# Patient Record
Sex: Male | Born: 1945 | Race: White | Hispanic: No | Marital: Married | Smoking: Former smoker
Health system: Southern US, Community
[De-identification: ages and names within clinical notes are randomized; demographics above are authoritative.]

## PROBLEM LIST (undated history)

## (undated) DIAGNOSIS — I10 Essential (primary) hypertension: Secondary | ICD-10-CM

## (undated) DIAGNOSIS — Z9289 Personal history of other medical treatment: Secondary | ICD-10-CM

## (undated) DIAGNOSIS — M109 Gout, unspecified: Secondary | ICD-10-CM

## (undated) DIAGNOSIS — I480 Paroxysmal atrial fibrillation: Secondary | ICD-10-CM

## (undated) DIAGNOSIS — E785 Hyperlipidemia, unspecified: Secondary | ICD-10-CM

## (undated) DIAGNOSIS — Z950 Presence of cardiac pacemaker: Secondary | ICD-10-CM

## (undated) DIAGNOSIS — I499 Cardiac arrhythmia, unspecified: Secondary | ICD-10-CM

## (undated) DIAGNOSIS — I509 Heart failure, unspecified: Secondary | ICD-10-CM

## (undated) HISTORY — PX: CARDIAC ELECTROPHYSIOLOGY STUDY AND ABLATION: SHX1294

## (undated) HISTORY — PX: TONSILLECTOMY: SUR1361

## (undated) HISTORY — PX: CARDIAC CATHETERIZATION: SHX172

## (undated) HISTORY — PX: LAPAROSCOPIC GASTRIC BYPASS: SUR771

## (undated) HISTORY — PX: LUMBAR LAMINECTOMY: SHX95

---

## 2014-07-03 DIAGNOSIS — I480 Paroxysmal atrial fibrillation: Secondary | ICD-10-CM | POA: Diagnosis present

## 2014-07-03 DIAGNOSIS — I10 Essential (primary) hypertension: Secondary | ICD-10-CM | POA: Diagnosis present

## 2016-03-14 ENCOUNTER — Encounter
Admission: RE | Admit: 2016-03-14 | Discharge: 2016-03-14 | Disposition: A | Discharge status: Home / Self Care | Payer: Medicare Other | Source: Ambulatory Visit | Attending: Cardiology | Admitting: Cardiology

## 2016-03-14 ENCOUNTER — Ambulatory Visit
Admission: RE | Admit: 2016-03-14 | Discharge: 2016-03-14 | Disposition: A | Discharge status: Home / Self Care | Payer: Medicare Other | Source: Ambulatory Visit | Attending: Cardiology | Admitting: Cardiology

## 2016-03-14 DIAGNOSIS — Z01818 Encounter for other preprocedural examination: Secondary | ICD-10-CM

## 2016-03-14 DIAGNOSIS — Z01812 Encounter for preprocedural laboratory examination: Secondary | ICD-10-CM | POA: Diagnosis present

## 2016-03-14 DIAGNOSIS — Z01810 Encounter for preprocedural cardiovascular examination: Secondary | ICD-10-CM | POA: Insufficient documentation

## 2016-03-14 DIAGNOSIS — R9431 Abnormal electrocardiogram [ECG] [EKG]: Secondary | ICD-10-CM | POA: Insufficient documentation

## 2016-03-14 HISTORY — DX: Essential (primary) hypertension: I10

## 2016-03-14 HISTORY — DX: Paroxysmal atrial fibrillation: I48.0

## 2016-03-14 HISTORY — DX: Hyperlipidemia, unspecified: E78.5

## 2016-03-14 HISTORY — DX: Gout, unspecified: M10.9

## 2016-03-14 LAB — DIFFERENTIAL
BASOS ABS: 0.1 10*3/uL (ref 0–0.1)
BASOS PCT: 1 %
Eosinophils Absolute: 0.2 10*3/uL (ref 0–0.7)
Eosinophils Relative: 3 %
Lymphocytes Relative: 25 %
Lymphs Abs: 1.4 10*3/uL (ref 1.0–3.6)
Monocytes Absolute: 0.5 10*3/uL (ref 0.2–1.0)
Monocytes Relative: 9 %
NEUTROS ABS: 3.6 10*3/uL (ref 1.4–6.5)
Neutrophils Relative %: 62 %

## 2016-03-14 LAB — SURGICAL PCR SCREEN
MRSA, PCR: NEGATIVE
Staphylococcus aureus: NEGATIVE

## 2016-03-14 LAB — CBC
HEMATOCRIT: 42.1 % (ref 40.0–52.0)
Hemoglobin: 14.1 g/dL (ref 13.0–18.0)
MCH: 37.5 pg — ABNORMAL HIGH (ref 26.0–34.0)
MCHC: 33.5 g/dL (ref 32.0–36.0)
MCV: 111.9 fL — ABNORMAL HIGH (ref 80.0–100.0)
PLATELETS: 158 10*3/uL (ref 150–440)
RBC: 3.76 MIL/uL — AB (ref 4.40–5.90)
RDW: 14.4 % (ref 11.5–14.5)
WBC: 5.7 10*3/uL (ref 3.8–10.6)

## 2016-03-14 LAB — BASIC METABOLIC PANEL
Anion gap: 5 (ref 5–15)
BUN: 27 mg/dL — AB (ref 6–20)
CALCIUM: 9.1 mg/dL (ref 8.9–10.3)
CO2: 27 mmol/L (ref 22–32)
CREATININE: 1.29 mg/dL — AB (ref 0.61–1.24)
Chloride: 110 mmol/L (ref 101–111)
GFR calc Af Amer: >60 mL/min (ref >60)
GFR, EST NON AFRICAN AMERICAN: 55 mL/min — AB (ref >60)
Glucose, Bld: 52 mg/dL — ABNORMAL LOW (ref 65–99)
Potassium: 4.5 mmol/L (ref 3.5–5.1)
SODIUM: 142 mmol/L (ref 135–145)

## 2016-03-14 LAB — PROTIME-INR
INR: 0.96
PROTHROMBIN TIME: 12.8 s (ref 11.4–15.2)

## 2016-03-14 LAB — APTT: APTT: 26 s (ref 24–36)

## 2016-03-14 NOTE — Patient Instructions (Signed)
Your procedure is scheduled on: Tuesday 03/22/16 Report to Day Surgery. 2ND FLOOR MEDICAL MALL ENTRANCE To find out your arrival time please call [phone] between 1PM - 3PM on Monday 03/21/16.  Remember: Instructions that are not followed completely may result in serious medical risk, up to and including death, or upon the discretion of your surgeon and anesthesiologist your surgery may need to be rescheduled.    __X__ 1. Do not eat food or drink liquids after midnight. No gum chewing or hard candies.     __X__ 2. No Alcohol for 24 hours before or after surgery.   ____ 3. Bring all medications with you on the day of surgery if instructed.    __X__ 4. Notify your doctor if there is any change in your medical condition     (cold, fever, infections).     Do not wear jewelry, make-up, hairpins, clips or nail polish.  Do not wear lotions, powders, or perfumes.   Do not shave 48 hours prior to surgery. Men may shave face and neck.  Do not bring valuables to the hospital.    [facility] is not responsible for any belongings or valuables.               Contacts, dentures or bridgework may not be worn into surgery.  Leave your suitcase in the car. After surgery it may be brought to your room.  For patients admitted to the hospital, discharge time is determined by your                treatment team.   Patients discharged the day of surgery will not be allowed to drive home.   Please read over the following fact sheets that you were given:   MRSA Information   __X__ Take these medicines the morning of surgery with A SIP OF WATER:    1. AMIODARONE  2. AMLODIPINE  3.   4.  5.  6.  ____ Fleet Enema (as directed)   __X__ Use CHG Soap as directed  ____ Use inhalers on the day of surgery  ____ Stop metformin 2 days prior to surgery    ____ Take 1/2 of usual insulin dose the night before surgery and none on the morning of surgery.   ____ Stop Coumadin/Plavix/aspirin on AS  INSTRUCTED BY DR PARASCHOS  __X__ Stop Anti-inflammatories on NO ADVIL UNTIL AFTER PROCEDURE   ____ Stop supplements until after surgery.  STOP SAW PALMETTO UNTIL AFTER PROCEDURE  ____ Bring C-Pap to the hospital.

## 2016-03-22 ENCOUNTER — Ambulatory Visit: Payer: Medicare Other | Admitting: Anesthesiology

## 2016-03-22 ENCOUNTER — Ambulatory Visit
Admission: RE | Admit: 2016-03-22 | Discharge: 2016-03-23 | Disposition: A | Discharge status: Home / Self Care | Payer: Medicare Other | Source: Ambulatory Visit | Attending: Cardiology | Admitting: Cardiology

## 2016-03-22 ENCOUNTER — Ambulatory Visit: Payer: Medicare Other

## 2016-03-22 ENCOUNTER — Observation Stay: Payer: Medicare Other

## 2016-03-22 ENCOUNTER — Encounter: Payer: Self-pay | Admitting: *Deleted

## 2016-03-22 ENCOUNTER — Encounter
Admission: RE | Disposition: A | Discharge status: Home / Self Care | Payer: Self-pay | Source: Ambulatory Visit | Attending: Cardiology

## 2016-03-22 DIAGNOSIS — Z9884 Bariatric surgery status: Secondary | ICD-10-CM | POA: Diagnosis not present

## 2016-03-22 DIAGNOSIS — Z7982 Long term (current) use of aspirin: Secondary | ICD-10-CM | POA: Insufficient documentation

## 2016-03-22 DIAGNOSIS — Z950 Presence of cardiac pacemaker: Secondary | ICD-10-CM

## 2016-03-22 DIAGNOSIS — I10 Essential (primary) hypertension: Secondary | ICD-10-CM | POA: Diagnosis not present

## 2016-03-22 DIAGNOSIS — E782 Mixed hyperlipidemia: Secondary | ICD-10-CM | POA: Insufficient documentation

## 2016-03-22 DIAGNOSIS — M109 Gout, unspecified: Secondary | ICD-10-CM | POA: Diagnosis not present

## 2016-03-22 DIAGNOSIS — R001 Bradycardia, unspecified: Secondary | ICD-10-CM | POA: Diagnosis present

## 2016-03-22 DIAGNOSIS — I480 Paroxysmal atrial fibrillation: Secondary | ICD-10-CM | POA: Insufficient documentation

## 2016-03-22 DIAGNOSIS — I495 Sick sinus syndrome: Secondary | ICD-10-CM | POA: Diagnosis not present

## 2016-03-22 DIAGNOSIS — F17210 Nicotine dependence, cigarettes, uncomplicated: Secondary | ICD-10-CM | POA: Diagnosis not present

## 2016-03-22 HISTORY — PX: PACEMAKER INSERTION: SHX728

## 2016-03-22 SURGERY — INSERTION, CARDIAC PACEMAKER
Anesthesia: Monitor Anesthesia Care

## 2016-03-22 MED ORDER — SIMVASTATIN 20 MG PO TABS
20.0000 mg | ORAL_TABLET | Freq: Every day | ORAL | Status: DC
  Administered 2016-03-22: 20 mg via ORAL
  Filled 2016-03-22: qty 1

## 2016-03-22 MED ORDER — AMIODARONE HCL 200 MG PO TABS
100.0000 mg | ORAL_TABLET | Freq: Every day | ORAL | Status: DC
  Filled 2016-03-22: qty 1
  Filled 2016-03-22: qty 0.5

## 2016-03-22 MED ORDER — MAGNESIUM OXIDE 400 (241.3 MG) MG PO TABS
400.0000 mg | ORAL_TABLET | Freq: Every day | ORAL | Status: DC
  Administered 2016-03-23: 400 mg via ORAL
  Filled 2016-03-22: qty 1

## 2016-03-22 MED ORDER — AMLODIPINE BESYLATE 5 MG PO TABS
5.0000 mg | ORAL_TABLET | Freq: Every day | ORAL | Status: DC
  Administered 2016-03-23: 5 mg via ORAL
  Filled 2016-03-22: qty 1

## 2016-03-22 MED ORDER — SODIUM CHLORIDE 0.9 % IR SOLN
Freq: Once | Status: DC
  Filled 2016-03-22: qty 2

## 2016-03-22 MED ORDER — SODIUM CHLORIDE 0.9 % IR SOLN
Status: DC | PRN
  Administered 2016-03-22: 2 mL

## 2016-03-22 MED ORDER — ACETAMINOPHEN 325 MG PO TABS
325.0000 mg | ORAL_TABLET | ORAL | Status: DC | PRN
  Administered 2016-03-22 – 2016-03-23 (×3): 650 mg via ORAL
  Filled 2016-03-22 (×3): qty 2

## 2016-03-22 MED ORDER — LIDOCAINE 1 % OPTIME INJ - NO CHARGE
INTRAMUSCULAR | Status: DC | PRN
  Administered 2016-03-22: 30 mL via INTRADERMAL

## 2016-03-22 MED ORDER — LACTATED RINGERS IV SOLN
INTRAVENOUS | Status: DC
  Administered 2016-03-22: 75 mL/h via INTRAVENOUS
  Administered 2016-03-22: 15:00:00 via INTRAVENOUS

## 2016-03-22 MED ORDER — ONDANSETRON HCL 4 MG/2ML IJ SOLN: 4.0000 mg | Freq: Once | INTRAMUSCULAR | Status: DC | PRN

## 2016-03-22 MED ORDER — PROPOFOL 500 MG/50ML IV EMUL
INTRAVENOUS | Status: DC | PRN
  Administered 2016-03-22: 75 ug/kg/min via INTRAVENOUS

## 2016-03-22 MED ORDER — CEFAZOLIN IN D5W 1 GM/50ML IV SOLN
INTRAVENOUS | Status: AC
  Filled 2016-03-22: qty 50

## 2016-03-22 MED ORDER — FENTANYL CITRATE (PF) 100 MCG/2ML IJ SOLN
INTRAMUSCULAR | Status: DC | PRN
  Administered 2016-03-22: 25 ug via INTRAVENOUS
  Administered 2016-03-22: 50 ug via INTRAVENOUS
  Administered 2016-03-22: 25 ug via INTRAVENOUS

## 2016-03-22 MED ORDER — CEFAZOLIN IN D5W 1 GM/50ML IV SOLN: 1.0000 g | Freq: Once | INTRAVENOUS | Status: DC

## 2016-03-22 MED ORDER — FENTANYL CITRATE (PF) 100 MCG/2ML IJ SOLN: 25.0000 ug | INTRAMUSCULAR | Status: DC | PRN

## 2016-03-22 MED ORDER — ONDANSETRON HCL 4 MG/2ML IJ SOLN: 4.0000 mg | Freq: Four times a day (QID) | INTRAMUSCULAR | Status: DC | PRN

## 2016-03-22 MED ORDER — PROPOFOL 10 MG/ML IV BOLUS
INTRAVENOUS | Status: DC | PRN
  Administered 2016-03-22: 20 mg via INTRAVENOUS
  Administered 2016-03-22: 40 mg via INTRAVENOUS

## 2016-03-22 MED ORDER — MIDAZOLAM HCL 2 MG/2ML IJ SOLN
INTRAMUSCULAR | Status: DC | PRN
  Administered 2016-03-22: 2 mg via INTRAVENOUS

## 2016-03-22 MED ORDER — SODIUM CHLORIDE FLUSH 0.9 % IV SOLN
INTRAVENOUS | Status: AC
  Administered 2016-03-22: 3 mL
  Filled 2016-03-22: qty 3

## 2016-03-22 MED ORDER — GENTAMICIN SULFATE 40 MG/ML IJ SOLN
INTRAMUSCULAR | Status: AC
  Filled 2016-03-22: qty 2

## 2016-03-22 MED ORDER — FAMOTIDINE 20 MG PO TABS
ORAL_TABLET | ORAL | Status: AC
  Administered 2016-03-22: 20 mg
  Filled 2016-03-22: qty 1

## 2016-03-22 MED ORDER — LACTATED RINGERS IV SOLN
INTRAVENOUS | Status: DC | PRN
  Administered 2016-03-22: 12:00:00 via INTRAVENOUS

## 2016-03-22 MED ORDER — SODIUM CHLORIDE FLUSH 0.9 % IV SOLN
INTRAVENOUS | Status: AC
  Filled 2016-03-22: qty 10

## 2016-03-22 MED ORDER — FAMOTIDINE 20 MG PO TABS: 20.0000 mg | ORAL_TABLET | Freq: Once | ORAL | Status: DC

## 2016-03-22 MED ORDER — CEFAZOLIN IN D5W 1 GM/50ML IV SOLN
1.0000 g | Freq: Four times a day (QID) | INTRAVENOUS | Status: AC
Start: 2016-03-22 — End: 2016-03-23
  Administered 2016-03-22 – 2016-03-23 (×3): 1 g via INTRAVENOUS
  Filled 2016-03-22 (×3): qty 50

## 2016-03-22 MED ORDER — LISINOPRIL 20 MG PO TABS
40.0000 mg | ORAL_TABLET | Freq: Every day | ORAL | Status: DC
  Administered 2016-03-22 – 2016-03-23 (×2): 40 mg via ORAL
  Filled 2016-03-22 (×2): qty 2

## 2016-03-22 SURGICAL SUPPLY — 36 items
BAG DECANTER FOR FLEXI CONT (MISCELLANEOUS) ×2 IMPLANT
BRUSH SCRUB 4% CHG (MISCELLANEOUS) ×2 IMPLANT
CABLE SURG 12 DISP A/V CHANNEL (MISCELLANEOUS) ×2 IMPLANT
CANISTER SUCT 1200ML W/VALVE (MISCELLANEOUS) ×2 IMPLANT
CHLORAPREP W/TINT 26ML (MISCELLANEOUS) ×2 IMPLANT
COVER LIGHT HANDLE STERIS (MISCELLANEOUS) ×4 IMPLANT
COVER MAYO STAND STRL (DRAPES) ×2 IMPLANT
DEVICE DISSECT PLASMABLAD 3.0S (MISCELLANEOUS) IMPLANT
DRAPE C-ARM XRAY 36X54 (DRAPES) ×2 IMPLANT
DRESSING TELFA 4X3 1S ST N-ADH (GAUZE/BANDAGES/DRESSINGS) ×2 IMPLANT
DRSG TEGADERM 4X4.75 (GAUZE/BANDAGES/DRESSINGS) ×2 IMPLANT
ELECT REM PT RETURN 9FT ADLT (ELECTROSURGICAL) ×2
ELECTRODE REM PT RTRN 9FT ADLT (ELECTROSURGICAL) ×1 IMPLANT
GLIDEWIRE STIFF .35X180X3 HYDR (WIRE) ×2 IMPLANT
GLOVE BIO SURGEON STRL SZ7.5 (GLOVE) ×2 IMPLANT
GLOVE BIO SURGEON STRL SZ8 (GLOVE) ×2 IMPLANT
GOWN STRL REUS W/ TWL LRG LVL3 (GOWN DISPOSABLE) ×1 IMPLANT
GOWN STRL REUS W/ TWL XL LVL3 (GOWN DISPOSABLE) ×1 IMPLANT
GOWN STRL REUS W/TWL LRG LVL3 (GOWN DISPOSABLE) ×1
GOWN STRL REUS W/TWL XL LVL3 (GOWN DISPOSABLE) ×1
IMMOBILIZER SHDR MD LX WHT (SOFTGOODS) ×2 IMPLANT
IMMOBILIZER SHDR XL LX WHT (SOFTGOODS) IMPLANT
INTRO PACEMKR SHEATH II 7FR (MISCELLANEOUS) ×2
INTRODUCER PACEMKR SHTH II 7FR (MISCELLANEOUS) ×1 IMPLANT
IV NS 500ML (IV SOLUTION) ×1
IV NS 500ML BAXH (IV SOLUTION) ×1 IMPLANT
KIT RM TURNOVER STRD PROC AR (KITS) ×2 IMPLANT
LABEL OR SOLS (LABEL) ×2 IMPLANT
LEAD CAPSURE NOVUS 5076-52CM (Lead) ×2 IMPLANT
LEAD CAPSURE NOVUS 5076-58CM (Lead) ×2 IMPLANT
MARKER SKIN DUAL TIP RULER LAB (MISCELLANEOUS) ×2 IMPLANT
PACK PACE INSERTION (MISCELLANEOUS) ×2 IMPLANT
PAD ONESTEP ZOLL R SERIES ADT (MISCELLANEOUS) ×2 IMPLANT
PLASMABLADE 3.0S (MISCELLANEOUS)
PPM ADVISA MRI DR A2DR01 (Pacemaker) ×2 IMPLANT
SUT SILK 0 SH 30 (SUTURE) ×6 IMPLANT

## 2016-03-22 NOTE — H&P (Signed)
<6>29299-5<7>Encounter Details<6>46240-8<7>Social History<6>29762-2<7>Instructions<6>69730-0<7>Progress Notes<6>10164-2<7>Plan of Treatment<6>18776-5<7>Visit Diagnoses<6>51848-0<7>/"> Jump to Section ? Document InformationEncounter DetailsInstructionsPatient DemographicsPlan of TreatmentProgress NotesReason for VisitSocial HistoryVisit Diagnoses Mike Townsend Encounter Summary, generated on Nov. 06, 2017 Printout Information  Document Contents Initial consult Document Received Date Nov. 06, 2017 Document Source Organization [facility]   Patient Demographics - 70 y.o. Male, born 06-17-1945   Patient Address Communication Language Race / Ethnicity  [address] [place], [place] [id number] [facility]) [phone] (Home) [email or web address] English (Preferred) White / Not Hispanic or Latino  Reason for Visit    Reason Comments  Establish Care Per Paul Half ABN Holter  Shortness of Breath with or without excertion sometime  Edema with standing all day his normal  Dizziness pre syncope-when walking  Fatigue heart rate slow    Consultation (Routine) Consultation (Routine)  Status Reason Specialty Diagnoses / Procedures Referred By Contact Referred To Contact  Pending Review  Cardiovascular Disease / Cardiology Diagnoses  Symptomatic bradycardia   Carlynn Purl, MD  [address]  [facility]  [place], [place] [address]  Phone: [phone]  Fax: [phone]  [facility] - Cardiology  [address]  [place], [place] [address]  Phone: [phone]  Fax: [phone]      Encounter Details    Date Type Department Care Team Description  03/03/2016 Initial consult [facility]  [address]  [place], [place] [address]  [phone]  Denton Ar, MD  [address]  [facility] [address] - CARDIOLOGY  [place], [place] [address]  [phone]    [phone] (Fax)    Marcina Millard, MD  [address]  [facility]  [place], [place] [address]  [phone]  [phone] (Fax)  Paroxysmal atrial fibrillation (CMS-HCC) (Primary Dx);  Essential hypertension;  Hyperlipidemia, mixed;  Sinus bradycardia   Social History - as of this encounter   Tobacco Use Types Packs/Day Years Used Date  Current Some Day Smoker Pipe     Smokeless Tobacco: Never Used      Alcohol Use Drinks/Week oz/Week Comments  No 0 Standard drinks or equivalent  0.0     Sex Assigned at [employer] Date Recorded  Not on file    Instructions - in this encounter   Patient Instructions - Marcina Millard, MD - 03/03/2016 10:00 AM EDT Pacemaker Implantation The heart has its own electrical system, or natural pacemaker, to regulate the heartbeat. Sometimes, the natural pacemaker system of the heart fails and causes the heart to beat too slowly. If this happens, a pacemaker can be surgically placed to help the heart beat at a normal or programmed rate. A pacemaker is a small, battery-powered device that is placed under the skin and is programmed to sense your heartbeats. If your heart rate is lower than the programmed rate, the pacemaker will pace your heart. Parts of a pacemaker include:  Wires or leads. The leads are placed in the heart and transmit electricity to the heart. The leads are connected to the pulse generator.  Pulse generator. The pulse generator contains a computer and a memory system. The pulse generator also produces the electrical signal that triggers the heart to beat. A pacemaker may be placed if:  You have a slow heartbeat (bradycardia).  You have fainting (syncope).  Shortness of breath (dyspnea) due to heart problems. LET [facility] CARE PROVIDER KNOW ABOUT:  Any allergies you may have.  All medicines you are taking, including vitamins, herbs, eye drops, creams, and  over-the-counter  medicines.  Previous problems you or members of your family have had with the use of anesthetics.  Any blood disorders you have.  Previous surgeries you have had.  Medical conditions you have.  Possibility of pregnancy, if this applies. RISKS AND COMPLICATIONS Generally, pacemaker implantation is a safe procedure. However, problems can occur and include:  Bleeding.  Unable to place the pacemaker under local sedation.  Infection. BEFORE THE PROCEDURE  You will have blood work drawn before the procedure.  Do not use any tobacco products including cigarettes, chewing tobacco, or electronic cigarettes. If you need help quitting, ask your health care provider.  Do not eat or drink anything after midnight on the night before the procedure or as directed by your health care provider.  Ask your health care provider about:  Changing or stopping your regular medicines. This is especially important if you are taking diabetes medicines or blood thinners.  Taking medicines such as aspirin and ibuprofen. These medicines can thin your blood. Do not take these medicines before your procedure if your health care provider asks you not to.  Ask your health care provider if you can take a sip of water with any approved medicines the morning of the procedure. PROCEDURE  The surgery to place a pacemaker is considered a minimally invasive surgical procedure. It is done under a local anesthetic, which is an injection at the incision site that makes the skin numb. You are also given sedation and pain medicine that makes you drowsy during the procedure.   An intravenous line (IV) will be started in your hand or arm so sedation and pain medicine can be given during the pacemaker procedure.  A numbing medicine will be injected into the skin where the pacemaker is to be placed. A small incision will then be made into the skin. The pacemaker is usually placed under the skin near the collarbone.  After the  incision has been made, the leads will be inserted into a large vein and guided into the heart using X-ray.  Using the same incision that was used to place the leads, a small pocket will be created under the skin to hold the pulse generator. The leads will then be connected to the pulse generator.  The incision site will then be closed. A bandage (dressing) is placed over the pacemaker site. The dressing is removed 24-48 hours afterward. AFTER THE PROCEDURE  You will be taken to a recovery area after the pacemaker implant. Your vital signs such as blood pressure, heart rate, breathing, and oxygen levels will be monitored.  A chest X-ray will be done after the pacemaker has been implanted. This is to make sure the pacemaker and leads are in the correct place.  This information is not intended to replace advice given to you by your health care provider. Make sure you discuss any questions you have with your health care provider.  Document Released: 04/15/2002 Document Revised: 05/16/2014 Document Reviewed: 08/30/2011 Elsevier Interactive Patient Education 2017 [employer]     Progress Notes - in this encounter   Marcina MillardParaschos, Lysa Livengood, MD - 03/03/2016 10:00 AM EDT Formatting of this note may be different from the original. New Patient Visit   Chief Complaint: Chief Complaint  Patient presents with  . Establish Care  Per Paul HalfM Miller ABN Holter  . Shortness of Breath  with or without excertion sometime  . Edema  with standing all day his normal  . Dizziness  pre syncope-when walking  .  Fatigue  heart rate slow  Date of Service: 03/03/2016 Date of Birth: [birth year] PCP: Danella Penton, MD  History of Present Illness: Mr. Rote is a 70 y.o.male patient who referred for symptomatic bradycardia, history of paroxysmal atrial fibrillation, essential hypertension and hyperlipidemia. Patient reports a history of paroxysmal atrial fibrillation diagnosed approximately 25 years ago, on  chronic amiodarone therapy. He reports a 6-12 month history of worsening exertional dyspnea, intermittent dizziness, and generalized fatigue 24-hour Holter monitor 02/03/2016 revealed predominant sinus bradycardia, with mean heart rate of 48 bpm, with occasional premature atrial contractions. Amiodarone was decreased from 200 to 100 mg approximately 2 weeks ago without any clinical improvement.  The patient has essential hypertension, blood pressure well controlled, on lisinopril and amlodipine, which are well tolerated without apparent side effects. The patient follows a low-sodium, no added salt diet  The patient has hyperlipidemia, on simvastatin, which is well tolerated without apparent side effects, followed by his primary care provider. The patient follows a low-cholesterol, low-fat diet.  Past Medical and Surgical History  Past Medical History Past Medical History:  Diagnosis Date  . Chronic gouty arthropathy without tophi 07/03/2014  . Essential hypertension 07/03/2014  . Hyperlipidemia, mixed 07/03/2014  . Paroxysmal atrial fibrillation (CMS-HCC) 07/03/2014   Past Surgical History He has a past surgical history that includes laminectomy lumbar spine and Laparoscopic gastric bypass.   Medications and Allergies  Current Medications Current Outpatient Prescriptions  Medication Sig Dispense Refill  . allopurinol (ZYLOPRIM) 300 MG tablet TAKE 1 TABLET BY MOUTH ONCE DAILY. 30 tablet 11  . AMIOdarone (PACERONE) 200 MG tablet Take 100 mg by mouth once daily.  Marland Kitchen amLODIPine (NORVASC) 5 MG tablet Take 1 tablet (5 mg total) by mouth once daily. 30 tablet 11  . aspirin 81 MG EC tablet Take 81 mg by mouth once daily.  Marland Kitchen lisinopril (PRINIVIL,ZESTRIL) 40 MG tablet Take 20 mg by mouth nightly.  . magnesium oxide (MAG-OX) 400 mg tablet Take 400 mg by mouth once daily.  . simvastatin (ZOCOR) 20 MG tablet TAKE 1 TABLET BY MOUTH AT BEDTIME 30 tablet 11   No current facility-administered medications for  this visit.   Allergies Review of patient's allergies indicates no known allergies.  Social and Family History  Social History reports that he has been smoking Pipe. He has never used smokeless tobacco. He reports that he does not drink alcohol or use drugs.  Family History family history includes Coronary artery disease in his mother; No Known Problems in his father.   Review of Systems   Review of Systems: The patient denies chest pain, reports progressive exertional shortness of breath, without orthopnea, paroxysmal nocturnal dyspnea, pedal edema, palpitations, heart racing, with presyncope, without syncope. Review of 12 Systems is negative except as described above.  Physical Examination   Vitals:BP (P) 138/62  Pulse (!) (P) 48  Ht (P) 188 cm (6\' 2" )  Wt (!) (P) 109.3 kg (241 lb)  BMI (P) 30.94 kg/m  Ht:(P) 188 cm (6\' 2" ) Wt:(!) (P) 109.3 kg (241 lb) GEX:BMWU surface area is 2.39 meters squared (pended). Body mass index is 30.94 kg/m (pended).  HEENT: Pupils equally reactive to light and accomodation  Neck: Supple without thyromegaly, carotid pulses 2+ Lungs: clear to auscultation bilaterally; no wheezes, rales, rhonchi Heart: Regular rate and rhythm. No gallops, murmurs or rub Abdomen: soft nontender, nondistended, with normal bowel sounds Extremities: no cyanosis, clubbing, or edema Peripheral Pulses: 2+ in all extremities, 2+ femoral pulses bilaterally  Assessment  70 y.o. male with  1. Paroxysmal atrial fibrillation (CMS-HCC)  2. Essential hypertension  3. Hyperlipidemia, mixed  4. Sinus bradycardia   70 year old gentleman with symptomatic bradycardia, with progressive exertional dyspnea, postural dizziness, and generalized fatigue, with mean heart rate of 48 bpm on Holter monitor, with history of paroxysmal atrial fibrillation, consistent with sick sinus syndrome, and tachybradycardia syndrome. Patient has essential hypertension, blood pressure well controlled on  current BP medications.  Plan   1. Continue current medications 2. Counseled patient about low-sodium diet 3. DASH diet printed instructions given to the patient 4. Counseled patient about low-cholesterol diet 5. Continue simvastatin for hyperlipidemia management  6. Low-fat and cholesterol diet printed instructions given to the patient 7. Continue amiodarone 100 mg daily 8. Proceed with dual-chamber pacemaker implantation. We discussed the risks, benefits alternatives of permanent pacemaker implantation, and informed consent was obtained.  No orders of the defined types were placed in this encounter.  Return in about 2 weeks (around 03/17/2016), or after pacemaker.  Marcina MillardALEXANDER Audrick Lamoureaux, MD      Plan of Treatment - as of this encounter   Upcoming Encounters Upcoming Encounters  Date Type Specialty Care Team Description  07/07/2016 Ancillary Orders Lab Carlynn PurlMiller, Mark Frederic, MD  [address]  [facility]  [place], [place] [address]  [phone]  [phone] (Fax)    07/14/2016 Office Visit Internal Medicine Carlynn PurlMiller, Mark Frederic, MD  [address]  [facility]  [place], [place] [address]  [phone]  [phone] (Fax)     Visit Diagnoses    Diagnosis  Paroxysmal atrial fibrillation (CMS-HCC) - Primary  Atrial fibrillation   Essential hypertension  Hyperlipidemia, mixed  Mixed hyperlipidemia   Sinus bradycardia  Other specified cardiac dysrhythmias    Images Document Information   Service Providers Document Coverage Dates Oct. 26, 2017  Custodian Organization [facility] [phone] (Work) [place], [place] [id number]   Encounter Providers Marcina MillardAlexander Diamonds Lippard MD (Attending) tel:[phone] (Work) [phone] [address] [facility] [place], [place] [address]   Encounter Date Oct. 26, 2017   Show All Sections

## 2016-03-22 NOTE — Care Management (Signed)
Elective pacemaker insertion.  No complications. outpatient in bed. Do not anticipate any discharge needs.

## 2016-03-22 NOTE — Op Note (Signed)
[facility]   03/22/2016                     1:23 PM  PATIENT:  Mike Townsend    PRE-OPERATIVE DIAGNOSIS:  Paroxysmal atrial fibrillation and sinus bradycardia  POST-OPERATIVE DIAGNOSIS:  Same  PROCEDURE:  INSERTION PACEMAKER  SURGEON:  Marcina MillardAlexander Danaka Llera, MD    ANESTHESIA:     PREOPERATIVE INDICATIONS:  Mike Townsend is a  70 y.o. male with a diagnosis of Paroxysmal atrial fibrillation and sinus bradycardia who failed conservative measures and elected for surgical management.    The risks benefits and alternatives were discussed with the patient preoperatively including but not limited to the risks of infection, bleeding, cardiopulmonary complications, the need for revision surgery, among others, and the patient was willing to proceed.   OPERATIVE PROCEDURE: The patient was brought to the operating room the fasting state. The left pectoral region was prepped and draped in the usual sterile manner. Anesthesia was obtained 1% lidocaine locally. A 6 cm incision was performed over the left pectoral region. The pacemaker pocket was generated by electrocautery and blunt dissection. Access was obtained to left subclavian vein by fine needle aspiration. MRI compatible leads were positioned into the right ventricular apical septum and right atrial appendage and fluoroscopic guidance. After proper thresholds were obtained the leads were sutured in place. The pacemaker pocket was irrigated with gentamicin solution. The leads were connected to a MRI compatible dual-chamber pacemaker generator (Medtronic A2 DRO 1), and positioned into the pocket. The pocket was closed with 2-0 and 4-0 Vicryl, respectively. Steri-Strips and a pressure dressing were applied.

## 2016-03-22 NOTE — Anesthesia Preprocedure Evaluation (Signed)
Anesthesia Evaluation  Patient identified by MRN, date of birth, ID band Patient awake    Reviewed: Allergy & Precautions, NPO status , Patient's Chart, lab work & pertinent test results  Airway Mallampati: II       Dental  (+) Teeth Intact   Pulmonary neg pulmonary ROS, Current Smoker,    breath sounds clear to auscultation       Cardiovascular Exercise Tolerance: Good hypertension, Pt. on medications + dysrhythmias Atrial Fibrillation  Rhythm:Regular Rate:Normal     Neuro/Psych    GI/Hepatic negative GI ROS, Neg liver ROS,   Endo/Other  negative endocrine ROS  Renal/GU negative Renal ROS     Musculoskeletal negative musculoskeletal ROS (+)   Abdominal   Peds  Hematology negative hematology ROS (+)   Anesthesia Other Findings   Reproductive/Obstetrics                             Anesthesia Physical Anesthesia Plan  ASA: II  Anesthesia Plan: MAC   Post-op Pain Management:    Induction: Intravenous  Airway Management Planned: Natural Airway and Nasal Cannula  Additional Equipment:   Intra-op Plan:   Post-operative Plan:   Informed Consent: I have reviewed the patients History and Physical, chart, labs and discussed the procedure including the risks, benefits and alternatives for the proposed anesthesia with the patient or authorized representative who has indicated his/her understanding and acceptance.     Plan Discussed with: CRNA  Anesthesia Plan Comments:         Anesthesia Quick Evaluation

## 2016-03-22 NOTE — Transfer of Care (Signed)
Immediate Anesthesia Transfer of Care Note  Patient: Mike Townsend  Procedure(s) Performed: Procedure(s): INSERTION PACEMAKER (N/A)  Patient Location: PACU  Anesthesia Type:General  Level of Consciousness: sedated  Airway & Oxygen Therapy: Patient Spontanous Breathing and Patient connected to face mask oxygen  Post-op Assessment: Report given to RN and Post -op Vital signs reviewed and stable  Post vital signs: Reviewed and stable  Last Vitals:  Vitals:   03/22/16 1337 03/22/16 1345  BP: 133/81   Pulse: 60 61  Resp: (!) 0 16  Temp:      Complications: No apparent anesthesia complications

## 2016-03-23 DIAGNOSIS — I495 Sick sinus syndrome: Secondary | ICD-10-CM | POA: Diagnosis not present

## 2016-03-23 MED ORDER — CEPHALEXIN 250 MG PO CAPS: 250.0000 mg | ORAL_CAPSULE | Freq: Four times a day (QID) | ORAL | 0 refills | Status: DC

## 2016-03-23 NOTE — Discharge Summary (Signed)
Physician Discharge Summary  Patient ID: Mike Townsend Dick MRN: [id number] DOB/AGE: Oct 28, 1945 70 y.o.  Admit date: 03/22/2016 Discharge date: 03/23/2016  Primary Discharge Diagnosis Sick sinus syndrome Secondary Discharge Diagnosis atrial fibrillation  Significant Diagnostic Studies: yes  Consults: None  Hospital Course: The patient underwent elective dual-chamber pacemaker implantation on 03/22/2016. Patient was returned to telemetry where he had an uncomplicated hospital course. The patient was ambulating without difficulty and was discharged home.   Discharge Exam: Blood pressure (!) 143/78, pulse (!) 58, temperature 97.7 F (36.5 C), temperature source Oral, resp. rate 18, height 6\' 2"  (1.88 m), weight 108.9 kg (240 lb), SpO2 100 %.   General appearance: alert Head: Normocephalic, without obvious abnormality, atraumatic Eyes: conjunctivae/corneas clear. PERRL, EOM's intact. Fundi benign. Ears: normal TM's and external ear canals both ears Nose: Nares normal. Septum midline. Mucosa normal. No drainage or sinus tenderness. Throat: lips, mucosa, and tongue normal; teeth and gums normal Neck: no adenopathy, no carotid bruit, no JVD, supple, symmetrical, trachea midline and thyroid not enlarged, symmetric, no tenderness/mass/nodules Back: symmetric, no curvature. ROM normal. No CVA tenderness. Resp: clear to auscultation bilaterally Chest wall: no tenderness Cardio: regular rate and rhythm, S1, S2 normal, no murmur, click, rub or gallop GI: soft, non-tender; bowel sounds normal; no masses,  no organomegaly Pelvic: cervix normal in appearance, external genitalia normal, no adnexal masses or tenderness, no cervical motion tenderness, rectovaginal septum normal, uterus normal size, shape, and consistency and vagina normal without discharge Extremities: extremities normal, atraumatic, no cyanosis or edema Pulses: 2+ and symmetric Skin: Skin color, texture, turgor normal. No rashes or  lesions Neurologic: Grossly normal Labs:   Lab Results  Component Value Date   WBC 5.7 03/14/2016   HGB 14.1 03/14/2016   HCT 42.1 03/14/2016   MCV 111.9 (H) 03/14/2016   PLT 158 03/14/2016   No results for input(s): NA, K, CL, CO2, BUN, CREATININE, CALCIUM, PROT, BILITOT, ALKPHOS, ALT, AST, GLUCOSE in the last 168 hours.  Invalid input(s): LABALBU    Radiology: No pnemothorax EKG:   FOLLOW UP PLANS AND APPOINTMENTS    Medication List    TAKE these medications   allopurinol 300 MG tablet Commonly known as:  ZYLOPRIM Take 300 mg by mouth daily.   amiodarone 200 MG tablet Commonly known as:  PACERONE Take 100 mg by mouth daily.   amLODipine 5 MG tablet Commonly known as:  NORVASC Take 5 mg by mouth daily.   aspirin EC 81 MG tablet Take 81 mg by mouth daily.   cephALEXin 250 MG capsule Commonly known as:  KEFLEX Take 1 capsule (250 mg total) by mouth 4 (four) times daily.   ibuprofen 200 MG tablet Commonly known as:  ADVIL,MOTRIN Take 200-400 mg by mouth every 8 (eight) hours as needed (for pain.).   lisinopril 40 MG tablet Commonly known as:  PRINIVIL,ZESTRIL Take 40 mg by mouth at bedtime.   magnesium oxide 400 MG tablet Commonly known as:  MAG-OX Take 400 mg by mouth daily.   SAW PALMETTO PO Take 1 capsule by mouth daily.   simvastatin 20 MG tablet Commonly known as:  ZOCOR Take 20 mg by mouth at bedtime.      Follow-up Information    Marcina MillardAlexander Elisabeth Strom, MD Follow up in 10 day(s).   Specialty:  Cardiology Contact information: [address] [facility] [place] [address] [phone]           BRING ALL MEDICATIONS WITH YOU TO FOLLOW UP APPOINTMENTS  Time spent with patient to include physician time: 25 min Signed:  Marcina MillardAlexander Rashon Westrup MD, PhD, [facility] 03/23/2016, 8:00 AM

## 2016-03-23 NOTE — Progress Notes (Signed)
Discharge teaching given to pt. And spouse at bedside. IV removed x2 without diffiulty via student nurse with this nurse at bedside. Prescription given to spouse. Pt. States he will take amiodarone dose when he gets home, nurse waiting for dose to arrive from pharmacy.

## 2016-03-23 NOTE — Discharge Instructions (Signed)
May remove outer dressing 03/24/2016. Leave Steri-Strips. May shower 03/24/2016. Do not lift left arm above head.

## 2016-03-23 NOTE — Anesthesia Postprocedure Evaluation (Signed)
Anesthesia Post Note  Patient: [name]  Procedure(s) Performed: Procedure(s) (LRB): INSERTION PACEMAKER (N/A)  Patient location during evaluation: PACU Anesthesia Type: MAC Level of consciousness: awake Pain management: pain level controlled Vital Signs Assessment: post-procedure vital signs reviewed and stable Respiratory status: spontaneous breathing Cardiovascular status: stable Anesthetic complications: no    Last Vitals:  Vitals:   03/22/16 1955 03/23/16 0559  BP: 125/72 (!) 143/78  Pulse: (!) 59 (!) 58  Resp: 18 18  Temp: 36.7 C 36.5 C    Last Pain:  Vitals:   03/23/16 0559  TempSrc: Oral  PainSc:                  VAN Townsend,Mike Fait

## 2018-10-19 ENCOUNTER — Other Ambulatory Visit
Admission: RE | Admit: 2018-10-19 | Discharge: 2018-10-19 | Disposition: A | Discharge status: Home / Self Care | Payer: Medicare HMO | Source: Ambulatory Visit | Attending: Physician Assistant | Admitting: Physician Assistant

## 2018-10-19 ENCOUNTER — Other Ambulatory Visit: Payer: Self-pay

## 2018-10-19 DIAGNOSIS — Z1159 Encounter for screening for other viral diseases: Secondary | ICD-10-CM | POA: Diagnosis not present

## 2018-10-19 DIAGNOSIS — Z01812 Encounter for preprocedural laboratory examination: Secondary | ICD-10-CM | POA: Diagnosis present

## 2018-10-20 ENCOUNTER — Other Ambulatory Visit: Payer: Self-pay

## 2018-10-20 ENCOUNTER — Encounter: Payer: Self-pay | Admitting: *Deleted

## 2018-10-20 ENCOUNTER — Inpatient Hospital Stay
Admission: EM | Admit: 2018-10-20 | Discharge: 2018-10-25 | DRG: 281 | Disposition: A | Discharge status: Home / Self Care | Payer: Medicare HMO | Attending: Internal Medicine | Admitting: Internal Medicine

## 2018-10-20 ENCOUNTER — Emergency Department: Payer: Medicare HMO

## 2018-10-20 DIAGNOSIS — I25110 Atherosclerotic heart disease of native coronary artery with unstable angina pectoris: Secondary | ICD-10-CM | POA: Diagnosis present

## 2018-10-20 DIAGNOSIS — I495 Sick sinus syndrome: Secondary | ICD-10-CM | POA: Diagnosis present

## 2018-10-20 DIAGNOSIS — F17200 Nicotine dependence, unspecified, uncomplicated: Secondary | ICD-10-CM | POA: Diagnosis present

## 2018-10-20 DIAGNOSIS — Z8249 Family history of ischemic heart disease and other diseases of the circulatory system: Secondary | ICD-10-CM

## 2018-10-20 DIAGNOSIS — Z7901 Long term (current) use of anticoagulants: Secondary | ICD-10-CM

## 2018-10-20 DIAGNOSIS — I482 Chronic atrial fibrillation, unspecified: Secondary | ICD-10-CM | POA: Diagnosis present

## 2018-10-20 DIAGNOSIS — Z9289 Personal history of other medical treatment: Secondary | ICD-10-CM

## 2018-10-20 DIAGNOSIS — N183 Chronic kidney disease, stage 3 (moderate): Secondary | ICD-10-CM | POA: Diagnosis present

## 2018-10-20 DIAGNOSIS — R079 Chest pain, unspecified: Secondary | ICD-10-CM | POA: Diagnosis not present

## 2018-10-20 DIAGNOSIS — I313 Pericardial effusion (noninflammatory): Secondary | ICD-10-CM | POA: Diagnosis present

## 2018-10-20 DIAGNOSIS — D631 Anemia in chronic kidney disease: Secondary | ICD-10-CM | POA: Diagnosis present

## 2018-10-20 DIAGNOSIS — E538 Deficiency of other specified B group vitamins: Secondary | ICD-10-CM | POA: Diagnosis present

## 2018-10-20 DIAGNOSIS — D539 Nutritional anemia, unspecified: Secondary | ICD-10-CM | POA: Diagnosis present

## 2018-10-20 DIAGNOSIS — R9430 Abnormal result of cardiovascular function study, unspecified: Secondary | ICD-10-CM

## 2018-10-20 DIAGNOSIS — Z9884 Bariatric surgery status: Secondary | ICD-10-CM

## 2018-10-20 DIAGNOSIS — I480 Paroxysmal atrial fibrillation: Secondary | ICD-10-CM | POA: Diagnosis present

## 2018-10-20 DIAGNOSIS — D696 Thrombocytopenia, unspecified: Secondary | ICD-10-CM | POA: Diagnosis present

## 2018-10-20 DIAGNOSIS — E785 Hyperlipidemia, unspecified: Secondary | ICD-10-CM | POA: Diagnosis present

## 2018-10-20 DIAGNOSIS — M109 Gout, unspecified: Secondary | ICD-10-CM | POA: Diagnosis present

## 2018-10-20 DIAGNOSIS — K760 Fatty (change of) liver, not elsewhere classified: Secondary | ICD-10-CM | POA: Diagnosis present

## 2018-10-20 DIAGNOSIS — I214 Non-ST elevation (NSTEMI) myocardial infarction: Secondary | ICD-10-CM | POA: Diagnosis not present

## 2018-10-20 DIAGNOSIS — I200 Unstable angina: Secondary | ICD-10-CM

## 2018-10-20 DIAGNOSIS — K219 Gastro-esophageal reflux disease without esophagitis: Secondary | ICD-10-CM | POA: Diagnosis present

## 2018-10-20 DIAGNOSIS — Z950 Presence of cardiac pacemaker: Secondary | ICD-10-CM

## 2018-10-20 DIAGNOSIS — N179 Acute kidney failure, unspecified: Secondary | ICD-10-CM

## 2018-10-20 DIAGNOSIS — I129 Hypertensive chronic kidney disease with stage 1 through stage 4 chronic kidney disease, or unspecified chronic kidney disease: Secondary | ICD-10-CM | POA: Diagnosis present

## 2018-10-20 LAB — NOVEL CORONAVIRUS, NAA (HOSP ORDER, SEND-OUT TO REF LAB; TAT 18-24 HRS): SARS-CoV-2, NAA: NOT DETECTED

## 2018-10-20 NOTE — ED Triage Notes (Signed)
Per EMS report, patient c/o mid-sternal chest pressure since 2200 tonight. Patient is scheduled for a heart catherization next Wednesday. Patient was given 324mg  ASA and one spray of Nitro en route without any relief. Patient has a pacemaker.

## 2018-10-21 ENCOUNTER — Other Ambulatory Visit: Payer: Self-pay

## 2018-10-21 ENCOUNTER — Emergency Department: Payer: Medicare HMO

## 2018-10-21 ENCOUNTER — Inpatient Hospital Stay: Payer: Medicare HMO

## 2018-10-21 ENCOUNTER — Encounter: Payer: Self-pay | Admitting: Internal Medicine

## 2018-10-21 DIAGNOSIS — E538 Deficiency of other specified B group vitamins: Secondary | ICD-10-CM | POA: Diagnosis present

## 2018-10-21 DIAGNOSIS — I480 Paroxysmal atrial fibrillation: Secondary | ICD-10-CM | POA: Diagnosis present

## 2018-10-21 DIAGNOSIS — N179 Acute kidney failure, unspecified: Secondary | ICD-10-CM | POA: Diagnosis present

## 2018-10-21 DIAGNOSIS — R0789 Other chest pain: Secondary | ICD-10-CM | POA: Diagnosis not present

## 2018-10-21 DIAGNOSIS — I482 Chronic atrial fibrillation, unspecified: Secondary | ICD-10-CM | POA: Diagnosis present

## 2018-10-21 DIAGNOSIS — I214 Non-ST elevation (NSTEMI) myocardial infarction: Secondary | ICD-10-CM | POA: Diagnosis present

## 2018-10-21 DIAGNOSIS — I313 Pericardial effusion (noninflammatory): Secondary | ICD-10-CM | POA: Diagnosis present

## 2018-10-21 DIAGNOSIS — Z8249 Family history of ischemic heart disease and other diseases of the circulatory system: Secondary | ICD-10-CM | POA: Diagnosis not present

## 2018-10-21 DIAGNOSIS — I495 Sick sinus syndrome: Secondary | ICD-10-CM | POA: Diagnosis present

## 2018-10-21 DIAGNOSIS — R079 Chest pain, unspecified: Secondary | ICD-10-CM | POA: Diagnosis present

## 2018-10-21 DIAGNOSIS — I25110 Atherosclerotic heart disease of native coronary artery with unstable angina pectoris: Secondary | ICD-10-CM | POA: Diagnosis present

## 2018-10-21 DIAGNOSIS — K760 Fatty (change of) liver, not elsewhere classified: Secondary | ICD-10-CM | POA: Diagnosis present

## 2018-10-21 DIAGNOSIS — M109 Gout, unspecified: Secondary | ICD-10-CM | POA: Diagnosis present

## 2018-10-21 DIAGNOSIS — D519 Vitamin B12 deficiency anemia, unspecified: Secondary | ICD-10-CM | POA: Diagnosis not present

## 2018-10-21 DIAGNOSIS — R0781 Pleurodynia: Secondary | ICD-10-CM | POA: Diagnosis not present

## 2018-10-21 DIAGNOSIS — D631 Anemia in chronic kidney disease: Secondary | ICD-10-CM | POA: Diagnosis present

## 2018-10-21 DIAGNOSIS — E785 Hyperlipidemia, unspecified: Secondary | ICD-10-CM | POA: Diagnosis present

## 2018-10-21 DIAGNOSIS — D696 Thrombocytopenia, unspecified: Secondary | ICD-10-CM | POA: Diagnosis present

## 2018-10-21 DIAGNOSIS — F17200 Nicotine dependence, unspecified, uncomplicated: Secondary | ICD-10-CM | POA: Diagnosis present

## 2018-10-21 DIAGNOSIS — Z7901 Long term (current) use of anticoagulants: Secondary | ICD-10-CM | POA: Diagnosis not present

## 2018-10-21 DIAGNOSIS — D539 Nutritional anemia, unspecified: Secondary | ICD-10-CM | POA: Diagnosis present

## 2018-10-21 DIAGNOSIS — N183 Chronic kidney disease, stage 3 (moderate): Secondary | ICD-10-CM | POA: Diagnosis present

## 2018-10-21 DIAGNOSIS — Z950 Presence of cardiac pacemaker: Secondary | ICD-10-CM | POA: Diagnosis not present

## 2018-10-21 DIAGNOSIS — Z9884 Bariatric surgery status: Secondary | ICD-10-CM | POA: Diagnosis not present

## 2018-10-21 DIAGNOSIS — K219 Gastro-esophageal reflux disease without esophagitis: Secondary | ICD-10-CM | POA: Diagnosis present

## 2018-10-21 DIAGNOSIS — I129 Hypertensive chronic kidney disease with stage 1 through stage 4 chronic kidney disease, or unspecified chronic kidney disease: Secondary | ICD-10-CM | POA: Diagnosis present

## 2018-10-21 DIAGNOSIS — I200 Unstable angina: Secondary | ICD-10-CM | POA: Diagnosis not present

## 2018-10-21 DIAGNOSIS — D510 Vitamin B12 deficiency anemia due to intrinsic factor deficiency: Secondary | ICD-10-CM | POA: Diagnosis not present

## 2018-10-21 LAB — TSH: TSH: 4.06 u[IU]/mL (ref 0.350–4.500)

## 2018-10-21 LAB — TROPONIN I
Troponin I: <0.03 ng/mL (ref <0.03)
Troponin I: <0.03 ng/mL (ref <0.03)
Troponin I: <0.03 ng/mL (ref <0.03)
Troponin I: <0.03 ng/mL (ref <0.03)
Troponin I: <0.03 ng/mL (ref <0.03)

## 2018-10-21 LAB — BASIC METABOLIC PANEL
Anion gap: 10 (ref 5–15)
Anion gap: 11 (ref 5–15)
BUN: 41 mg/dL — ABNORMAL HIGH (ref 8–23)
BUN: 49 mg/dL — ABNORMAL HIGH (ref 8–23)
CO2: 22 mmol/L (ref 22–32)
CO2: 24 mmol/L (ref 22–32)
Calcium: 8.3 mg/dL — ABNORMAL LOW (ref 8.9–10.3)
Calcium: 9 mg/dL (ref 8.9–10.3)
Chloride: 103 mmol/L (ref 98–111)
Chloride: 105 mmol/L (ref 98–111)
Creatinine, Ser: 1.79 mg/dL — ABNORMAL HIGH (ref 0.61–1.24)
Creatinine, Ser: 2.52 mg/dL — ABNORMAL HIGH (ref 0.61–1.24)
GFR calc Af Amer: 29 mL/min — ABNORMAL LOW (ref >60)
GFR calc Af Amer: 43 mL/min — ABNORMAL LOW (ref >60)
GFR calc non Af Amer: 25 mL/min — ABNORMAL LOW (ref >60)
GFR calc non Af Amer: 37 mL/min — ABNORMAL LOW (ref >60)
Glucose, Bld: 108 mg/dL — ABNORMAL HIGH (ref 70–99)
Glucose, Bld: 126 mg/dL — ABNORMAL HIGH (ref 70–99)
Potassium: 4.3 mmol/L (ref 3.5–5.1)
Potassium: 4.3 mmol/L (ref 3.5–5.1)
Sodium: 135 mmol/L (ref 135–145)
Sodium: 140 mmol/L (ref 135–145)

## 2018-10-21 LAB — CBC
HCT: 38.6 % — ABNORMAL LOW (ref 39.0–52.0)
Hemoglobin: 12.9 g/dL — ABNORMAL LOW (ref 13.0–17.0)
MCH: 38.1 pg — ABNORMAL HIGH (ref 26.0–34.0)
MCHC: 33.4 g/dL (ref 30.0–36.0)
MCV: 113.9 fL — ABNORMAL HIGH (ref 80.0–100.0)
Platelets: 144 10*3/uL — ABNORMAL LOW (ref 150–400)
RBC: 3.39 MIL/uL — ABNORMAL LOW (ref 4.22–5.81)
RDW: 13.9 % (ref 11.5–15.5)
WBC: 11.2 10*3/uL — ABNORMAL HIGH (ref 4.0–10.5)
nRBC: 0 % (ref 0.0–0.2)

## 2018-10-21 LAB — APTT
aPTT: 26 seconds (ref 24–36)
aPTT: 75 seconds — ABNORMAL HIGH (ref 24–36)
aPTT: 104 seconds — ABNORMAL HIGH (ref 24–36)

## 2018-10-21 LAB — HEPARIN LEVEL (UNFRACTIONATED)
Heparin Unfractionated: 0.58 IU/mL (ref 0.30–0.70)
Heparin Unfractionated: 0.89 IU/mL — ABNORMAL HIGH (ref 0.30–0.70)

## 2018-10-21 LAB — HEMOGLOBIN A1C
Hgb A1c MFr Bld: 5.3 % (ref 4.8–5.6)
Mean Plasma Glucose: 105.41 mg/dL

## 2018-10-21 LAB — PROTIME-INR
INR: 1 (ref 0.8–1.2)
Prothrombin Time: 13 seconds (ref 11.4–15.2)

## 2018-10-21 MED ORDER — SAW PALMETTO 80 MG PO CAPS: 1.0000 | ORAL_CAPSULE | Freq: Every day | ORAL | Status: DC

## 2018-10-21 MED ORDER — ACETAMINOPHEN 650 MG RE SUPP: 650.0000 mg | Freq: Four times a day (QID) | RECTAL | Status: DC | PRN

## 2018-10-21 MED ORDER — MAGNESIUM OXIDE 400 (241.3 MG) MG PO TABS
400.0000 mg | ORAL_TABLET | Freq: Every day | ORAL | Status: DC
  Administered 2018-10-21 – 2018-10-25 (×4): 400 mg via ORAL
  Filled 2018-10-21 (×4): qty 1

## 2018-10-21 MED ORDER — ONDANSETRON HCL 4 MG PO TABS: 4.0000 mg | ORAL_TABLET | Freq: Four times a day (QID) | ORAL | Status: DC | PRN

## 2018-10-21 MED ORDER — SODIUM CHLORIDE 0.9 % IV SOLN
INTRAVENOUS | Status: DC
  Administered 2018-10-21 – 2018-10-25 (×11): via INTRAVENOUS

## 2018-10-21 MED ORDER — AMLODIPINE BESYLATE 5 MG PO TABS
5.0000 mg | ORAL_TABLET | Freq: Every evening | ORAL | Status: DC
  Administered 2018-10-21 – 2018-10-23 (×3): 5 mg via ORAL
  Filled 2018-10-21 (×3): qty 1

## 2018-10-21 MED ORDER — FENTANYL CITRATE (PF) 100 MCG/2ML IJ SOLN
50.0000 ug | Freq: Once | INTRAMUSCULAR | Status: AC
  Administered 2018-10-21: 50 ug via INTRAVENOUS
  Filled 2018-10-21: qty 2

## 2018-10-21 MED ORDER — ONDANSETRON HCL 4 MG/2ML IJ SOLN: 4.0000 mg | Freq: Four times a day (QID) | INTRAMUSCULAR | Status: DC | PRN

## 2018-10-21 MED ORDER — PANTOPRAZOLE SODIUM 40 MG PO TBEC
40.0000 mg | DELAYED_RELEASE_TABLET | Freq: Every day | ORAL | Status: DC
  Administered 2018-10-21 – 2018-10-22 (×2): 40 mg via ORAL
  Filled 2018-10-21 (×2): qty 1

## 2018-10-21 MED ORDER — DOCUSATE SODIUM 100 MG PO CAPS
100.0000 mg | ORAL_CAPSULE | Freq: Two times a day (BID) | ORAL | Status: DC
  Administered 2018-10-21 – 2018-10-25 (×5): 100 mg via ORAL
  Filled 2018-10-21 (×5): qty 1

## 2018-10-21 MED ORDER — LIDOCAINE 5 % EX PTCH
1.0000 | MEDICATED_PATCH | CUTANEOUS | Status: DC
  Administered 2018-10-21: 1 via TRANSDERMAL
  Filled 2018-10-21 (×5): qty 1

## 2018-10-21 MED ORDER — SODIUM CHLORIDE 0.9% FLUSH: 3.0000 mL | INTRAVENOUS | Status: DC | PRN

## 2018-10-21 MED ORDER — SODIUM CHLORIDE 0.9 % IV SOLN: 250.0000 mL | INTRAVENOUS | Status: DC | PRN

## 2018-10-21 MED ORDER — HEPARIN (PORCINE) 25000 UT/250ML-% IV SOLN
1200.0000 [IU]/h | INTRAVENOUS | Status: DC
  Administered 2018-10-21 (×2): 1300 [IU]/h via INTRAVENOUS
  Administered 2018-10-22: 1200 [IU]/h via INTRAVENOUS
  Filled 2018-10-21 (×3): qty 250

## 2018-10-21 MED ORDER — ALLOPURINOL 300 MG PO TABS
300.0000 mg | ORAL_TABLET | Freq: Every day | ORAL | Status: DC
  Administered 2018-10-21: 300 mg via ORAL
  Filled 2018-10-21: qty 1

## 2018-10-21 MED ORDER — SODIUM CHLORIDE 0.9% FLUSH
3.0000 mL | Freq: Two times a day (BID) | INTRAVENOUS | Status: DC
  Administered 2018-10-22 – 2018-10-23 (×2): 3 mL via INTRAVENOUS

## 2018-10-21 MED ORDER — SODIUM CHLORIDE 0.9% FLUSH: 3.0000 mL | Freq: Two times a day (BID) | INTRAVENOUS | Status: DC

## 2018-10-21 MED ORDER — AMIODARONE HCL 200 MG PO TABS
100.0000 mg | ORAL_TABLET | Freq: Every day | ORAL | Status: DC
  Administered 2018-10-21 – 2018-10-25 (×5): 100 mg via ORAL
  Filled 2018-10-21 (×5): qty 1

## 2018-10-21 MED ORDER — SODIUM CHLORIDE 0.9 % IV BOLUS
500.0000 mL | Freq: Once | INTRAVENOUS | Status: AC
  Administered 2018-10-21: 500 mL via INTRAVENOUS

## 2018-10-21 MED ORDER — CYCLOBENZAPRINE HCL 10 MG PO TABS: 5.0000 mg | ORAL_TABLET | Freq: Three times a day (TID) | ORAL | Status: DC | PRN

## 2018-10-21 MED ORDER — ACETAMINOPHEN 325 MG PO TABS
650.0000 mg | ORAL_TABLET | Freq: Four times a day (QID) | ORAL | Status: DC | PRN
  Administered 2018-10-22: 03:00:00 650 mg via ORAL
  Filled 2018-10-21: qty 2

## 2018-10-21 MED ORDER — ASPIRIN 81 MG PO CHEW: 81.0000 mg | CHEWABLE_TABLET | ORAL | Status: AC

## 2018-10-21 MED ORDER — NITROGLYCERIN 2 % TD OINT
0.5000 [in_us] | TOPICAL_OINTMENT | Freq: Four times a day (QID) | TRANSDERMAL | Status: DC
  Administered 2018-10-21 (×3): 0.5 [in_us] via TOPICAL
  Filled 2018-10-21 (×4): qty 1

## 2018-10-21 MED ORDER — SIMVASTATIN 20 MG PO TABS
20.0000 mg | ORAL_TABLET | Freq: Every day | ORAL | Status: DC
  Administered 2018-10-21 – 2018-10-24 (×4): 20 mg via ORAL
  Filled 2018-10-21 (×4): qty 1

## 2018-10-21 MED ORDER — CYCLOBENZAPRINE HCL 10 MG PO TABS: 5.0000 mg | ORAL_TABLET | Freq: Two times a day (BID) | ORAL | Status: DC | PRN

## 2018-10-21 MED ORDER — HEPARIN BOLUS VIA INFUSION
4000.0000 [IU] | Freq: Once | INTRAVENOUS | Status: AC
  Administered 2018-10-21: 4000 [IU] via INTRAVENOUS
  Filled 2018-10-21: qty 4000

## 2018-10-21 MED ORDER — MORPHINE SULFATE (PF) 2 MG/ML IV SOLN
2.0000 mg | INTRAVENOUS | Status: DC | PRN
  Administered 2018-10-21: 2 mg via INTRAVENOUS
  Filled 2018-10-21: qty 1

## 2018-10-21 MED ORDER — KETOROLAC TROMETHAMINE 30 MG/ML IJ SOLN
30.0000 mg | Freq: Once | INTRAMUSCULAR | Status: AC
  Administered 2018-10-21: 30 mg via INTRAVENOUS
  Filled 2018-10-21: qty 1

## 2018-10-21 MED ORDER — MORPHINE SULFATE (PF) 2 MG/ML IV SOLN
2.0000 mg | INTRAVENOUS | Status: AC
  Administered 2018-10-21: 04:00:00 2 mg via INTRAVENOUS
  Filled 2018-10-21: qty 1

## 2018-10-21 MED ORDER — MORPHINE SULFATE (PF) 4 MG/ML IV SOLN
4.0000 mg | Freq: Once | INTRAVENOUS | Status: AC
  Administered 2018-10-21: 4 mg via INTRAVENOUS
  Filled 2018-10-21: qty 1

## 2018-10-21 MED ORDER — NITROGLYCERIN 0.4 MG SL SUBL: 0.4000 mg | SUBLINGUAL_TABLET | SUBLINGUAL | Status: DC | PRN

## 2018-10-21 NOTE — H&P (Signed)
Mike Townsend is an 73 y.o. male.   Chief Complaint: Chest pain HPI: The patient with past medical history of hypertension, hyperlipidemia and atrial fibrillation presents to the emergency department complaining of chest pain.  The pain awoke the patient from sleep.  That he describes the pain as "somebody stabbing him in the chest".  The pain radiates throughout his chest and is associated with some shortness of breath with exertion.  He denies nausea, vomiting or diaphoresis.  The patient reports that he is usually very active and likes cardiovascular exercise.  However over the last week or more he has felt very fatigued.  The patient was scheduled for a cardiac catheterization later this week but could not bear the pain tonight which prompted him to seek evaluation.  He received nitroglycerin and aspirin in route to the emergency department.  A heparin drip was started and the hospitalist service was called for admission.  Past Medical History:  Diagnosis Date  . Gout   . Hyperlipidemia   . Hypertension   . Paroxysmal atrial fibrillation [facility])     Past Surgical History:  Procedure Laterality Date  . LAPAROSCOPIC GASTRIC BYPASS    . LUMBAR LAMINECTOMY    . PACEMAKER INSERTION N/A 03/22/2016   Procedure: INSERTION PACEMAKER;  Surgeon: Isaias Cowman, MD;  Location: [facility] ORS;  Service: Cardiovascular;  Laterality: N/A;  . TONSILLECTOMY      Family History  Problem Relation Age of Onset  . CAD Father    Social History:  reports that he has been smoking pipe. He has never used smokeless tobacco. He reports current alcohol use. He reports that he does not use drugs.  Allergies: No Known Allergies  Medications Prior to Admission  Medication Sig Dispense Refill  . allopurinol (ZYLOPRIM) 300 MG tablet Take 300 mg by mouth daily.    Marland Kitchen amiodarone (PACERONE) 200 MG tablet Take 100 mg by mouth daily.     Marland Kitchen amLODipine (NORVASC) 5 MG tablet Take 5 mg by mouth every evening.     Marland Kitchen ELIQUIS  5 MG TABS tablet Take 5 mg by mouth 2 (two) times a day.    . magnesium oxide (MAG-OX) 400 MG tablet Take 400 mg by mouth daily.    . Saw Palmetto, Serenoa repens, (SAW PALMETTO PO) Take 1 capsule by mouth daily.    . simvastatin (ZOCOR) 20 MG tablet Take 20 mg by mouth at bedtime.      Results for orders placed or performed during the hospital encounter of 10/20/18 (from the past 48 hour(s))  Basic metabolic panel     Status: Abnormal   Collection Time: 10/21/18 12:08 AM  Result Value Ref Range   Sodium 140 135 - 145 mmol/L   Potassium 4.3 3.5 - 5.1 mmol/L   Chloride 105 98 - 111 mmol/L   CO2 24 22 - 32 mmol/L   Glucose, Bld 108 (H) 70 - 99 mg/dL   BUN 49 (H) 8 - 23 mg/dL   Creatinine, Ser 2.52 (H) 0.61 - 1.24 mg/dL   Calcium 9.0 8.9 - 10.3 mg/dL   GFR calc non Af Amer 25 (L) >60 mL/min   GFR calc Af Amer 29 (L) >60 mL/min   Anion gap 11 5 - 15    Comment: Performed at [facility], [address] [place], [place] [address]  CBC     Status: Abnormal   Collection Time: 10/21/18 12:08 AM  Result Value Ref Range   WBC 11.2 (H) 4.0 -  10.5 K/uL   RBC 3.39 (L) 4.22 - 5.81 MIL/uL   Hemoglobin 12.9 (L) 13.0 - 17.0 g/dL   HCT 40.938.6 (L) 81.139.0 - 91.452.0 %   MCV 113.9 (H) 80.0 - 100.0 fL   MCH 38.1 (H) 26.0 - 34.0 pg   MCHC 33.4 30.0 - 36.0 g/dL   RDW 78.213.9 95.611.5 - 21.315.5 %   Platelets 144 (L) 150 - 400 K/uL   nRBC 0.0 0.0 - 0.2 %    Comment: Performed at [facility], [address] [place], [place] [address]  Troponin I - ONCE - STAT     Status: None   Collection Time: 10/21/18 12:08 AM  Result Value Ref Range   Troponin I <0.03 <0.03 ng/mL    Comment: Performed at [facility], [address] [place], [place] [address]  APTT     Status: None   Collection Time: 10/21/18 12:08 AM  Result Value Ref Range   aPTT 26 24 - 36 seconds    Comment: Performed at [facility], [address] [place], [place] [address]  Protime-INR     Status: None    Collection Time: 10/21/18 12:08 AM  Result Value Ref Range   Prothrombin Time 13.0 11.4 - 15.2 seconds   INR 1.0 0.8 - 1.2    Comment: (NOTE) INR goal varies based on device and disease states. Performed at [facility], [address] [place], [place] [address]   Heparin level (unfractionated)     Status: None   Collection Time: 10/21/18 12:08 AM  Result Value Ref Range   Heparin Unfractionated 0.58 0.30 - 0.70 IU/mL    Comment: (NOTE) If heparin results are below expected values, and patient dosage has  been confirmed, suggest follow up testing of antithrombin III levels. Performed at [facility], [address] [place], [place] [address]    Dg Chest 2 View  Result Date: 10/21/2018 CLINICAL DATA:  Chest pressure EXAM: CHEST - 2 VIEW COMPARISON:  10/15/2018 FINDINGS: A dual chamber left-sided pacemaker is noted. The heart size is enlarged. There is no pneumothorax. No large pleural effusion. The lungs are somewhat hyperexpanded. There is slightly prominent interstitial lung markings bilaterally which is stable from prior study. Aortic calcifications are noted. IMPRESSION: No active cardiopulmonary disease. Electronically Signed   By: Katherine Mantlehristopher  Green M.D.   On: 10/21/2018 00:45    Review of Systems  Constitutional: Positive for malaise/fatigue. Negative for chills and fever.  HENT: Negative for sore throat and tinnitus.   Eyes: Negative for blurred vision and redness.  Respiratory: Negative for cough and shortness of breath.   Cardiovascular: Positive for chest pain. Negative for palpitations, orthopnea and PND.  Gastrointestinal: Negative for abdominal pain, diarrhea, nausea and vomiting.  Genitourinary: Negative for dysuria, frequency and urgency.  Musculoskeletal: Negative for joint pain and myalgias.  Skin: Negative for rash.       No lesions  Neurological: Negative for speech change, focal weakness and weakness.  Endo/Heme/Allergies: Does not  bruise/bleed easily.       No temperature intolerance  Psychiatric/Behavioral: Negative for depression and suicidal ideas.    Blood pressure (!) 116/95, pulse 70, temperature 99.4 F (37.4 C), temperature source Oral, resp. rate 17, height 6\' 2"  (1.88 m), weight 100.6 kg, SpO2 100 %. Physical Exam  Vitals reviewed. Constitutional: He is oriented to person, place, and time. He appears well-developed and well-nourished. No distress.  HENT:  Head: Normocephalic and atraumatic.  Mouth/Throat: Oropharynx is  clear and moist.  Eyes: Pupils are equal, round, and reactive to light. Conjunctivae and EOM are normal. No scleral icterus.  Neck: Normal range of motion. Neck supple. No JVD present. No tracheal deviation present. No thyromegaly present.  Cardiovascular: Normal rate, regular rhythm and normal heart sounds. Exam reveals no gallop and no friction rub.  No murmur heard. Respiratory: Effort normal and breath sounds normal. No respiratory distress. He has no wheezes.  GI: Soft. Bowel sounds are normal. He exhibits no distension. There is no abdominal tenderness.  Genitourinary:    Genitourinary Comments: Deferred   Musculoskeletal: Normal range of motion.        General: No edema.  Lymphadenopathy:    He has no cervical adenopathy.  Neurological: He is alert and oriented to person, place, and time. No cranial nerve deficit.  Skin: Skin is warm and dry. No rash noted. No erythema.  Psychiatric: He has a normal mood and affect. His behavior is normal. Judgment and thought content normal.     Assessment/Plan This is a 73 year old male admitted for NSTEMI. 1.  NSTEMI: Paced rhythm therefore EKG difficult to determine ischemic changes.  Patient has ongoing chest pain.  Troponin is negative so far.  Continue to monitor telemetry.  Morphine as needed for severe pain.  The patient is already received aspirin.  Continue heparin drip.  Consult cardiology. 2.  Hypertension: Controlled; continue  amlodipine 3.  Atrial fibrillation: Rate controlled; continue amiodarone.  Resume Eliquis following PCI 4.  Gout: Controlled; continue allopurinol 5.  DVT prophylaxis: Therapeutic anticoagulation 6.  GI prophylaxis: None The patient is a full code.  Time spent on admission orders and patient care approximately 45 minutes  Arnaldo Nataliamond,  Rossi Burdo S, MD 10/21/2018, 5:02 AM

## 2018-10-21 NOTE — ED Provider Notes (Signed)
[facility] Emergency Department Provider Note ____________________________________________   First MD Initiated Contact with Patient 10/20/18 2349     (approximate)  I have reviewed the triage vital signs and the nursing notes.   HISTORY  Chief Complaint Chest Pain    HPI Mike Townsend is a 73 y.o. male with PMH as noted below who presents with chest pain, acute onset around 10 PM, described as pressure-like, and somewhat radiating to his shoulders and back.  It is worse when he takes a deep breath.  It is not associated with shortness of breath, nausea, or lightheadedness.  He was not exerting himself at the time.  The patient reports that he has had some exertional fatigue over the last few months and is actually scheduled for a cardiac catheterization next week.  He has never had chest pain like this before.  Past Medical History:  Diagnosis Date  . Gout   . Hyperlipidemia   . Hypertension   . Paroxysmal atrial fibrillation [facility](HCC)     Patient Active Problem List   Diagnosis Date Noted  . NSTEMI (non-ST elevated myocardial infarction) (HCC) 10/21/2018  . Sick sinus syndrome (HCC) 03/22/2016  . Bradycardia 03/22/2016    Past Surgical History:  Procedure Laterality Date  . LAPAROSCOPIC GASTRIC BYPASS    . LUMBAR LAMINECTOMY    . PACEMAKER INSERTION N/A 03/22/2016   Procedure: INSERTION PACEMAKER;  Surgeon: Marcina MillardAlexander Paraschos, MD;  Location: [facility] ORS;  Service: Cardiovascular;  Laterality: N/A;  . TONSILLECTOMY      Prior to Admission medications   Medication Sig Start Date End Date Taking? Authorizing Provider  allopurinol (ZYLOPRIM) 300 MG tablet Take 300 mg by mouth daily.   Yes [provider]  amiodarone (PACERONE) 200 MG tablet Take 100 mg by mouth daily.    Yes [provider]  amLODipine (NORVASC) 5 MG tablet Take 5 mg by mouth every evening.    Yes [provider]  ELIQUIS 5 MG TABS tablet Take 5 mg by mouth  2 (two) times a day. 10/15/18  Yes [provider]  magnesium oxide (MAG-OX) 400 MG tablet Take 400 mg by mouth daily.   Yes [provider]  Saw Palmetto, Serenoa repens, (SAW PALMETTO PO) Take 1 capsule by mouth daily.   Yes [provider]  simvastatin (ZOCOR) 20 MG tablet Take 20 mg by mouth at bedtime.   Yes [provider]    Allergies Patient has no known allergies.  No family history on file.  Social History Social History   Tobacco Use  . Smoking status: Light Tobacco Smoker    Types: Pipe  . Smokeless tobacco: Never Used  Substance Use Topics  . Alcohol use: Yes    Comment: wine occ  . Drug use: No    Review of Systems  Constitutional: No fever. Eyes: No redness. ENT: Positive for chest pain radiating to the neck. Cardiovascular: Positive for chest pain. Respiratory: Denies shortness of breath. Gastrointestinal: No nausea or vomiting. Genitourinary: Negative for flank pain.  Musculoskeletal: Negative for back pain. Skin: Negative for rash. Neurological: Negative for headache.   ____________________________________________   PHYSICAL EXAM:  VITAL SIGNS: ED Triage Vitals  Enc Vitals Group     BP 10/20/18 2334 (!) 99/58     Pulse Rate 10/20/18 2334 61     Resp 10/20/18 2334 16     Temp 10/20/18 2334 98.3 F (36.8 C)     Temp Source 10/20/18 2334 Oral  SpO2 10/20/18 2334 98 %     Weight 10/20/18 2341 220 lb (99.8 kg)     Height 10/20/18 2341 6\' 2"  (1.88 m)     Head Circumference --      Peak Flow --      Pain Score 10/20/18 2341 8     Pain Loc --      Pain Edu? --      Excl. in [place]? --     Constitutional: Alert and oriented.  Uncomfortable but relatively well appearing and in no acute distress. Eyes: Conjunctivae are normal.  Head: Atraumatic. Nose: No congestion/rhinnorhea. Mouth/Throat: Mucous membranes are moist.   Neck: Normal range of motion.  Cardiovascular: Normal rate, regular rhythm. Grossly normal  heart sounds.  Good peripheral circulation. Respiratory: Normal respiratory effort.  No retractions. Lungs CTAB. Gastrointestinal: No distention.  Musculoskeletal: No lower extremity edema.  Extremities warm and well perfused.  Neurologic:  Normal speech and language. No gross focal neurologic deficits are appreciated.  Skin:  Skin is warm and dry. No rash noted. Psychiatric: Mood and affect are normal. Speech and behavior are normal.  ____________________________________________   LABS (all labs ordered are listed, but only abnormal results are displayed)  Labs Reviewed  BASIC METABOLIC PANEL - Abnormal; Notable for the following components:      Result Value   Glucose, Bld 108 (*)    BUN 49 (*)    Creatinine, Ser 2.52 (*)    GFR calc non Af Amer 25 (*)    GFR calc Af Amer 29 (*)    All other components within normal limits  CBC - Abnormal; Notable for the following components:   WBC 11.2 (*)    RBC 3.39 (*)    Hemoglobin 12.9 (*)    HCT 38.6 (*)    MCV 113.9 (*)    MCH 38.1 (*)    Platelets 144 (*)    All other components within normal limits  TROPONIN I  APTT  PROTIME-INR  HEPARIN LEVEL (UNFRACTIONATED)  HEPARIN LEVEL (UNFRACTIONATED)  APTT   ____________________________________________  EKG  ED ECG REPORT I, Arta Silence, the attending physician, personally viewed and interpreted this ECG.  Date: 10/21/2018 EKG Time: 2328 Rate: 61 Rhythm: AV dual paced rhythm ST/T Wave abnormalities: normal Narrative Interpretation: no evidence of acute ischemia; no recent prior EKG available for comparison  ED ECG REPORT I, Arta Silence, the attending physician, personally viewed and interpreted this ECG.  Date: 10/21/2018 EKG Time: 0126 Rate: 68 Rhythm: AV dual paced rhythm QRS Axis: normal Intervals: Intermittent wide QRS beats ST/T Wave abnormalities: Deeper T wave inversions in anterior leads when compared to EKG of 2328 yesterday Narrative  Interpretation: Worsening anterior T wave inversions; nonspecific   ____________________________________________  RADIOLOGY  CXR: No acute abnormality  ____________________________________________   PROCEDURES  Procedure(s) performed: No  Procedures  Critical Care performed: Yes  CRITICAL CARE Performed by: Arta Silence   Total critical care time: 45 minutes  Critical care time was exclusive of separately billable procedures and treating other patients.  Critical care was necessary to treat or prevent imminent or life-threatening deterioration.  Critical care was time spent personally by me on the following activities: development of treatment plan with patient and/or surrogate as well as nursing, discussions with consultants, evaluation of patient's response to treatment, examination of patient, obtaining history from patient or surrogate, ordering and performing treatments and interventions, ordering and review of laboratory studies, ordering and review of radiographic studies, pulse oximetry and re-evaluation  of patient's condition. ____________________________________________   INITIAL IMPRESSION / ASSESSMENT AND PLAN / ED COURSE  Pertinent labs & imaging results that were available during my care of the patient were reviewed by me and considered in my medical decision making (see chart for details).  73 year old male with a history of sick sinus syndrome status post pacemaker presents with acute onset of nonexertional chest pain somewhat radiating to his upper back and neck and not associated with shortness of breath or lightheadedness.  He has never had this kind of pain before.  I reviewed the past medical records in Epic.  The patient follows with Dr. Darrold JunkerParaschos and is actually scheduled for a cardiac catheterization this week due to persistent exertional fatigue over the last few months.  On exam the patient is overall relatively well-appearing.  His blood  pressure is borderline low although this may be because of nitroglycerin that he was given by EMS.  He also received aspirin prior to arrival.  The remainder of the exam is unremarkable.  EKG is a paced rhythm and shows no obvious ischemic changes although I do not have a recent prior EKG available for comparison.  Overall presentation is concerning for ACS.  Given the radiation to the back and shoulders as well as the relatively acute onset, there is a small possibility of aortic pathology or less likely acute PE.  The patient's overall well appearance and reassuring vital signs go against this.  He has no signs or symptoms of DVT.  He has no prior history of aortic aneurysm or other significant vascular pathology.  We will obtain lab work-up including cardiac enzymes, obtain chest x-ray, and consider further imaging based on the patient's symptoms and the results of the initial work-up.  ----------------------------------------- 3:45 AM on 10/21/2018 -----------------------------------------  Initial troponin is negative.  The patient continued to report relatively severe chest pain.  I noticed some PVC or ventricular type complexes on the monitor and ordered a repeat EKG.  The patient now has frequent wide-complex beats and possibly deeper T wave inversions anteriorly during the normal beats.  I am concerned for unstable angina.  I consulted Dr. Juliann Paresallwood from cardiology.  He recommended starting the patient on heparin even though he is already on Eliquis.  We will plan to admit the patient.  The patient's creatinine is also significant elevated from his baseline.  I initially considered obtaining a CT to rule out vascular pathology or PE.  Given the patient's renal function I am unable to obtain a CT.  Given his relatively stable vital signs and overall well appearance I have a lower suspicion for vascular pathology than ACS.  The patient is already anticoagulated, and we are now planning on starting  heparin.  Therefore, I do not think that a CT would be appropriate given the high chance of causing worsening renal injury.  I discussed the results of the work-up with the patient.  I signed the patient out for admission to the hospitalist Dr. Sheryle Hailiamond.  The patient already has COVID test pending since he had one done as an outpatient in preparation for the catheterization this week.  ________________________________  Mike Townsend was evaluated in Emergency Department on 10/21/2018 for the symptoms described in the history of present illness. He was evaluated in the context of the global COVID-19 pandemic, which necessitated consideration that the patient might be at risk for infection with the SARS-CoV-2 virus that causes COVID-19. Institutional protocols and algorithms that pertain to the evaluation of patients at  risk for COVID-19 are in a state of rapid change based on information released by regulatory bodies including the CDC and federal and state organizations. These policies and algorithms were followed during the patient's care in the ED.   ____________________________________________   FINAL CLINICAL IMPRESSION(S) / ED DIAGNOSES  Final diagnoses:  Unstable angina (HCC)      NEW MEDICATIONS STARTED DURING THIS VISIT:  New Prescriptions   No medications on file     Note:  This document was prepared using Dragon voice recognition software and may include unintentional dictation errors.    Dionne BucySiadecki, Reymundo Winship, MD 10/21/18 [id number]

## 2018-10-21 NOTE — Progress Notes (Addendum)
[facility] [place] [facility]   PATIENT NAME: Mike MutterKent Lute    MR#:  [id number]  DATE OF BIRTH:  1945-06-12  SUBJECTIVE:  CHIEF COMPLAINT: Patient is reporting anterior chest wall pain radiating to the back.  Reporting hurts when he takes deep breaths   REVIEW OF SYSTEMS:  CONSTITUTIONAL: No fever, fatigue or weakness.  EYES: No blurred or double vision.  EARS, NOSE, AND THROAT: No tinnitus or ear pain.  RESPIRATORY: No cough, shortness of breath, wheezing or hemoptysis.  CARDIOVASCULAR: Anterior chest wall  pain, pain gets worse with deep inspirations, denies orthopnea, edema.  GASTROINTESTINAL: No nausea, vomiting, diarrhea or abdominal pain.  GENITOURINARY: No dysuria, hematuria.  ENDOCRINE: No polyuria, nocturia,  HEMATOLOGY: No anemia, easy bruising or bleeding SKIN: No rash or lesion. MUSCULOSKELETAL: No joint pain or arthritis.   NEUROLOGIC: No tingling, numbness, weakness.  PSYCHIATRY: No anxiety or depression.   DRUG ALLERGIES:  No Known Allergies  VITALS:  Blood pressure (!) 146/81, pulse 73, temperature 98.3 F (36.8 C), temperature source Oral, resp. rate 19, height 6\' 2"  (1.88 m), weight 100.6 kg, SpO2 100 %.  PHYSICAL EXAMINATION:  GENERAL:  73 y.o.-year-old patient lying in the bed with no acute distress.  EYES: Pupils equal, round, reactive to light and accommodation. No scleral icterus. Extraocular muscles intact.  HEENT: Head atraumatic, normocephalic. Oropharynx and nasopharynx clear.  NECK:  Supple, no jugular venous distention. No thyroid enlargement, no tenderness.  LUNGS: Normal breath sounds bilaterally, no wheezing, rales,rhonchi or crepitation. No use of accessory muscles of respiration.  CARDIOVASCULAR: S1, S2 normal. No murmurs, rubs, or gallops.  Anterior chest wall tenderness on palpation is minimal ABDOMEN: Soft, nontender, nondistended. Bowel sounds present.  EXTREMITIES: No pedal edema, cyanosis, or clubbing.   NEUROLOGIC: Cranial nerves II through XII are intact. Muscle strength at his baseline in all extremities. Sensation intact. Gait not checked.  PSYCHIATRIC: The patient is alert and oriented x 3.  SKIN: No obvious rash, lesion, or ulcer.    LABORATORY PANEL:   CBC Recent Labs  Lab 10/21/18 0008  WBC 11.2*  HGB 12.9*  HCT 38.6*  PLT 144*   ------------------------------------------------------------------------------------------------------------------  Chemistries  Recent Labs  Lab 10/21/18 0008  NA 140  K 4.3  CL 105  CO2 24  GLUCOSE 108*  BUN 49*  CREATININE 2.52*  CALCIUM 9.0   ------------------------------------------------------------------------------------------------------------------  Cardiac Enzymes Recent Labs  Lab 10/21/18 1049  TROPONINI <0.03   ------------------------------------------------------------------------------------------------------------------  RADIOLOGY:  Dg Chest 2 View  Result Date: 10/21/2018 CLINICAL DATA:  Chest pressure EXAM: CHEST - 2 VIEW COMPARISON:  10/15/2018 FINDINGS: A dual chamber left-sided pacemaker is noted. The heart size is enlarged. There is no pneumothorax. No large pleural effusion. The lungs are somewhat hyperexpanded. There is slightly prominent interstitial lung markings bilaterally which is stable from prior study. Aortic calcifications are noted. IMPRESSION: No active cardiopulmonary disease. Electronically Signed   By: Katherine Mantlehristopher  Green M.D.   On: 10/21/2018 00:45    EKG:   Orders placed or performed during the hospital encounter of 10/20/18  . ED EKG  . ED EKG    ASSESSMENT AND PLAN:   This is a 73 year old male admitted for chest pain #.  Angina versus pleuritic chest pain versus musculoskeletal pain versus GERD Monitor patient on telemetry and troponins are negative so far-less than 0.033.  Discussed with cardiology Dr. Juliann Parescallwood  not recommending any interventions today.  Patient is actually  scheduled for cardiac cath on Wednesday  Paced rhythm therefore EKG difficult to determine ischemic changes.  Patient has ongoing chest pain.  Troponin is negative so far.  Continue to monitor telemetry.  Morphine as needed for severe pain.  The patient is already received aspirin.  Continue heparin drip.  Consult cardiology. VQ scan to rule out pulmonary embolism cannot consider CT angiogram of the chest as renal function is worse Muscle relaxant as needed  #.  Acute kidney injury on chronic kidney disease- IV fluids, avoid nephrotoxins and discontinued allopurinol Renal ultrasound We will monitor intake and output. Foley catheter for close monitoring of the output Will consider renal consult if no improvement   # Hypertension: Controlled; continue amlodipine  #   Atrial fibrillation: Rate controlled; continue amiodarone.  Resume Eliquis following PCI.  Patient scheduled for cardiac cath on Wednesday even prior to this admission  #.  Gout: Controlled; holding allopurinol  #Tobacco abuse disorder counseled patient to quit using pipes.  He is agreeable.  Will think about nicotine patch  .  DVT prophylaxis: Therapeutic anticoagulation  .  GI prophylaxis: PPI     All the records are reviewed and case discussed with Care Management/Social Workerr. Management plans discussed with the patient, daughter Ria Comment over phone [phone] and they are in agreement.  CODE STATUS: fc   TOTAL TIME TAKING CARE OF THIS PATIENT: 39  minutes.   POSSIBLE D/C IN 1-2 DAYS, DEPENDING ON CLINICAL CONDITION.  Note: This dictation was prepared with Dragon dictation along with smaller phrase technology. Any transcriptional errors that result from this process are unintentional.   Nicholes Mango M.D on 10/21/2018 at 12:13 PM  Between 7am to 6pm - Pager - [phone] After 6pm go to [email or web address] - password [email or web address] [facility]  Office  [phone]  CC: Primary care physician;  Rusty Aus, MD

## 2018-10-21 NOTE — Consult Note (Signed)
Reason for Consult: Chest pain possible unstable angina Referring Physician: Dr. Joycelyn RuaMichael diamond hospitalist Dr. Bethann PunchesMark Miller primary Cardiologist Dr. Claris PongParaschos  Mike Townsend is an 73 y.o. male.  HPI: Patient is a 73 year old white male history of atrial fibrillation hypertension hyperlipidemia gout complains of persistent chest pain symptoms over the last several days patient has sick sinus syndrome and permanent pacemaker in place.  Patient is maintained on anticoagulation with Eliquis and amiodarone for rhythm he is described atypical midsternal chest pain worse with breathing and deep inspiration as well as certain movements with his upper body.  EKGs not helpful because of paced rhythm.  Patient denies any trauma no bleeding no nausea vomiting or diarrhea no previous chest pain episodes  Past Medical History:  Diagnosis Date  . Gout   . Hyperlipidemia   . Hypertension   . Paroxysmal atrial fibrillation [facility](HCC)     Past Surgical History:  Procedure Laterality Date  . LAPAROSCOPIC GASTRIC BYPASS    . LUMBAR LAMINECTOMY    . PACEMAKER INSERTION N/A 03/22/2016   Procedure: INSERTION PACEMAKER;  Surgeon: Marcina MillardAlexander Paraschos, MD;  Location: [facility] ORS;  Service: Cardiovascular;  Laterality: N/A;  . TONSILLECTOMY      Family History  Problem Relation Age of Onset  . CAD Father     Social History:  reports that he has been smoking pipe. He has never used smokeless tobacco. He reports current alcohol use. He reports that he does not use drugs.  Allergies: No Known Allergies  Medications: I have reviewed the patient's current medications.  Results for orders placed or performed during the hospital encounter of 10/20/18 (from the past 48 hour(s))  Basic metabolic panel     Status: Abnormal   Collection Time: 10/21/18 12:08 AM  Result Value Ref Range   Sodium 140 135 - 145 mmol/L   Potassium 4.3 3.5 - 5.1 mmol/L   Chloride 105 98 - 111 mmol/L   CO2 24 22 - 32 mmol/L   Glucose, Bld 108  (H) 70 - 99 mg/dL   BUN 49 (H) 8 - 23 mg/dL   Creatinine, Ser 1.612.52 (H) 0.61 - 1.24 mg/dL   Calcium 9.0 8.9 - 09.610.3 mg/dL   GFR calc non Af Amer 25 (L) >60 mL/min   GFR calc Af Amer 29 (L) >60 mL/min   Anion gap 11 5 - 15    Comment: Performed at [facility], [address] [place], [place] [address]  CBC     Status: Abnormal   Collection Time: 10/21/18 12:08 AM  Result Value Ref Range   WBC 11.2 (H) 4.0 - 10.5 K/uL   RBC 3.39 (L) 4.22 - 5.81 MIL/uL   Hemoglobin 12.9 (L) 13.0 - 17.0 g/dL   HCT 98.138.6 (L) 19.139.0 - 47.852.0 %   MCV 113.9 (H) 80.0 - 100.0 fL   MCH 38.1 (H) 26.0 - 34.0 pg   MCHC 33.4 30.0 - 36.0 g/dL   RDW 29.513.9 62.111.5 - 30.815.5 %   Platelets 144 (L) 150 - 400 K/uL   nRBC 0.0 0.0 - 0.2 %    Comment: Performed at [facility], [address] [place], [place] [address]  Troponin I - ONCE - STAT     Status: None   Collection Time: 10/21/18 12:08 AM  Result Value Ref Range   Troponin I <0.03 <0.03 ng/mL    Comment: Performed at [facility], [address] [place], [place] [address]  APTT     Status: None  Collection Time: 10/21/18 12:08 AM  Result Value Ref Range   aPTT 26 24 - 36 seconds    Comment: Performed at [facility], [address] [place], [place] [address]  Protime-INR     Status: None   Collection Time: 10/21/18 12:08 AM  Result Value Ref Range   Prothrombin Time 13.0 11.4 - 15.2 seconds   INR 1.0 0.8 - 1.2    Comment: (NOTE) INR goal varies based on device and disease states. Performed at [facility], [address] [place], [place] [address]   Heparin level (unfractionated)     Status: None   Collection Time: 10/21/18 12:08 AM  Result Value Ref Range   Heparin Unfractionated 0.58 0.30 - 0.70 IU/mL    Comment: (NOTE) If heparin results are below expected values, and patient dosage has  been confirmed, suggest follow up testing of antithrombin III levels. Performed at [facility], [address] [place], [place] [address]   TSH     Status: None   Collection Time: 10/21/18  5:04 AM  Result Value Ref Range   TSH 4.060 0.350 - 4.500 uIU/mL    Comment: Performed by a 3rd Generation assay with a functional sensitivity of <=0.01 uIU/mL. Performed at [facility], [address] [place], [place] [address]   Hemoglobin A1c     Status: None   Collection Time: 10/21/18  5:04 AM  Result Value Ref Range   Hgb A1c MFr Bld 5.3 4.8 - 5.6 %    Comment: (NOTE) Pre diabetes:          5.7%-6.4% Diabetes:              >6.4% Glycemic control for   <7.0% adults with diabetes    Mean Plasma Glucose 105.41 mg/dL    Comment: Performed at [facility], [address] [place], [place] [address]  Troponin I - Now Then Q6H     Status: None   Collection Time: 10/21/18  5:04 AM  Result Value Ref Range   Troponin I <0.03 <0.03 ng/mL    Comment: Performed at [facility], [address] [place], [place] [address]  Heparin level (unfractionated)     Status: Abnormal   Collection Time: 10/21/18 10:49 AM  Result Value Ref Range   Heparin Unfractionated 0.89 (H) 0.30 - 0.70 IU/mL    Comment: (NOTE) If heparin results are below expected values, and patient dosage has  been confirmed, suggest follow up testing of antithrombin III levels. Performed at [facility], [address] [place], [place] [address]   APTT     Status: Abnormal   Collection Time: 10/21/18 10:49 AM  Result Value Ref Range   aPTT 75 (H) 24 - 36 seconds    Comment:        IF BASELINE aPTT IS ELEVATED, SUGGEST PATIENT RISK ASSESSMENT BE USED TO DETERMINE APPROPRIATE ANTICOAGULANT THERAPY. Performed at [facility], [address] [place], [place] [address]   Troponin I - Now Then Q6H     Status: None   Collection Time: 10/21/18 10:49 AM  Result Value Ref Range   Troponin I <0.03 <0.03 ng/mL    Comment: Performed at [facility], [address] [place],  [place] [address]    Dg Chest 2 View  Result Date: 10/21/2018 CLINICAL DATA:  Chest pressure EXAM: CHEST - 2 VIEW COMPARISON:  10/15/2018 FINDINGS: A dual chamber left-sided pacemaker is noted. The heart  size is enlarged. There is no pneumothorax. No large pleural effusion. The lungs are somewhat hyperexpanded. There is slightly prominent interstitial lung markings bilaterally which is stable from prior study. Aortic calcifications are noted. IMPRESSION: No active cardiopulmonary disease. Electronically Signed   By: Constance Holster M.D.   On: 10/21/2018 00:45    Review of Systems  Constitutional: Positive for malaise/fatigue.  HENT: Negative.   Eyes: Negative.   Cardiovascular: Positive for chest pain.  Gastrointestinal: Negative.   Genitourinary: Negative.   Musculoskeletal: Negative.   Skin: Negative.   Neurological: Positive for weakness.  Endo/Heme/Allergies: Negative.   Psychiatric/Behavioral: Negative.    Blood pressure (!) 146/81, pulse 73, temperature 98.3 F (36.8 C), temperature source Oral, resp. rate 19, height 6\' 2"  (1.88 m), weight 100.6 kg, SpO2 100 %. Physical Exam  Nursing note and vitals reviewed. Constitutional: He is oriented to person, place, and time. He appears well-developed and well-nourished.  HENT:  Head: Normocephalic and atraumatic.  Eyes: Pupils are equal, round, and reactive to light. Conjunctivae and EOM are normal.  Neck: Normal range of motion. Neck supple.  Cardiovascular: Normal rate, regular rhythm and normal heart sounds.  Respiratory: Effort normal and breath sounds normal.  GI: Soft. Bowel sounds are normal.  Musculoskeletal: Normal range of motion.  Neurological: He is alert and oriented to person, place, and time. He has normal reflexes.  Skin: Skin is warm and dry.  Psychiatric: He has a normal mood and affect.    Assessment/Plan: Persistent chest pain Possible unstable angina Hypertension Hyperlipidemia Paroxysmal A. Fib Chronic renal  insufficiency . Plan We will admit to telemetry ROMI Myocardial infarction follow-up cardiac enzymes and EKGs Hold Eliquis Continue IV heparin therapy for anticoagulation Continue amiodarone for rhythm control Agree with simvastatin therapy for hyperlipidemia Continue amlodipine for blood pressure management and control Consider cardiac cath prior to discharge Recommend nephrology input for precath evaluation     Carvel Huskins D Ellie Bryand 10/21/2018, 12:02 PM

## 2018-10-21 NOTE — Progress Notes (Signed)
ANTICOAGULATION CONSULT NOTE - Initial Consult  Pharmacy Consult for Heparin Drip Indication: chest pain/ACS  No Known Allergies  Patient Measurements: Height: 6\' 2"  (188 cm) Weight: 221 lb 12.8 oz (100.6 kg) IBW/kg (Calculated) : 82.2 Heparin Dosing Weight: 99.8 kg  Vital Signs: Temp: 98.3 F (36.8 C) (06/14 0741) Temp Source: Oral (06/14 0741) BP: 146/81 (06/14 0741) Pulse Rate: 73 (06/14 0741)  Labs: Recent Labs    10/21/18 0008 10/21/18 0504 10/21/18 1049  HGB 12.9*  --   --   HCT 38.6*  --   --   PLT 144*  --   --   APTT 26  --  75*  LABPROT 13.0  --   --   INR 1.0  --   --   HEPARINUNFRC 0.58  --  0.89*  CREATININE 2.52*  --   --   TROPONINI <0.03 <0.03 <0.03    Estimated Creatinine Clearance: 34.1 mL/min (A) (by C-G formula based on SCr of 2.52 mg/dL (H)).   Medical History: Past Medical History:  Diagnosis Date  . Gout   . Hyperlipidemia   . Hypertension   . Paroxysmal atrial fibrillation [facility]    Assessment: Patient is a 73yo male admitted for chest pain/STEMI. Pharmacy consulted for Heparin drip dosing. Noted that patient taking Apixaban 5mg  BID prior to admission, last dose was taken 6/13 at 05:30. Basleine: aptt 26, HL 0.58 (elevated),   6/14 @1049  aPTT 75.   Goal of Therapy:  Heparin level 0.3-0.7 units/ml when correlates with aPTT.  aPTT 66-102 seconds Monitor platelets by anticoagulation protocol: Yes   Plan:  Will use aPTT to guide therapy. Baseline heparin level was elevated. APTT is within goal range. Pt received Heparin 4000 units IV bolus followed by Heparin 1300 units/hr. Will continue current rate. Will check aPTT 8 hours . Daily CBC while on Heparin drip. Will check HL with AM labs. Once HL correlates will switch to HL.   Eleonore Chiquito, PharmD, BCPS 10/21/2018 12:26 PM

## 2018-10-21 NOTE — Progress Notes (Signed)
PHARMACIST - PHYSICIAN ORDER COMMUNICATION  CONCERNING: P&T Medication Policy on Herbal Medications  DESCRIPTION:  This patient's order for:  Saw Palmetto  has been noted.  This product(s) is classified as an "herbal" or natural product. Due to a lack of definitive safety studies or FDA approval, nonstandard manufacturing practices, plus the potential risk of unknown drug-drug interactions while on inpatient medications, the Pharmacy and Therapeutics Committee does not permit the use of "herbal" or natural products of this type within [facility].   ACTION TAKEN: The pharmacy department is unable to verify this order at this time and your patient has been informed of this safety policy. Please reevaluate patient's clinical condition at discharge and address if the herbal or natural product(s) should be resumed at that time.  Paulina Fusi, PharmD, BCPS 10/21/2018 4:47 AM

## 2018-10-21 NOTE — Progress Notes (Signed)
Family Meeting Note  Advance Directive:yes  Today a meeting took place with the Patient.   The following clinical team members were present during this meeting:MD  The following were discussed:Patient's diagnosis: Angina versus musculoskeletal pain, acute kidney injury on chronic kidney disease hypertension, chronic atrial fibrillation, treatment plan of care discussed in detail with the patient and his daughter over phone.  They both verbalized understanding of the plan   patient's progosis: Unable to determine and Goals for treatment: Full Code.  Wife and daughter Elsie Ra is the healthcare power of attorney  Additional follow-up to be provided: Hospitalist, cardiology  Time spent during discussion:17 min  Nicholes Mango, MD

## 2018-10-21 NOTE — Progress Notes (Addendum)
Update 0447: Pt was admitted on the floor without any distress. VSS were stable. Pt was place on 2 liters oxygen for comfort. Call bell and telephone are within reach of pts. Pt refused the bed alarm, but was educated about safety. Will continue to monitor.   Pt still reporting a 10 out 10 chest pain upon admission on the floor. Notify prime. Will continue to monitor.  Update 0605: Dr. Marcille Blanco states will place order. Will continue to monitor.

## 2018-10-21 NOTE — Consult Note (Signed)
[facility] CONSULT NOTE    Date: 10/21/2018                  Patient Name:  Mike Townsend  MRN: [id number]  DOB: 1945-11-17  Age / Sex: 73 y.o., male         PCP: Danella PentonMiller, Mark F, MD                 Service Requesting Consult: Cardiology, Dr. Juliann Paresallwood                 Reason for Consult: Acute renal failure/CKD stage III            History of Present Illness: Patient is a 73 y.o. male with a PMHx of gout, hyperlipidemia, hypertension, paroxysmal atrial fibrillation, chronic kidney disease stage III baseline creatinine 1.2, who was admitted to [facility] on 10/20/2018 for evaluation of chest pain.  Patient reports that he was awoken from sleep from this chest pain.  The pain was radiating throughout the chest.  This was also associated with shortness of breath and exertion.  Troponins were checked x3 and were negative.  Cardiology is on the case and they are planning cardiac catheterization.  However it was noted that he has acute renal failure with a BUN of 49 and creatinine of 2.5.  He received 1 dose of Toradol.  No new renal function testing available today.  Renal ultrasound was performed and was unremarkable with the exception of a 10 mm cyst in the left kidney.  He denies chronic ingestion of NSAIDs.   Medications: Outpatient medications: Medications Prior to Admission  Medication Sig Dispense Refill Last Dose  . allopurinol (ZYLOPRIM) 300 MG tablet Take 300 mg by mouth daily.   10/20/2018 at Unknown time  . amiodarone (PACERONE) 200 MG tablet Take 100 mg by mouth daily.    10/20/2018 at Unknown time  . amLODipine (NORVASC) 5 MG tablet Take 5 mg by mouth every evening.    Past Week at Unknown time  . ELIQUIS 5 MG TABS tablet Take 5 mg by mouth 2 (two) times a day.   10/20/2018 at 0530  . magnesium oxide (MAG-OX) 400 MG tablet Take 400 mg by mouth daily.   10/20/2018 at Unknown time  . Saw Palmetto, Serenoa repens, (SAW PALMETTO PO) Take 1 capsule by mouth daily.    10/20/2018 at Unknown time  . simvastatin (ZOCOR) 20 MG tablet Take 20 mg by mouth at bedtime.   Past Week at Unknown time    Current medications: Current Facility-Administered Medications  Medication Dose Route Frequency Provider Last Rate Last Dose  . 0.9 %  sodium chloride infusion   Intravenous Continuous Arnaldo Nataliamond, Michael S, MD 125 mL/hr at 10/21/18 1328    . 0.9 %  sodium chloride infusion  250 mL Intravenous PRN Callwood, Dwayne D, MD      . acetaminophen (TYLENOL) tablet 650 mg  650 mg Oral Q6H PRN Arnaldo Nataliamond, Michael S, MD       Or  . acetaminophen (TYLENOL) suppository 650 mg  650 mg Rectal Q6H PRN Arnaldo Nataliamond, Michael S, MD      . amiodarone (PACERONE) tablet 100 mg  100 mg Oral Daily Arnaldo Nataliamond, Michael S, MD   100 mg at 10/21/18 0830  . amLODipine (NORVASC) tablet 5 mg  5 mg Oral QPM Arnaldo Nataliamond, Michael S, MD      . Melene Muller[START ON 10/22/2018] aspirin chewable tablet 81 mg  81 mg Oral Pre-Cath Callwood, Dwayne D,  MD      . cyclobenzaprine (FLEXERIL) tablet 5 mg  5 mg Oral BID PRN Gouru, Aruna, MD      . docusate sodium (COLACE) capsule 100 mg  100 mg Oral BID Arnaldo Natal, MD      . heparin ADULT infusion 100 units/mL (25000 units/240mL sodium chloride 0.45%)  1,300 Units/hr Intravenous Continuous Arnaldo Natal, MD 13 mL/hr at 10/21/18 0251 1,300 Units/hr at 10/21/18 0251  . magnesium oxide (MAG-OX) tablet 400 mg  400 mg Oral Daily Arnaldo Natal, MD   400 mg at 10/21/18 0831  . morphine 2 MG/ML injection 2 mg  2 mg Intravenous Q4H PRN Arnaldo Natal, MD   2 mg at 10/21/18 1610  . nitroGLYCERIN (NITROGLYN) 2 % ointment 0.5 inch  0.5 inch Topical Q6H Gouru, Aruna, MD   0.5 inch at 10/21/18 1310  . nitroGLYCERIN (NITROSTAT) SL tablet 0.4 mg  0.4 mg Sublingual Q5 min PRN Gouru, Aruna, MD      . ondansetron (ZOFRAN) tablet 4 mg  4 mg Oral Q6H PRN Arnaldo Natal, MD       Or  . ondansetron [facility]) injection 4 mg  4 mg Intravenous Q6H PRN Arnaldo Natal, MD      . pantoprazole  (PROTONIX) EC tablet 40 mg  40 mg Oral Daily Gouru, Aruna, MD   40 mg at 10/21/18 1333  . simvastatin (ZOCOR) tablet 20 mg  20 mg Oral QHS Arnaldo Natal, MD      . sodium chloride flush (NS) 0.9 % injection 3 mL  3 mL Intravenous Q12H Gouru, Aruna, MD      . sodium chloride flush (NS) 0.9 % injection 3 mL  3 mL Intravenous PRN Gouru, Aruna, MD      . sodium chloride flush (NS) 0.9 % injection 3 mL  3 mL Intravenous Q12H Callwood, Dwayne D, MD          Allergies: No Known Allergies    Past Medical History: Past Medical History:  Diagnosis Date  . Gout   . Hyperlipidemia   . Hypertension   . Paroxysmal atrial fibrillation [facility])      Past Surgical History: Past Surgical History:  Procedure Laterality Date  . LAPAROSCOPIC GASTRIC BYPASS    . LUMBAR LAMINECTOMY    . PACEMAKER INSERTION N/A 03/22/2016   Procedure: INSERTION PACEMAKER;  Surgeon: Marcina Millard, MD;  Location: [facility] ORS;  Service: Cardiovascular;  Laterality: N/A;  . TONSILLECTOMY       Family History: Family History  Problem Relation Age of Onset  . CAD Father      Social History: Social History   Socioeconomic History  . Marital status: Married    Spouse name: Not on file  . Number of children: Not on file  . Years of education: Not on file  . Highest education level: Not on file  Occupational History  . Not on file  Social Needs  . Financial resource strain: Not on file  . Food insecurity    Worry: Not on file    Inability: Not on file  . Transportation needs    Medical: Not on file    Non-medical: Not on file  Tobacco Use  . Smoking status: Light Tobacco Smoker    Types: Pipe  . Smokeless tobacco: Never Used  Substance and Sexual Activity  . Alcohol use: Yes    Comment: wine occ  . Drug use: No  . Sexual activity: Not on  file  Lifestyle  . Physical activity    Days per week: Not on file    Minutes per session: Not on file  . Stress: Not on file  Relationships  . Social  Musicianconnections    Talks on phone: Not on file    Gets together: Not on file    Attends religious service: Not on file    Active member of club or organization: Not on file    Attends meetings of clubs or organizations: Not on file    Relationship status: Not on file  . Intimate partner violence    Fear of current or ex partner: Not on file    Emotionally abused: Not on file    Physically abused: Not on file    Forced sexual activity: Not on file  Other Topics Concern  . Not on file  Social History Narrative  . Not on file     Review of Systems: Review of Systems  Constitutional: Negative for chills, fever and malaise/fatigue.  HENT: Negative for congestion, hearing loss and tinnitus.   Eyes: Negative for blurred vision and double vision.  Respiratory: Positive for shortness of breath. Negative for cough and hemoptysis.   Cardiovascular: Positive for chest pain. Negative for leg swelling and PND.  Gastrointestinal: Negative for blood in stool, heartburn and melena.  Genitourinary: Negative for dysuria and urgency.  Musculoskeletal: Negative for joint pain and myalgias.  Skin: Negative for itching and rash.  Neurological: Negative for dizziness and weakness.  Endo/Heme/Allergies: Negative for polydipsia. Does not bruise/bleed easily.  Psychiatric/Behavioral: Negative for depression. The patient is not nervous/anxious.      Vital Signs: Blood pressure 112/70, pulse (!) 58, temperature 98.9 F (37.2 C), temperature source Oral, resp. rate 17, height 6\' 2"  (1.88 m), weight 100.6 kg, SpO2 100 %.  Weight trends: Filed Weights   10/20/18 2341 10/21/18 0443  Weight: 99.8 kg 100.6 kg    Physical Exam: General: NAD,   Head: Normocephalic, atraumatic.  Eyes: Anicteric, EOMI  Nose: Mucous membranes moist, not inflammed, nonerythematous.  Throat: Oropharynx nonerythematous, no exudate appreciated.   Neck: Supple, trachea midline.  Lungs:  Normal respiratory effort. Clear to  auscultation BL without crackles or wheezes.  Heart: RRR. S1 and S2 normal without gallop, murmur, or rubs.  Abdomen:  BS normoactive. Soft, Nondistended, non-tender.  No masses or organomegaly.  Extremities: No pretibial edema.  Neurologic: A&O X3, Motor strength is 5/5 in the all 4 extremities  Skin: No visible rashes, scars.    Lab results: Basic Metabolic Panel: Recent Labs  Lab 10/21/18 0008  NA 140  K 4.3  CL 105  CO2 24  GLUCOSE 108*  BUN 49*  CREATININE 2.52*  CALCIUM 9.0    Liver Function Tests: No results for input(s): AST, ALT, ALKPHOS, BILITOT, PROT, ALBUMIN in the last 168 hours. No results for input(s): LIPASE, AMYLASE in the last 168 hours. No results for input(s): AMMONIA in the last 168 hours.  CBC: Recent Labs  Lab 10/21/18 0008  WBC 11.2*  HGB 12.9*  HCT 38.6*  MCV 113.9*  PLT 144*    Cardiac Enzymes: Recent Labs  Lab 10/21/18 0008 10/21/18 0504 10/21/18 1049  TROPONINI <0.03 <0.03 <0.03    BNP: Invalid input(s): POCBNP  CBG: No results for input(s): GLUCAP in the last 168 hours.  Microbiology: Results for orders placed or performed during the hospital encounter of 10/19/18  Novel Coronavirus, NAA (hospital order; send-out to ref lab)     Status:  None   Collection Time: 10/19/18 11:57 AM   Specimen: Nasopharyngeal Swab; Respiratory  Result Value Ref Range Status   SARS-CoV-2, NAA NOT DETECTED NOT DETECTED Final    Comment: (NOTE) This test was developed and its performance characteristics determined by [employer]. This test has not been FDA cleared or approved. This test has been authorized by FDA under an Emergency Use Authorization (EUA). This test is only authorized for the duration of time the declaration that circumstances exist justifying the authorization of the emergency use of in vitro diagnostic tests for detection of SARS-CoV-2 virus and/or diagnosis of COVID-19 infection under section 564(b)(1) of the Act,  21 U.S.C. 025KYH-0(W)(2), unless the authorization is terminated or revoked sooner. When diagnostic testing is negative, the possibility of a false negative result should be considered in the context of a patient's recent exposures and the presence of clinical signs and symptoms consistent with COVID-19. An individual without symptoms of COVID-19 and who is not shedding SARS-CoV-2 virus would expect to have a negative (not detected) result in this assay. Performed  At: [facility] [place], [place] [id number] Rush Farmer MD VO:[id number]    [place]  Final    Comment: Performed at [facility], [address] [place], [place] [address]    Coagulation Studies: Recent Labs    10/21/18 0008  LABPROT 13.0  INR 1.0    Urinalysis: No results for input(s): COLORURINE, LABSPEC, PHURINE, GLUCOSEU, HGBUR, BILIRUBINUR, KETONESUR, PROTEINUR, UROBILINOGEN, NITRITE, LEUKOCYTESUR in the last 72 hours.  Invalid input(s): APPERANCEUR    Imaging: Dg Chest 2 View  Result Date: 10/21/2018 CLINICAL DATA:  Chest pressure EXAM: CHEST - 2 VIEW COMPARISON:  10/15/2018 FINDINGS: A dual chamber left-sided pacemaker is noted. The heart size is enlarged. There is no pneumothorax. No large pleural effusion. The lungs are somewhat hyperexpanded. There is slightly prominent interstitial lung markings bilaterally which is stable from prior study. Aortic calcifications are noted. IMPRESSION: No active cardiopulmonary disease. Electronically Signed   By: Constance Holster M.D.   On: 10/21/2018 00:45   US Renal  Result Date: 10/21/2018 CLINICAL DATA:  Acute renal insufficiency EXAM: RENAL / URINARY TRACT ULTRASOUND COMPLETE COMPARISON:  None. FINDINGS: Right Kidney: Renal measurements: 11.1 x 4.8 x 5.5 cm = volume: 153 mL . Echogenicity within normal limits. No mass or hydronephrosis visualized. Left Kidney: Renal measurements: 11.6 x 5.5 x 5.3 cm =  volume: 170 mL. Contains a 10 mm cyst. Bladder: Appears normal for degree of bladder distention. IMPRESSION: Small 10 mm cysts on the left kidney.  No other abnormalities. Electronically Signed   By: Dorise Bullion III M.D   On: 10/21/2018 12:41      Assessment & Plan: Pt is a 73 y.o. male with a PMHx of gout, hyperlipidemia, hypertension, paroxysmal atrial fibrillation, chronic kidney disease stage III baseline creatinine 1.2, who was admitted to [facility] on 10/20/2018 for evaluation of chest pain.   1.  Acute renal failure/chronic kidney disease stage III.  Reason for acute renal failure unclear at the moment.  Renal ultrasound was unremarkable.  Continue IV fluid hydration.  We may need to hold off immediate plans for cardiac catheterization until his renal function stabilized.  This was discussed with Dr. Georgia Dom.  Follow-up renal parameters today as well as tomorrow.  2.   Hypertension.  Okay to continue the patient on amlodipine at this time.  3.  Chest pain.  Cardiac catheterization was being planned.  However in light  of his acute renal failure this may need to be delayed.  Continue 0.9 normal saline at 125 cc/h.

## 2018-10-21 NOTE — Plan of Care (Signed)
  Problem: Education: Goal: Knowledge of General Education information will improve Description: Including pain rating scale, medication(s)/side effects and non-pharmacologic comfort measures Outcome: Progressing   Problem: Activity: Goal: Risk for activity intolerance will decrease Outcome: Progressing   

## 2018-10-21 NOTE — ED Notes (Signed)
Floor called for pt arriving, no one available for transport prior to this time d/t pt load and heparin infusion

## 2018-10-21 NOTE — Progress Notes (Signed)
ANTICOAGULATION CONSULT NOTE - Initial Consult  Pharmacy Consult for Heparin Drip Indication: chest pain/ACS  No Known Allergies  Patient Measurements: Height: 6\' 2"  (188 cm) Weight: 221 lb 12.8 oz (100.6 kg) IBW/kg (Calculated) : 82.2 Heparin Dosing Weight: 99.8 kg  Vital Signs: Temp: 98.9 F (37.2 C) (06/14 1631) Temp Source: Oral (06/14 1631) BP: 112/70 (06/14 1631) Pulse Rate: 58 (06/14 1631)  Labs: Recent Labs    10/21/18 0008 10/21/18 0504 10/21/18 1049 10/21/18 1644 10/21/18 1853  HGB 12.9*  --   --   --   --   HCT 38.6*  --   --   --   --   PLT 144*  --   --   --   --   APTT 26  --  75*  --  104*  LABPROT 13.0  --   --   --   --   INR 1.0  --   --   --   --   HEPARINUNFRC 0.58  --  0.89*  --   --   CREATININE 2.52*  --   --   --  1.79*  TROPONINI <0.03 <0.03 <0.03 <0.03  --     Estimated Creatinine Clearance: 48 mL/min (A) (by C-G formula based on SCr of 1.79 mg/dL (H)).   Medical History: Past Medical History:  Diagnosis Date  . Gout   . Hyperlipidemia   . Hypertension   . Paroxysmal atrial fibrillation [facility])    Assessment: Patient is a 73yo male admitted for chest pain/STEMI. Pharmacy consulted for Heparin drip dosing. Noted that patient taking Apixaban 5mg  BID prior to admission, last dose was taken 6/13 at 05:30. Basleine: aptt 26, HL 0.58 (elevated),   6/14 @1049  aPTT 75.   Goal of Therapy:  Heparin level 0.3-0.7 units/ml when correlates with aPTT.  aPTT 66-102 seconds Monitor platelets by anticoagulation protocol: Yes   Plan:  Will use aPTT to guide therapy. Baseline heparin level was elevated. APTT is within goal range. Pt received Heparin 4000 units IV bolus followed by Heparin 1300 units/hr. Will continue current rate. Will check aPTT 8 hours . Daily CBC while on Heparin drip. Will check HL with AM labs. Once HL correlates will switch to HL.   6/14:  APTT @ 1853 = 104 Will decrease heparin drip to 1200 units/hr and recheck aPTT 8 hrs  after rate change.   Amitai Delaughter D 10/21/2018 7:54 PM

## 2018-10-21 NOTE — ED Notes (Signed)
Patient gave permission for Dr. Cherylann Banas and this writer to speak to the patient's wife and Daughter Ria Comment.

## 2018-10-21 NOTE — Progress Notes (Signed)
ANTICOAGULATION CONSULT NOTE - Initial Consult  Pharmacy Consult for Heparin Drip Indication: chest pain/ACS  No Known Allergies  Patient Measurements: Height: 6\' 2"  (188 cm) Weight: 220 lb (99.8 kg) IBW/kg (Calculated) : 82.2 Heparin Dosing Weight: 99.8 kg  Vital Signs: Temp: 98.3 F (36.8 C) (06/13 2334) Temp Source: Oral (06/13 2334) BP: 117/63 (06/14 0148) Pulse Rate: 69 (06/14 0148)  Labs: Recent Labs    10/21/18 0008  HGB 12.9*  HCT 38.6*  PLT 144*  CREATININE 2.52*  TROPONINI <0.03    Estimated Creatinine Clearance: 33.9 mL/min (A) (by C-G formula based on SCr of 2.52 mg/dL (H)).   Medical History: Past Medical History:  Diagnosis Date  . Gout   . Hyperlipidemia   . Hypertension   . Paroxysmal atrial fibrillation [facility])    Assessment: Patient is a 73yo male admitted for chest pain/STEMI. Pharmacy consulted for Heparin drip dosing. Noted that patient taking Apixaban 5mg  BID prior to admission, last dose was taken 6/13 at 05:30.  Goal of Therapy:  Heparin level 0.3-0.7 units/ml aPTT 66-102 seconds Monitor platelets by anticoagulation protocol: Yes   Plan:  Will check a baseline HL, anticipate that it will be elevated due to patient being on Apixaban prior to admission, and will need to use aPTT to guide therapy. Will order Heparin 4000 units IV bolus followed by Heparin 1300 units/hr. Will check aPTT 8 hours after start of infusion. Daily CBC while on Heparin drip. Will check HL at least daily.  Paulina Fusi, PharmD, BCPS 10/21/2018 2:33 AM

## 2018-10-21 NOTE — ED Notes (Signed)
ED TO INPATIENT HANDOFF REPORT  ED Nurse Name and Phone #: Barbette Hairoel RN, [phone]  S Name/Age/Gender Mike Townsend 73 y.o. male Room/Bed: ED16A/ED16A  Code Status   Code Status: Prior  Home/SNF/Other Rehab at home if possible Patient oriented to: self, place, time and situation Is this baseline? Yes   Triage Complete: Triage complete  Chief Complaint [facility] Chest Pain  Triage Note Per EMS report, patient c/o mid-sternal chest pressure since 2200 tonight. Patient is scheduled for a heart catherization next Wednesday. Patient was given 324mg  ASA and one spray of Nitro en route without any relief. Patient has a pacemaker.   Allergies No Known Allergies  Level of Care/Admitting Diagnosis ED Disposition    ED Disposition Condition Comment   Admit  [facility]ospital [facility] [[id number]]  Level of Care: Telemetry [5]  Covid Evaluation: Confirmed COVID Negative  Diagnosis: NSTEMI (non-ST elevated myocardial infarction) [facility](HCC) [[id number]]) [[id number]]  Admitting Physician: Arnaldo NatalIAMOND, MICHAEL S [[id number]]  Attending Physician: Arnaldo NatalDIAMOND, MICHAEL S [[id number]]  Estimated length of stay: past midnight tomorrow  Certification:: I certify this patient will need inpatient services for at least 2 midnights  PT Class (Do Not Modify): Inpatient [101]  PT Acc Code (Do Not Modify): Private [1]       B Medical/Surgery History Past Medical History:  Diagnosis Date  . Gout   . Hyperlipidemia   . Hypertension   . Paroxysmal atrial fibrillation [facility](HCC)    Past Surgical History:  Procedure Laterality Date  . LAPAROSCOPIC GASTRIC BYPASS    . LUMBAR LAMINECTOMY    . PACEMAKER INSERTION N/A 03/22/2016   Procedure: INSERTION PACEMAKER;  Surgeon: Marcina MillardAlexander Paraschos, MD;  Location: [facility] ORS;  Service: Cardiovascular;  Laterality: N/A;  . TONSILLECTOMY       A IV Location/Drains/Wounds Patient Lines/Drains/Airways Status   Active Line/Drains/Airways    Name:   Placement date:   Placement time:    Site:   Days:   Peripheral IV 03/22/16 Left Arm   03/22/16    1114    Arm   943   Peripheral IV 03/22/16 Right Arm   03/22/16    1115    Arm   943   Peripheral IV 10/21/18 Left Antecubital   10/21/18    0006    Antecubital   less than 1   Peripheral IV 10/21/18 Right Forearm   10/21/18    0230    Forearm   less than 1   Incision (Closed) 03/22/16 Chest   03/22/16    1225     943          Intake/Output Last 24 hours  Intake/Output Summary (Last 24 hours) at 10/21/2018 0307 Last data filed at 10/21/2018 0153 Gross per 24 hour  Intake 500 ml  Output 350 ml  Net 150 ml    Labs/Imaging Results for orders placed or performed during the hospital encounter of 10/20/18 (from the past 48 hour(s))  Basic metabolic panel     Status: Abnormal   Collection Time: 10/21/18 12:08 AM  Result Value Ref Range   Sodium 140 135 - 145 mmol/L   Potassium 4.3 3.5 - 5.1 mmol/L   Chloride 105 98 - 111 mmol/L   CO2 24 22 - 32 mmol/L   Glucose, Bld 108 (H) 70 - 99 mg/dL   BUN 49 (H) 8 - 23 mg/dL   Creatinine, Ser 2.132.52 (H) 0.61 - 1.24 mg/dL   Calcium 9.0 8.9 - 08.610.3 mg/dL   GFR calc non  Af Amer 25 (L) >60 mL/min   GFR calc Af Amer 29 (L) >60 mL/min   Anion gap 11 5 - 15    Comment: Performed at [facility], [address] [place], [place] [address]  CBC     Status: Abnormal   Collection Time: 10/21/18 12:08 AM  Result Value Ref Range   WBC 11.2 (H) 4.0 - 10.5 K/uL   RBC 3.39 (L) 4.22 - 5.81 MIL/uL   Hemoglobin 12.9 (L) 13.0 - 17.0 g/dL   HCT 38.6 (L) 39.0 - 52.0 %   MCV 113.9 (H) 80.0 - 100.0 fL   MCH 38.1 (H) 26.0 - 34.0 pg   MCHC 33.4 30.0 - 36.0 g/dL   RDW 13.9 11.5 - 15.5 %   Platelets 144 (L) 150 - 400 K/uL   nRBC 0.0 0.0 - 0.2 %    Comment: Performed at [facility], [address] [place], [place] [address]  Troponin I - ONCE - STAT     Status: None   Collection Time: 10/21/18 12:08 AM  Result Value Ref Range   Troponin I <0.03 <0.03 ng/mL    Comment: Performed at  [facility], [address] [place], [place] [address]  APTT     Status: None   Collection Time: 10/21/18 12:08 AM  Result Value Ref Range   aPTT 26 24 - 36 seconds    Comment: Performed at [facility], [address] [place], [place] [address]  Protime-INR     Status: None   Collection Time: 10/21/18 12:08 AM  Result Value Ref Range   Prothrombin Time 13.0 11.4 - 15.2 seconds   INR 1.0 0.8 - 1.2    Comment: (NOTE) INR goal varies based on device and disease states. Performed at [facility], [place], [place] [address]   Heparin level (unfractionated)     Status: None   Collection Time: 10/21/18 12:08 AM  Result Value Ref Range   Heparin Unfractionated 0.58 0.30 - 0.70 IU/mL    Comment: (NOTE) If heparin results are below expected values, and patient dosage has  been confirmed, suggest follow up testing of antithrombin III levels. Performed at [facility], [address] [place], [place] [address]    Dg Chest 2 View  Result Date: 10/21/2018 CLINICAL DATA:  Chest pressure EXAM: CHEST - 2 VIEW COMPARISON:  10/15/2018 FINDINGS: A dual chamber left-sided pacemaker is noted. The heart size is enlarged. There is no pneumothorax. No large pleural effusion. The lungs are somewhat hyperexpanded. There is slightly prominent interstitial lung markings bilaterally which is stable from prior study. Aortic calcifications are noted. IMPRESSION: No active cardiopulmonary disease. Electronically Signed   By: Constance Holster M.D.   On: 10/21/2018 00:45    Pending Labs Unresulted Labs (From admission, onward)    Start     Ordered   10/21/18 1100  Heparin level (unfractionated)  Once-Timed,   STAT     10/21/18 0253   10/21/18 1100  APTT  Once-Timed,   STAT     10/21/18 0253   Signed and Held  TSH  Add-on,   R     Signed and Held   Signed and Held  Hemoglobin A1c  Add-on,   R     Signed and Held   Signed and Held  Troponin I - Now  Then Q6H  Now then every 6 hours,   R     Signed and Held  Vitals/Pain Today's Vitals   10/20/18 2341 10/21/18 0147 10/21/18 0148 10/21/18 0253  BP:   117/63   Pulse:   69   Resp:   16   Temp:      TempSrc:      SpO2:   98%   Weight: 99.8 kg     Height: 6\' 2"  (1.88 m)     PainSc: 8  10-Worst pain ever 10-Worst pain ever 10-Worst pain ever    Isolation Precautions No active isolations  Medications Medications  heparin ADULT infusion 100 units/mL (25000 units/26650mL sodium chloride 0.45%) (1,300 Units/hr Intravenous New Bag/Given 10/21/18 0251)  fentaNYL (SUBLIMAZE) injection 50 mcg (50 mcg Intravenous Given 10/21/18 0016)  sodium chloride 0.9 % bolus 500 mL (0 mLs Intravenous Stopped 10/21/18 0147)  morphine 4 MG/ML injection 4 mg (4 mg Intravenous Given 10/21/18 0149)  heparin bolus via infusion 4,000 Units (4,000 Units Intravenous Bolus from Bag 10/21/18 0250)    Mobility walks Low fall risk   Focused Assessments Cardiac Assessment Handoff:  Cardiac Rhythm: A-V Sequential paced Lab Results  Component Value Date   TROPONINI <0.03 10/21/2018   No results found for: DDIMER Does the Patient currently have chest pain? Yes     R Recommendations: See Admitting Provider Note  Report given to:   Additional Notes: pt live with wife, wife aware of room assignment

## 2018-10-22 ENCOUNTER — Inpatient Hospital Stay: Payer: Medicare HMO

## 2018-10-22 ENCOUNTER — Encounter: Payer: Self-pay | Admitting: Radiology

## 2018-10-22 ENCOUNTER — Encounter
Admission: EM | Disposition: A | Discharge status: Home / Self Care | Payer: Self-pay | Source: Home / Self Care | Attending: Internal Medicine

## 2018-10-22 DIAGNOSIS — D539 Nutritional anemia, unspecified: Secondary | ICD-10-CM

## 2018-10-22 DIAGNOSIS — R0789 Other chest pain: Secondary | ICD-10-CM

## 2018-10-22 DIAGNOSIS — D696 Thrombocytopenia, unspecified: Secondary | ICD-10-CM

## 2018-10-22 DIAGNOSIS — I200 Unstable angina: Secondary | ICD-10-CM

## 2018-10-22 LAB — BASIC METABOLIC PANEL
Anion gap: 7 (ref 5–15)
BUN: 40 mg/dL — ABNORMAL HIGH (ref 8–23)
CO2: 22 mmol/L (ref 22–32)
Calcium: 8.1 mg/dL — ABNORMAL LOW (ref 8.9–10.3)
Chloride: 106 mmol/L (ref 98–111)
Creatinine, Ser: 1.5 mg/dL — ABNORMAL HIGH (ref 0.61–1.24)
GFR calc Af Amer: 54 mL/min — ABNORMAL LOW (ref >60)
GFR calc non Af Amer: 46 mL/min — ABNORMAL LOW (ref >60)
Glucose, Bld: 119 mg/dL — ABNORMAL HIGH (ref 70–99)
Potassium: 4.1 mmol/L (ref 3.5–5.1)
Sodium: 135 mmol/L (ref 135–145)

## 2018-10-22 LAB — FOLATE: Folate: 8.5 ng/mL (ref >5.9)

## 2018-10-22 LAB — IRON AND TIBC
Iron: 13 ug/dL — ABNORMAL LOW (ref 45–182)
Saturation Ratios: 7 % — ABNORMAL LOW (ref 17.9–39.5)
TIBC: 191 ug/dL — ABNORMAL LOW (ref 250–450)
UIBC: 178 ug/dL

## 2018-10-22 LAB — HEPATIC FUNCTION PANEL
ALT: 45 U/L — ABNORMAL HIGH (ref 0–44)
AST: 43 U/L — ABNORMAL HIGH (ref 15–41)
Albumin: 3.1 g/dL — ABNORMAL LOW (ref 3.5–5.0)
Alkaline Phosphatase: 43 U/L (ref 38–126)
Bilirubin, Direct: 0.2 mg/dL (ref 0.0–0.2)
Indirect Bilirubin: 0.8 mg/dL (ref 0.3–0.9)
Total Bilirubin: 1 mg/dL (ref 0.3–1.2)
Total Protein: 5.7 g/dL — ABNORMAL LOW (ref 6.5–8.1)

## 2018-10-22 LAB — CBC
HCT: 32.8 % — ABNORMAL LOW (ref 39.0–52.0)
Hemoglobin: 11.1 g/dL — ABNORMAL LOW (ref 13.0–17.0)
MCH: 37.6 pg — ABNORMAL HIGH (ref 26.0–34.0)
MCHC: 33.8 g/dL (ref 30.0–36.0)
MCV: 111.2 fL — ABNORMAL HIGH (ref 80.0–100.0)
Platelets: 119 10*3/uL — ABNORMAL LOW (ref 150–400)
RBC: 2.95 MIL/uL — ABNORMAL LOW (ref 4.22–5.81)
RDW: 14.1 % (ref 11.5–15.5)
WBC: 10.9 10*3/uL — ABNORMAL HIGH (ref 4.0–10.5)
nRBC: 0 % (ref 0.0–0.2)

## 2018-10-22 LAB — APTT: aPTT: 91 seconds — ABNORMAL HIGH (ref 24–36)

## 2018-10-22 LAB — HEPARIN LEVEL (UNFRACTIONATED)
Heparin Unfractionated: 0.56 IU/mL (ref 0.30–0.70)
Heparin Unfractionated: 0.3 IU/mL (ref 0.30–0.70)

## 2018-10-22 LAB — FERRITIN: Ferritin: 272 ng/mL (ref 24–336)

## 2018-10-22 LAB — VITAMIN B12: Vitamin B-12: 191 pg/mL (ref 180–914)

## 2018-10-22 SURGERY — LEFT HEART CATH AND CORONARY ANGIOGRAPHY
Anesthesia: Moderate Sedation

## 2018-10-22 MED ORDER — PANTOPRAZOLE SODIUM 40 MG PO TBEC
40.0000 mg | DELAYED_RELEASE_TABLET | Freq: Two times a day (BID) | ORAL | Status: DC
  Administered 2018-10-22 – 2018-10-25 (×5): 40 mg via ORAL
  Filled 2018-10-22 (×5): qty 1

## 2018-10-22 MED ORDER — ACETAMINOPHEN 325 MG PO TABS
650.0000 mg | ORAL_TABLET | Freq: Four times a day (QID) | ORAL | Status: DC | PRN
  Administered 2018-10-22 (×3): 650 mg via ORAL
  Filled 2018-10-22 (×3): qty 2

## 2018-10-22 MED ORDER — CYANOCOBALAMIN 1000 MCG/ML IJ SOLN
1000.0000 ug | Freq: Every day | INTRAMUSCULAR | Status: AC
  Administered 2018-10-22 – 2018-10-23 (×2): 1000 ug via SUBCUTANEOUS
  Filled 2018-10-22 (×3): qty 1

## 2018-10-22 MED ORDER — ACETAMINOPHEN 650 MG RE SUPP: 650.0000 mg | Freq: Four times a day (QID) | RECTAL | Status: DC | PRN

## 2018-10-22 MED ORDER — TECHNETIUM TC 99M DIETHYLENETRIAME-PENTAACETIC ACID
32.5890 | Freq: Once | INTRAVENOUS | Status: AC | PRN
  Administered 2018-10-22: 32.589 via RESPIRATORY_TRACT

## 2018-10-22 MED ORDER — TECHNETIUM TO 99M ALBUMIN AGGREGATED
4.3560 | Freq: Once | INTRAVENOUS | Status: AC | PRN
  Administered 2018-10-22: 4.356 via INTRAVENOUS

## 2018-10-22 NOTE — Progress Notes (Signed)
[facility]  ROUNDING NOTE   Subjective:   V/Q negative  Objective:  Vital signs in last 24 hours:  Temp:  [98.4 F (36.9 C)-100.3 F (37.9 C)] 98.8 F (37.1 C) (06/15 1617) Pulse Rate:  [58-69] 67 (06/15 1617) Resp:  [17-20] 18 (06/15 1617) BP: (90-112)/(58-79) 108/79 (06/15 1617) SpO2:  [96 %-100 %] 97 % (06/15 1617) Weight:  [105.4 kg] 105.4 kg (06/15 0355)  Weight change: 5.625 kg Filed Weights   10/20/18 2341 10/21/18 0443 10/22/18 0355  Weight: 99.8 kg 100.6 kg 105.4 kg    Intake/Output: I/O last 3 completed shifts: In: 3951.7 [I.V.:3451.7; IV Piggyback:500] Out: 1950 [Urine:1950]   Intake/Output this shift:  Total I/O In: 958.6 [I.V.:958.6] Out: 450 [Urine:450]  Physical Exam: General: NAD,   Head: Normocephalic, atraumatic. Moist oral mucosal membranes  Eyes: Anicteric, PERRL  Neck: Supple, trachea midline  Lungs:  Clear to auscultation  Heart: Regular rate and rhythm  Abdomen:  Soft, nontender,   Extremities: no peripheral edema.  Neurologic: Nonfocal, moving all four extremities  Skin: No lesions        Basic Metabolic Panel: Recent Labs  Lab 10/21/18 0008 10/21/18 1853 10/22/18 0456  NA 140 135 135  K 4.3 4.3 4.1  CL 105 103 106  CO2 24 22 22   GLUCOSE 108* 126* 119*  BUN 49* 41* 40*  CREATININE 2.52* 1.79* 1.50*  CALCIUM 9.0 8.3* 8.1*    Liver Function Tests: Recent Labs  Lab 10/22/18 0451  AST 43*  ALT 45*  ALKPHOS 43  BILITOT 1.0  PROT 5.7*  ALBUMIN 3.1*   No results for input(s): LIPASE, AMYLASE in the last 168 hours. No results for input(s): AMMONIA in the last 168 hours.  CBC: Recent Labs  Lab 10/21/18 0008 10/22/18 0456  WBC 11.2* 10.9*  HGB 12.9* 11.1*  HCT 38.6* 32.8*  MCV 113.9* 111.2*  PLT 144* 119*    Cardiac Enzymes: Recent Labs  Lab 10/21/18 0008 10/21/18 0504 10/21/18 1049 10/21/18 1644 10/21/18 2251  TROPONINI <0.03 <0.03 <0.03 <0.03 <0.03    BNP: Invalid input(s):  POCBNP  CBG: No results for input(s): GLUCAP in the last 168 hours.  Microbiology: Results for orders placed or performed during the hospital encounter of 10/19/18  Novel Coronavirus, NAA (hospital order; send-out to ref lab)     Status: None   Collection Time: 10/19/18 11:57 AM   Specimen: Nasopharyngeal Swab; Respiratory  Result Value Ref Range Status   SARS-CoV-2, NAA NOT DETECTED NOT DETECTED Final    Comment: (NOTE) This test was developed and its performance characteristics determined by [facility]. This test has not been FDA cleared or approved. This test has been authorized by FDA under an Emergency Use Authorization (EUA). This test is only authorized for the duration of time the declaration that circumstances exist justifying the authorization of the emergency use of in vitro diagnostic tests for detection of SARS-CoV-2 virus and/or diagnosis of COVID-19 infection under section 564(b)(1) of the Act, 21 U.S.C. 409WJX-9(J)(4360bbb-3(b)(1), unless the authorization is terminated or revoked sooner. When diagnostic testing is negative, the possibility of a false negative result should be considered in the context of a patient's recent exposures and the presence of clinical signs and symptoms consistent with COVID-19. An individual without symptoms of COVID-19 and who is not shedding SARS-CoV-2 virus would expect to have a negative (not detected) result in this assay. Performed  At: [facility] [place] [address] [place], [place] [id number] Jolene SchimkeNagendra Sanjai MD YQ:[phone]  Coronavirus Source NASOPHARYNGEAL  Final    Comment: Performed at [facility], [address] [place], [place] [id number]    Coagulation Studies: Recent Labs    10/21/18 0008  LABPROT 13.0  INR 1.0    Urinalysis: No results for input(s): COLORURINE, LABSPEC, PHURINE, GLUCOSEU, HGBUR, BILIRUBINUR, KETONESUR, PROTEINUR, UROBILINOGEN, NITRITE, LEUKOCYTESUR in the last 72  hours.  Invalid input(s): APPERANCEUR    Imaging: Dg Chest 2 View  Result Date: 10/22/2018 CLINICAL DATA:  Chest pain EXAM: CHEST - 2 VIEW COMPARISON:  10/21/2018 FINDINGS: There are new, small bilateral pleural effusions and/or atelectasis appreciated on lateral view. No other airspace opacity. Redemonstrated cardiomegaly with left chest multi lead pacer. IMPRESSION: There are new, small bilateral pleural effusions and/or atelectasis appreciated on lateral view. No other airspace opacity. Redemonstrated cardiomegaly with left chest multi lead pacer. Electronically Signed   By: Lauralyn PrimesAlex  Bibbey M.D.   On: 10/22/2018 13:29   Dg Chest 2 View  Result Date: 10/21/2018 CLINICAL DATA:  Chest pressure EXAM: CHEST - 2 VIEW COMPARISON:  10/15/2018 FINDINGS: A dual chamber left-sided pacemaker is noted. The heart size is enlarged. There is no pneumothorax. No large pleural effusion. The lungs are somewhat hyperexpanded. There is slightly prominent interstitial lung markings bilaterally which is stable from prior study. Aortic calcifications are noted. IMPRESSION: No active cardiopulmonary disease. Electronically Signed   By: Katherine Mantlehristopher  Green M.D.   On: 10/21/2018 00:45   [place] Abdomen Complete  Result Date: 10/22/2018 CLINICAL DATA:  Thrombocytopenia. EXAM: ABDOMEN ULTRASOUND COMPLETE COMPARISON:  Ultrasound 10/21/2018. FINDINGS: Gallbladder: No gallstones or wall thickening visualized. No sonographic Murphy sign noted by sonographer. Common bile duct: Diameter: 5.0 mm Liver: Increased echogenicity consistent fatty infiltration or hepatocellular disease. Portal vein is patent on color Doppler imaging with normal direction of blood flow towards the liver. IVC: No abnormality visualized. Pancreas: Visualized portion unremarkable. Spleen: Size and appearance within normal limits. Multiple small echogenic foci noted most consistent calcified granulomas. Right Kidney: Length: 11.2 cm. Echogenicity within normal limits.  No mass or hydronephrosis visualized. Left Kidney: Length: 12.2 cm. Echogenicity within normal limits. No hydronephrosis visualized. 1.2 cm simple cyst. Abdominal aorta: No aneurysm visualized. Other findings: None. IMPRESSION: 1. Increased hepatic echogenicity consistent fatty infiltration or hepatocellular disease. 2.  No gallstones or biliary distention. 3.  1.2 cm simple cyst left kidney. Electronically Signed   By: Maisie Fushomas  Register   On: 10/22/2018 15:10   [place] Renal  Result Date: 10/21/2018 CLINICAL DATA:  Acute renal insufficiency EXAM: RENAL / URINARY TRACT ULTRASOUND COMPLETE COMPARISON:  None. FINDINGS: Right Kidney: Renal measurements: 11.1 x 4.8 x 5.5 cm = volume: 153 mL . Echogenicity within normal limits. No mass or hydronephrosis visualized. Left Kidney: Renal measurements: 11.6 x 5.5 x 5.3 cm = volume: 170 mL. Contains a 10 mm cyst. Bladder: Appears normal for degree of bladder distention. IMPRESSION: Small 10 mm cysts on the left kidney.  No other abnormalities. Electronically Signed   By: Gerome Samavid  Williams III M.D   On: 10/21/2018 12:41   Nm Pulmonary Perf And Vent  Result Date: 10/22/2018 CLINICAL DATA:  Chest pain. EXAM: NUCLEAR MEDICINE VENTILATION - PERFUSION LUNG SCAN TECHNIQUE: Ventilation images were obtained in multiple projections using inhaled aerosol Tc-1961m DTPA. Perfusion images were obtained in multiple projections after intravenous injection of Tc-6761m MAA. RADIOPHARMACEUTICALS:  32.589 mCi of Tc-5761m DTPA aerosol inhalation and 4.356 mCi Tc9661m MAA IV COMPARISON:  Chest x-ray dated 10/22/2018 FINDINGS: Ventilation: No focal ventilation defect. Perfusion: No wedge shaped  peripheral perfusion defects to suggest acute pulmonary embolism. IMPRESSION: Normal ventilation perfusion lung scan. No evidence of pulmonary embolism. Electronically Signed   By: Lorriane Shire M.D.   On: 10/22/2018 14:34     Medications:   . sodium chloride 125 mL/hr at 10/22/18 1501  . sodium chloride      . amiodarone  100 mg Oral Daily  . amLODipine  5 mg Oral QPM  . aspirin  81 mg Oral Pre-Cath  . cyanocobalamin  1,000 mcg Subcutaneous Daily  . docusate sodium  100 mg Oral BID  . lidocaine  1 patch Transdermal Q24H  . magnesium oxide  400 mg Oral Daily  . pantoprazole  40 mg Oral BID  . simvastatin  20 mg Oral QHS  . sodium chloride flush  3 mL Intravenous Q12H  . sodium chloride flush  3 mL Intravenous Q12H   sodium chloride, acetaminophen **OR** acetaminophen, cyclobenzaprine, morphine injection, nitroGLYCERIN, ondansetron **OR** ondansetron (ZOFRAN) IV, sodium chloride flush  Assessment/ Plan:  Mr. Mike Townsend is a 73 y.o.  male with gout, hyperlipidemia, hypertension, paroxysmal atrial fibrillation, chronic kidney disease stage III baseline creatinine 1.2, who was admitted to [facility] on 10/20/2018 for evaluation of chest pain.   1.  Acute renal failure on chronic kidney disease stage III.  Baseline creatinine 1.5, GFR of 46.  - IV normal saline infusion  2.   Hypertension.  - amlodipine  3. Anemia with chronic kidney disease: macrocytic. With thrombocytopenia - GI input appreciated.    LOS: 1 [place] Sara 6/15/20204:21 PM

## 2018-10-22 NOTE — Progress Notes (Signed)
PT Cancellation Note  Patient Details Name: Mike Townsend MRN: [id number] DOB: [birth year]   Cancelled Treatment:    Reason Eval/Treat Not Completed: Medical issues which prohibited therapy(Consult received and chart reviewed.  Patient currently pending VQ scan to rule out PE; will hold until test complete, results received.)   Belvia Gotschall H. Owens Shark, PT, DPT, NCS 10/22/18, 10:28 AM [phone]

## 2018-10-22 NOTE — Progress Notes (Signed)
[facility] [place] NAME: Mike Townsend    MR#:  [id number]  DATE OF BIRTH:  [birth year]  SUBJECTIVE:  CHIEF COMPLAINT: Patient is reporting significantly improved anterior chest wall pain.  Denies any abdominal pain nausea or vomiting.  Remote history of gastric erosions per endoscopy in 2012  REVIEW OF SYSTEMS:  CONSTITUTIONAL: No fever, fatigue or weakness.  EYES: No blurred or double vision.  EARS, NOSE, AND THROAT: No tinnitus or ear pain.  RESPIRATORY: No cough, shortness of breath, wheezing or hemoptysis.  CARDIOVASCULAR: Anterior chest wall  pain is improving, denies orthopnea, edema.  GASTROINTESTINAL: No nausea, vomiting, diarrhea or abdominal pain.  GENITOURINARY: No dysuria, hematuria.  ENDOCRINE: No polyuria, nocturia,  HEMATOLOGY: No anemia, easy bruising or bleeding SKIN: No rash or lesion. MUSCULOSKELETAL: No joint pain or arthritis.   NEUROLOGIC: No tingling, numbness, weakness.  PSYCHIATRY: No anxiety or depression.   DRUG ALLERGIES:  No Known Allergies  VITALS:  Blood pressure 108/61, pulse 61, temperature 98.7 F (37.1 C), temperature source Oral, resp. rate 19, height 6\' 2"  (1.88 m), weight 105.4 kg, SpO2 98 %.  PHYSICAL EXAMINATION:  GENERAL:  73 y.o.-year-old patient lying in the bed with no acute distress.  EYES: Pupils equal, round, reactive to light and accommodation. No scleral icterus. Extraocular muscles intact.  HEENT: Head atraumatic, normocephalic. Oropharynx and nasopharynx clear.  NECK:  Supple, no jugular venous distention. No thyroid enlargement, no tenderness.  LUNGS: Normal breath sounds bilaterally, no wheezing, rales,rhonchi or crepitation. No use of accessory muscles of respiration.  CARDIOVASCULAR: S1, S2 normal. No murmurs, rubs, or gallops.  Anterior chest wall tenderness on palpation is minimal ABDOMEN: Soft, nontender, nondistended. Bowel sounds present.  EXTREMITIES: No pedal  edema, cyanosis, or clubbing.  NEUROLOGIC: Cranial nerves II through XII are intact. Muscle strength at his baseline in all extremities. Sensation intact. Gait not checked.  PSYCHIATRIC: The patient is alert and oriented x 3.  SKIN: No obvious rash, lesion, or ulcer.    LABORATORY PANEL:   CBC Recent Labs  Lab 10/22/18 0456  WBC 10.9*  HGB 11.1*  HCT 32.8*  PLT 119*   ------------------------------------------------------------------------------------------------------------------  Chemistries  Recent Labs  Lab 10/22/18 0451 10/22/18 0456  NA  --  135  K  --  4.1  CL  --  106  CO2  --  22  GLUCOSE  --  119*  BUN  --  40*  CREATININE  --  1.50*  CALCIUM  --  8.1*  AST 43*  --   ALT 45*  --   ALKPHOS 43  --   BILITOT 1.0  --    ------------------------------------------------------------------------------------------------------------------  Cardiac Enzymes Recent Labs  Lab 10/21/18 2251  TROPONINI <0.03   ------------------------------------------------------------------------------------------------------------------  RADIOLOGY:  Dg Chest 2 View  Result Date: 10/22/2018 CLINICAL DATA:  Chest pain EXAM: CHEST - 2 VIEW COMPARISON:  10/21/2018 FINDINGS: There are new, small bilateral pleural effusions and/or atelectasis appreciated on lateral view. No other airspace opacity. Redemonstrated cardiomegaly with left chest multi lead pacer. IMPRESSION: There are new, small bilateral pleural effusions and/or atelectasis appreciated on lateral view. No other airspace opacity. Redemonstrated cardiomegaly with left chest multi lead pacer. Electronically Signed   By: Eddie Candle M.D.   On: 10/22/2018 13:29   Dg Chest 2 View  Result Date: 10/21/2018 CLINICAL DATA:  Chest pressure EXAM: CHEST - 2 VIEW COMPARISON:  10/15/2018 FINDINGS: A dual chamber left-sided pacemaker is noted. The heart size  is enlarged. There is no pneumothorax. No large pleural effusion. The lungs are  somewhat hyperexpanded. There is slightly prominent interstitial lung markings bilaterally which is stable from prior study. Aortic calcifications are noted. IMPRESSION: No active cardiopulmonary disease. Electronically Signed   By: Katherine Mantlehristopher  Green M.D.   On: 10/21/2018 00:45   [place] Abdomen Complete  Result Date: 10/22/2018 CLINICAL DATA:  Thrombocytopenia. EXAM: ABDOMEN ULTRASOUND COMPLETE COMPARISON:  Ultrasound 10/21/2018. FINDINGS: Gallbladder: No gallstones or wall thickening visualized. No sonographic Murphy sign noted by sonographer. Common bile duct: Diameter: 5.0 mm Liver: Increased echogenicity consistent fatty infiltration or hepatocellular disease. Portal vein is patent on color Doppler imaging with normal direction of blood flow towards the liver. IVC: No abnormality visualized. Pancreas: Visualized portion unremarkable. Spleen: Size and appearance within normal limits. Multiple small echogenic foci noted most consistent calcified granulomas. Right Kidney: Length: 11.2 cm. Echogenicity within normal limits. No mass or hydronephrosis visualized. Left Kidney: Length: 12.2 cm. Echogenicity within normal limits. No hydronephrosis visualized. 1.2 cm simple cyst. Abdominal aorta: No aneurysm visualized. Other findings: None. IMPRESSION: 1. Increased hepatic echogenicity consistent fatty infiltration or hepatocellular disease. 2.  No gallstones or biliary distention. 3.  1.2 cm simple cyst left kidney. Electronically Signed   By: Maisie Fushomas  Register   On: 10/22/2018 15:10   [place] Renal  Result Date: 10/21/2018 CLINICAL DATA:  Acute renal insufficiency EXAM: RENAL / URINARY TRACT ULTRASOUND COMPLETE COMPARISON:  None. FINDINGS: Right Kidney: Renal measurements: 11.1 x 4.8 x 5.5 cm = volume: 153 mL . Echogenicity within normal limits. No mass or hydronephrosis visualized. Left Kidney: Renal measurements: 11.6 x 5.5 x 5.3 cm = volume: 170 mL. Contains a 10 mm cyst. Bladder: Appears normal for degree of bladder  distention. IMPRESSION: Small 10 mm cysts on the left kidney.  No other abnormalities. Electronically Signed   By: Gerome Samavid  Williams III M.D   On: 10/21/2018 12:41   Nm Pulmonary Perf And Vent  Result Date: 10/22/2018 CLINICAL DATA:  Chest pain. EXAM: NUCLEAR MEDICINE VENTILATION - PERFUSION LUNG SCAN TECHNIQUE: Ventilation images were obtained in multiple projections using inhaled aerosol Tc-5771m DTPA. Perfusion images were obtained in multiple projections after intravenous injection of Tc-2671m MAA. RADIOPHARMACEUTICALS:  32.589 mCi of Tc-8771m DTPA aerosol inhalation and 4.356 mCi Tc1171m MAA IV COMPARISON:  Chest x-ray dated 10/22/2018 FINDINGS: Ventilation: No focal ventilation defect. Perfusion: No wedge shaped peripheral perfusion defects to suggest acute pulmonary embolism. IMPRESSION: Normal ventilation perfusion lung scan. No evidence of pulmonary embolism. Electronically Signed   By: Francene BoyersJames  Maxwell M.D.   On: 10/22/2018 14:34    EKG:   Orders placed or performed during the hospital encounter of 10/20/18  . ED EKG  . ED EKG    ASSESSMENT AND PLAN:   This is a 73 year old male admitted for chest pain #.  Chest pain- angina versus pleuritic chest pain versus musculoskeletal pain versus GERD Monitor patient on telemetry and troponins are negative so far-less than 0.033.  Discussed with cardiology Dr. Juliann Parescallwood  not recommending any interventions .  Patient is actually scheduled for cardiac cath on Wednesday  Paced rhythm therefore EKG difficult to determine ischemic changes.  Patient's chest pain significantly improved.  Troponins are negative and VQ scan is negative for pulmonary embolism discontinued heparin drip  Muscle relaxant as needed  #GERD with history of gastric erosions and GI bleed in 2012 Protonix, GI consult discussed with Dr. Allegra LaiVanga- unlikely  Recommending to continue Protonix 40 mg twice a day Outpatient follow-up with gastroenterology  in 1 to 2 weeks after  discharge  #Macrocytic anemia and thrombocytopenia Normal ferritin and folate levels ultrasound of the abdomen to evaluate liver and spleen-reveals fatty liver no gallstones Check B12 Outpatient hematology follow-up is recommended   #.  Acute kidney injury on chronic kidney disease- IV fluids, avoid nephrotoxins and discontinued allopurinol Renal ultrasound-no acute findings We will monitor intake and output. Foley catheter for close monitoring of the output Will consider renal consult if no improvement Nephrology is following Creatinine is improving 2.5-1.5   # Hypertension: Controlled; continue amlodipine  #   Atrial fibrillation: Rate controlled; continue amiodarone.  Resume Eliquis following PCI.  Patient scheduled for cardiac cath on Wednesday even prior to this admission  #.  Gout: Controlled; holding allopurinol  #Tobacco abuse disorder counseled patient to quit using pipes.  He is agreeable.  Will think about nicotine patch  .  DVT prophylaxis: Therapeutic anticoagulation  .  GI prophylaxis: PPI     All the records are reviewed and case discussed with Care Management/Social Workerr. Management plans discussed with the patient, daughter Lillia AbedLindsay over phone [phone] and they are in agreement.  CODE STATUS: fc   TOTAL TIME TAKING CARE OF THIS PATIENT: 43 minutes.   POSSIBLE D/C IN 1-2 DAYS, DEPENDING ON CLINICAL CONDITION.  Note: This dictation was prepared with Dragon dictation along with smaller phrase technology. Any transcriptional errors that result from this process are unintentional.   Ramonita LabAruna Kathrina Crosley M.D on 10/22/2018 at 3:47 PM  Between 7am to 6pm - Pager - [phone] After 6pm go to [email or web address] - password [email or web address] [facility]  [facility]  Office  [phone]  CC: Primary care physician; Danella PentonMiller, Mark F, MD

## 2018-10-22 NOTE — Progress Notes (Addendum)
ANTICOAGULATION CONSULT NOTE - Initial Consult  Pharmacy Consult for Heparin Drip Indication: chest pain/ACS  No Known Allergies  Patient Measurements: Height: 6\' 2"  (188 cm) Weight: 232 lb 6.4 oz (105.4 kg) IBW/kg (Calculated) : 82.2 Heparin Dosing Weight: 99.8 kg  Vital Signs: Temp: 98.8 F (37.1 C) (06/15 1617) Temp Source: Oral (06/15 1617) BP: 108/79 (06/15 1617) Pulse Rate: 67 (06/15 1617)  Labs: Recent Labs    10/21/18 0008  10/21/18 1049 10/21/18 1644 10/21/18 1853 10/21/18 2251 10/22/18 0456 10/22/18 1604  HGB 12.9*  --   --   --   --   --  11.1*  --   HCT 38.6*  --   --   --   --   --  32.8*  --   PLT 144*  --   --   --   --   --  119*  --   APTT 26  --  75*  --  104*  --  91*  --   LABPROT 13.0  --   --   --   --   --   --   --   INR 1.0  --   --   --   --   --   --   --   HEPARINUNFRC 0.58  --  0.89*  --   --   --  0.56 0.30  CREATININE 2.52*  --   --   --  1.79*  --  1.50*  --   TROPONINI <0.03   < > <0.03 <0.03  --  <0.03  --   --    < > = values in this interval not displayed.    Estimated Creatinine Clearance: 58.5 mL/min (A) (by C-G formula based on SCr of 1.5 mg/dL (H)).   Medical History: Past Medical History:  Diagnosis Date  . Gout   . Hyperlipidemia   . Hypertension   . Paroxysmal atrial fibrillation [facility])    Assessment: Patient is a 73yo male admitted for chest pain/STEMI. Pharmacy consulted for Heparin drip dosing. Noted that patient taking Apixaban 5mg  BID prior to admission, last dose was taken 6/13 at 05:30. Basleine: aptt 26, HL 0.58 (elevated),   Heparin 4000 unit bolus followed by 1300 units/hr 6/14 @1049  aPTT 75. HL 0.89 6/14 @ 1853 aPTT 104 heparin decreased to 1200 units/hr  6/15 @ 0456 aPTT 91 HL 0.56  6/15 @ 1604 HL 0.30    Goal of Therapy:  Heparin level 0.3-0.7 units/ml when correlates with aPTT.  aPTT 66-102 seconds Monitor platelets by anticoagulation protocol: Yes   Plan:  Heparin level seems to be  correlating with aPTT.   Have switched to HL to monitor heparin.  HL within goal range (borderline low - will follow).   Will continue current rate of 1200 units/hr. Will check HL in 8 hours for confirmation . Daily CBC while on Heparin drip.    Lu Duffel, PharmD, BCPS Clinical Pharmacist 10/22/2018 4:32 PM   Addendum - Heparin drip stopped in lieu of upcoming procedure

## 2018-10-22 NOTE — Plan of Care (Signed)
  Problem: Education: Goal: Knowledge of General Education information will improve Description: Including pain rating scale, medication(s)/side effects and non-pharmacologic comfort measures Outcome: Progressing   Problem: Health Behavior/Discharge Planning: Goal: Ability to manage health-related needs will improve Outcome: Progressing   Problem: Clinical Measurements: Goal: Ability to maintain clinical measurements within normal limits will improve Outcome: Progressing Goal: Will remain free from infection Outcome: Progressing Note: Remains afebrile Goal: Diagnostic test results will improve Outcome: Progressing Note: BUN 40/1.50, improved from admission labs Goal: Respiratory complications will improve Outcome: Progressing Goal: Cardiovascular complication will be avoided Outcome: Progressing Note: No arrhythmias overnight   Problem: Cardiac: Goal: Ability to achieve and maintain adequate cardiovascular perfusion will improve Outcome: Progressing Note: Possible cardiac cath tomorrow   Problem: Pain Managment: Goal: General experience of comfort will improve Outcome: Not Progressing Note: Prn medications

## 2018-10-22 NOTE — Plan of Care (Signed)
?  Problem: Education: ?Goal: Knowledge of General Education information will improve ?Description: Including pain rating scale, medication(s)/side effects and non-pharmacologic comfort measures ?Outcome: Progressing ?  ?Problem: Clinical Measurements: ?Goal: Respiratory complications will improve ?Outcome: Progressing ?Goal: Cardiovascular complication will be avoided ?Outcome: Progressing ?  ?Problem: Pain Managment: ?Goal: General experience of comfort will improve ?Outcome: Progressing ?  ?

## 2018-10-22 NOTE — Progress Notes (Signed)
To nuclear medicine via bed 

## 2018-10-22 NOTE — Plan of Care (Signed)
  Problem: Education: Goal: Knowledge of General Education information will improve Description: Including pain rating scale, medication(s)/side effects and non-pharmacologic comfort measures Outcome: Progressing   Problem: Skin Integrity: Goal: Risk for impaired skin integrity will decrease Outcome: Progressing   Problem: Education: Goal: Understanding of cardiac disease, CV risk reduction, and recovery process will improve Outcome: Progressing   Problem: Activity: Goal: Ability to tolerate increased activity will improve Outcome: Progressing

## 2018-10-22 NOTE — Consult Note (Signed)
Mike Darby, MD [address]  [place]  [place], [place] [address]  Main: [phone]  Fax: [phone] Pager: [phone]   Consultation  Referring Provider:     No ref. provider found Primary Care Physician:  Rusty Aus, MD Primary Gastroenterologist:   None      Reason for Consultation: Chest pain  Date of Admission:  10/20/2018 Date of Consultation:  10/22/2018         HPI:   Mike Townsend is a 73 y.o. male with history of hypertension, hyperlipidemia under control, A. fib on Eliquis, sick sinus syndrome, pacemaker admitted yesterday for work-up of chest pain.  Patient tells me that on Saturday at 8 PM he went to bed and at 10 PM he woke up with sudden onset of severe, stabbing retrosternal chest pain radiating to his neck as well as worsening of pain with breathing.  Patient is evaluated by cardiology, on heparin drip for anticoagulation for possible unstable angina, cardiac troponins have been negative. He is planned to undergo cardiac cath on Wednesday.  Patient had nuclear medicine myocardial perfusion scan on 10/09/2018 which was unremarkable.  He could not undergo CT scanning due to renal dysfunction.  Patient is empirically on Protonix 40 mg twice daily.  Patient has history of vertical banded gastroplasty is a weight loss surgery in 1975.  He reports that in 2012, he underwent EGD and colonoscopy after he had black stools.  He was told that he had gastric erosion only and he was on Nexium briefly.  It was thought to be secondary to full dose aspirin that he was taking at that time.  But later on, he discontinued aspirin completely.  He denies abdominal pain, nausea, vomiting, difficulty swallowing, black stools, rectal bleeding.  He also saw hematology several years ago for macrocytosis and etiology was not determined at that time.  Patient reports that for the last 6 months or so he has been experiencing fatigue and reports some weight loss which was intentional.   He reports that his chest pain is better today. He had normal hemoglobin at baseline, patient has been receiving maintenance IV fluids for his kidney dysfunction, his current hemoglobin is 11.1 with severe macrocytosis, MCV 111.2.  He does have mild thrombocytopenia.  He has normal ferritin and folate levels Patient does not smoke, occasional alcohol use only.  He does not have diabetes, functionally independent and active  NSAIDs: None  Antiplts/Anticoagulants/Anti thrombotics: Eliquis for A. fib on hold, currently on heparin drip  GI Procedures: EGD and colonoscopy in 2012 in [place] Colonoscopy reportedly normal, EGD revealed gastric erosion only  Past Medical History:  Diagnosis Date   Gout    Hyperlipidemia    Hypertension    Paroxysmal atrial fibrillation ([place])     Past Surgical History:  Procedure Laterality Date   LAPAROSCOPIC GASTRIC BYPASS     LUMBAR LAMINECTOMY     PACEMAKER INSERTION N/A 03/22/2016   Procedure: INSERTION PACEMAKER;  Surgeon: Isaias Cowman, MD;  Location: [facility] ORS;  Service: Cardiovascular;  Laterality: N/A;   TONSILLECTOMY      Prior to Admission medications   Medication Sig Start Date End Date Taking? Authorizing Provider  allopurinol (ZYLOPRIM) 300 MG tablet Take 300 mg by mouth daily.   Yes [provider]  amiodarone (PACERONE) 200 MG tablet Take 100 mg by mouth daily.    Yes [provider]  amLODipine (NORVASC) 5 MG tablet Take 5 mg by mouth every evening.  Yes [provider]  ELIQUIS 5 MG TABS tablet Take 5 mg by mouth 2 (two) times a day. 10/15/18  Yes [provider]  magnesium oxide (MAG-OX) 400 MG tablet Take 400 mg by mouth daily.   Yes [provider]  Saw Palmetto, Serenoa repens, (SAW PALMETTO PO) Take 1 capsule by mouth daily.   Yes [provider]  simvastatin (ZOCOR) 20 MG tablet Take 20 mg by mouth at bedtime.   Yes [provider]    Current  Facility-Administered Medications:    0.9 %  sodium chloride infusion, , Intravenous, Continuous, Arnaldo Nataliamond, Michael S, MD, Last Rate: 125 mL/hr at 10/22/18 0700   0.9 %  sodium chloride infusion, 250 mL, Intravenous, PRN, Callwood, Dwayne D, MD   acetaminophen (TYLENOL) tablet 650 mg, 650 mg, Oral, Q6H PRN, 650 mg at 10/22/18 0910 **OR** acetaminophen (TYLENOL) suppository 650 mg, 650 mg, Rectal, Q6H PRN, Gouru, Aruna, MD   amiodarone (PACERONE) tablet 100 mg, 100 mg, Oral, Daily, Arnaldo Nataliamond, Michael S, MD, 100 mg at 10/22/18 0918   amLODipine (NORVASC) tablet 5 mg, 5 mg, Oral, QPM, Arnaldo Nataliamond, Michael S, MD, 5 mg at 10/21/18 1709   aspirin chewable tablet 81 mg, 81 mg, Oral, Pre-Cath, Callwood, Dwayne D, MD   cyclobenzaprine (FLEXERIL) tablet 5 mg, 5 mg, Oral, BID PRN, Gouru, Aruna, MD   docusate sodium (COLACE) capsule 100 mg, 100 mg, Oral, BID, Arnaldo Nataliamond, Michael S, MD, 100 mg at 10/21/18 2230   heparin ADULT infusion 100 units/mL (25000 units/26650mL sodium chloride 0.45%), 1,200 Units/hr, Intravenous, Continuous, Gouru, Aruna, MD, Last Rate: 12 mL/hr at 10/22/18 0700, 1,200 Units/hr at 10/22/18 0700   lidocaine (LIDODERM) 5 % 1 patch, 1 patch, Transdermal, Q24H, Gouru, Aruna, MD, 1 patch at 10/21/18 1922   magnesium oxide (MAG-OX) tablet 400 mg, 400 mg, Oral, Daily, Arnaldo Nataliamond, Michael S, MD, 400 mg at 10/22/18 [id number]   morphine 2 MG/ML injection 2 mg, 2 mg, Intravenous, Q4H PRN, Arnaldo Nataliamond, Michael S, MD, 2 mg at 10/21/18 [id number]   nitroGLYCERIN (NITROSTAT) SL tablet 0.4 mg, 0.4 mg, Sublingual, Q5 min PRN, Gouru, Aruna, MD   ondansetron (ZOFRAN) tablet 4 mg, 4 mg, Oral, Q6H PRN **OR** ondansetron (ZOFRAN) injection 4 mg, 4 mg, Intravenous, Q6H PRN, Arnaldo Nataliamond, Michael S, MD   pantoprazole (PROTONIX) EC tablet 40 mg, 40 mg, Oral, BID, Gouru, Aruna, MD   simvastatin (ZOCOR) tablet 20 mg, 20 mg, Oral, QHS, Arnaldo Nataliamond, Michael S, MD, 20 mg at 10/21/18 2230   sodium chloride flush (NS) 0.9 % injection 3 mL, 3  mL, Intravenous, Q12H, Gouru, Aruna, MD   sodium chloride flush (NS) 0.9 % injection 3 mL, 3 mL, Intravenous, PRN, Gouru, Aruna, MD   sodium chloride flush (NS) 0.9 % injection 3 mL, 3 mL, Intravenous, Q12H, Callwood, Dwayne D, MD  Family History  Problem Relation Age of Onset   CAD Father      Social History   Tobacco Use   Smoking status: Light Tobacco Smoker    Types: Pipe   Smokeless tobacco: Never Used  Substance Use Topics   Alcohol use: Yes    Comment: wine occ   Drug use: No    Allergies as of 10/20/2018   (No Known Allergies)    Review of Systems:    All systems reviewed and negative except where noted in HPI.   Physical Exam:  Vital signs in last 24 hours: Temp:  [98.4 F (36.9 C)-100.3 F (37.9 C)] 98.7 F (37.1 C) (06/15 0743)  Pulse Rate:  [58-69] 61 (06/15 0743) Resp:  [17-20] 19 (06/15 0743) BP: (90-112)/(58-70) 108/61 (06/15 0743) SpO2:  [96 %-100 %] 98 % (06/15 0743) Weight:  [105.4 kg] 105.4 kg (06/15 0355) Last BM Date: 10/20/18 General:   Pleasant, cooperative in NAD Head:  Normocephalic and atraumatic. Eyes:   No icterus.   Conjunctiva pink. PERRLA. Ears:  Normal auditory acuity. Neck:  Supple; no masses or thyroidomegaly Lungs: Respirations even and unlabored. Lungs clear to auscultation bilaterally.   No wheezes, crackles, or rhonchi.  Heart:  Regular rate and rhythm;  Without murmur, clicks, rubs or gallops Abdomen:  Soft, nondistended, nontender. Normal bowel sounds. No appreciable masses or hepatomegaly.  No rebound or guarding.  Rectal:  Not performed. Msk:  Symmetrical without gross deformities.  Strength normal Extremities:  Without edema, cyanosis or clubbing. Neurologic:  Alert and oriented x3;  grossly normal neurologically. Psych:  Alert and cooperative. Normal affect.  LAB RESULTS: CBC Latest Ref Rng & Units 10/22/2018 10/21/2018 03/14/2016  WBC 4.0 - 10.5 K/uL 10.9(H) 11.2(H) 5.7  Hemoglobin 13.0 - 17.0 g/dL 11.1(L)  12.9(L) 14.1  Hematocrit 39.0 - 52.0 % 32.8(L) 38.6(L) 42.1  Platelets 150 - 400 K/uL 119(L) 144(L) 158    BMET BMP Latest Ref Rng & Units 10/22/2018 10/21/2018 10/21/2018  Glucose 70 - 99 mg/dL 960(A119(H) 540(J126(H) 811(B108(H)  BUN 8 - 23 mg/dL 14(N40(H) 82(N41(H) 56(O49(H)  Creatinine 0.61 - 1.24 mg/dL 1.30(Q1.50(H) 6.57(Q1.79(H) 4.69(G2.52(H)  Sodium 135 - 145 mmol/L 135 135 140  Potassium 3.5 - 5.1 mmol/L 4.1 4.3 4.3  Chloride 98 - 111 mmol/L 106 103 105  CO2 22 - 32 mmol/L 22 22 24   Calcium 8.9 - 10.3 mg/dL 8.1(L) 8.3(L) 9.0    LFT No flowsheet data found.   STUDIES: Dg Chest 2 View  Result Date: 10/21/2018 CLINICAL DATA:  Chest pressure EXAM: CHEST - 2 VIEW COMPARISON:  10/15/2018 FINDINGS: A dual chamber left-sided pacemaker is noted. The heart size is enlarged. There is no pneumothorax. No large pleural effusion. The lungs are somewhat hyperexpanded. There is slightly prominent interstitial lung markings bilaterally which is stable from prior study. Aortic calcifications are noted. IMPRESSION: No active cardiopulmonary disease. Electronically Signed   By: Katherine Mantlehristopher  Green M.D.   On: 10/21/2018 00:45   [place] Renal  Result Date: 10/21/2018 CLINICAL DATA:  Acute renal insufficiency EXAM: RENAL / URINARY TRACT ULTRASOUND COMPLETE COMPARISON:  None. FINDINGS: Right Kidney: Renal measurements: 11.1 x 4.8 x 5.5 cm = volume: 153 mL . Echogenicity within normal limits. No mass or hydronephrosis visualized. Left Kidney: Renal measurements: 11.6 x 5.5 x 5.3 cm = volume: 170 mL. Contains a 10 mm cyst. Bladder: Appears normal for degree of bladder distention. IMPRESSION: Small 10 mm cysts on the left kidney.  No other abnormalities. Electronically Signed   By: Gerome Samavid  Williams III M.D   On: 10/21/2018 12:41      Impression / Plan:   Mike Townsend is a 73 y.o. present male with history of gastric sleeve in 1975, hypertension, hyperlipidemia, chronic fatigue and some weight loss, A. fib on Eliquis and amiodarone, hypothyroidism  admitted with acute chest pain of unclear etiology.  Cardiology felt his chest pain is probably not cardiac etiology.  Unlikely peptic ulcer disease causing chest pain.  Other less common causes of chest pain include esophageal spasm or esophageal dysmotility.  Patient will be undergoing V/Q scan to evaluate for pulmonary embolism.  I discussed with patient about timing of upper endoscopy which can be  done either as inpatient or outpatient and he prefers to wait until other work-up including VQ scan and cardiac cath are performed.  In the meantime, continue Protonix 40 mg twice daily  Macrocytic anemia and thrombocytopenia Normal ferritin and folate levels Recommend ultrasound abdomen to evaluate liver and spleen Recommend checking B12 levels He may need hematology evaluation as outpatient  Follow-up with GI in 1 to 2 weeks after discharge  Thank you for involving me in the care of this patient.  Will follow along with you    LOS: 1 day   Lannette Donathohini Joan Herschberger, MD  10/22/2018, 12:14 PM   Note: This dictation was prepared with Dragon dictation along with smaller phrase technology. Any transcriptional errors that result from this process are unintentional.

## 2018-10-22 NOTE — Progress Notes (Signed)
ANTICOAGULATION CONSULT NOTE - Initial Consult  Pharmacy Consult for Heparin Drip Indication: chest pain/ACS  No Known Allergies  Patient Measurements: Height: 6\' 2"  (188 cm) Weight: 232 lb 6.4 oz (105.4 kg) IBW/kg (Calculated) : 82.2 Heparin Dosing Weight: 99.8 kg  Vital Signs: Temp: 98.7 F (37.1 C) (06/15 0743) Temp Source: Oral (06/15 0743) BP: 108/61 (06/15 0743) Pulse Rate: 61 (06/15 0743)  Labs: Recent Labs    10/21/18 0008  10/21/18 1049 10/21/18 1644 10/21/18 1853 10/21/18 2251 10/22/18 0456  HGB 12.9*  --   --   --   --   --  11.1*  HCT 38.6*  --   --   --   --   --  32.8*  PLT 144*  --   --   --   --   --  119*  APTT 26  --  75*  --  104*  --  91*  LABPROT 13.0  --   --   --   --   --   --   INR 1.0  --   --   --   --   --   --   HEPARINUNFRC 0.58  --  0.89*  --   --   --  0.56  CREATININE 2.52*  --   --   --  1.79*  --  1.50*  TROPONINI <0.03   < > <0.03 <0.03  --  <0.03  --    < > = values in this interval not displayed.    Estimated Creatinine Clearance: 58.5 mL/min (A) (by C-G formula based on SCr of 1.5 mg/dL (H)).   Medical History: Past Medical History:  Diagnosis Date  . Gout   . Hyperlipidemia   . Hypertension   . Paroxysmal atrial fibrillation [facility])    Assessment: Patient is a 73yo male admitted for chest pain/STEMI. Pharmacy consulted for Heparin drip dosing. Noted that patient taking Apixaban 5mg  BID prior to admission, last dose was taken 6/13 at 05:30. Basleine: aptt 26, HL 0.58 (elevated),   Heparin 4000 unit bolus followed by 1300 units/hr 6/14 @1049  aPTT 75. HL 0.89 6/14 @ 1853 aPTT 104 heparin decreased to 1200 units/hr  6/14 @ 0456 aPTT 91 HL 0.56    Goal of Therapy:  Heparin level 0.3-0.7 units/ml when correlates with aPTT.  aPTT 66-102 seconds Monitor platelets by anticoagulation protocol: Yes   Plan:  Heparin level seems to be correlating with aPTT. Will switch to HL to monitor heparin. HL and aPTT within goal range.  Will continue current rate of 1200 units/hr. Will check HL in 8 hours for confirmation . Daily CBC while on Heparin drip.    Eleonore Chiquito, PharmD, BCPS 10/22/2018 8:16 AM

## 2018-10-23 ENCOUNTER — Inpatient Hospital Stay
Admit: 2018-10-23 | Discharge: 2018-10-23 | Disposition: A | Discharge status: Home / Self Care | Payer: Medicare HMO | Attending: Cardiology | Admitting: Cardiology

## 2018-10-23 DIAGNOSIS — D519 Vitamin B12 deficiency anemia, unspecified: Secondary | ICD-10-CM

## 2018-10-23 DIAGNOSIS — R0781 Pleurodynia: Secondary | ICD-10-CM

## 2018-10-23 LAB — CBC
HCT: 35.2 % — ABNORMAL LOW (ref 39.0–52.0)
Hemoglobin: 11.7 g/dL — ABNORMAL LOW (ref 13.0–17.0)
MCH: 37.1 pg — ABNORMAL HIGH (ref 26.0–34.0)
MCHC: 33.2 g/dL (ref 30.0–36.0)
MCV: 111.7 fL — ABNORMAL HIGH (ref 80.0–100.0)
Platelets: 113 10*3/uL — ABNORMAL LOW (ref 150–400)
RBC: 3.15 MIL/uL — ABNORMAL LOW (ref 4.22–5.81)
RDW: 14.4 % (ref 11.5–15.5)
WBC: 8.9 10*3/uL (ref 4.0–10.5)
nRBC: 0 % (ref 0.0–0.2)

## 2018-10-23 LAB — T4, FREE: Free T4: 1.09 ng/dL (ref 0.61–1.12)

## 2018-10-23 LAB — BASIC METABOLIC PANEL
Anion gap: 7 (ref 5–15)
BUN: 34 mg/dL — ABNORMAL HIGH (ref 8–23)
CO2: 19 mmol/L — ABNORMAL LOW (ref 22–32)
Calcium: 8.1 mg/dL — ABNORMAL LOW (ref 8.9–10.3)
Chloride: 107 mmol/L (ref 98–111)
Creatinine, Ser: 1.31 mg/dL — ABNORMAL HIGH (ref 0.61–1.24)
GFR calc Af Amer: >60 mL/min (ref >60)
GFR calc non Af Amer: 54 mL/min — ABNORMAL LOW (ref >60)
Glucose, Bld: 113 mg/dL — ABNORMAL HIGH (ref 70–99)
Potassium: 4.3 mmol/L (ref 3.5–5.1)
Sodium: 133 mmol/L — ABNORMAL LOW (ref 135–145)

## 2018-10-23 LAB — ECHOCARDIOGRAM COMPLETE
Height: 74 in
Weight: 3840 oz

## 2018-10-23 LAB — H. PYLORI BREATH TEST: H. pylori UBiT: NEGATIVE

## 2018-10-23 MED ORDER — ASPIRIN 81 MG PO CHEW
81.0000 mg | CHEWABLE_TABLET | ORAL | Status: AC
  Administered 2018-10-24: 81 mg via ORAL
  Filled 2018-10-23: qty 1

## 2018-10-23 MED ORDER — SODIUM CHLORIDE 0.9% FLUSH: 3.0000 mL | INTRAVENOUS | Status: DC | PRN

## 2018-10-23 MED ORDER — SODIUM CHLORIDE 0.9% FLUSH
3.0000 mL | Freq: Two times a day (BID) | INTRAVENOUS | Status: DC
  Administered 2018-10-23: 3 mL via INTRAVENOUS

## 2018-10-23 MED ORDER — SODIUM CHLORIDE 0.9 % IV SOLN: 250.0000 mL | INTRAVENOUS | Status: DC | PRN

## 2018-10-23 MED ORDER — SODIUM CHLORIDE 0.9% FLUSH
3.0000 mL | Freq: Two times a day (BID) | INTRAVENOUS | Status: DC
  Administered 2018-10-23 (×2): 3 mL via INTRAVENOUS

## 2018-10-23 NOTE — Progress Notes (Signed)
[facility]  SUBJECTIVE: Patient fully laying in bed, with mild pleuritic chest discomfort   Vitals:   10/22/18 1617 10/22/18 1948 10/23/18 0400 10/23/18 0726  BP: 108/79 106/73 102/73 107/80  Pulse: 67 67 60 (!) 59  Resp: 18 20 20 19   Temp: 98.8 F (37.1 C) 99.1 F (37.3 C) 98 F (36.7 C) 98.8 F (37.1 C)  TempSrc: Oral Oral Oral Oral  SpO2: 97% 95% 95% 97%  Weight:   108.9 kg   Height:         Intake/Output Summary (Last 24 hours) at 10/23/2018 0835 Last data filed at 10/23/2018 0300 Gross per 24 hour  Intake 1198.55 ml  Output 1250 ml  Net -51.45 ml      PHYSICAL EXAM  General: Well developed, well nourished, in no acute distress HEENT:  Normocephalic and atramatic Neck:  No JVD.  Lungs: Clear bilaterally to auscultation and percussion. Heart: HRRR . Normal S1 and S2 without gallops or murmurs.  Abdomen: Bowel sounds are positive, abdomen soft and non-tender  Msk:  Back normal, normal gait. Normal strength and tone for age. Extremities: No clubbing, cyanosis or edema.   Neuro: Alert and oriented X 3. Psych:  Good affect, responds appropriately   LABS: Basic Metabolic Panel: Recent Labs    10/22/18 0456 10/23/18 0415  NA 135 133*  K 4.1 4.3  CL 106 107  CO2 22 19*  GLUCOSE 119* 113*  BUN 40* 34*  CREATININE 1.50* 1.31*  CALCIUM 8.1* 8.1*   Liver Function Tests: Recent Labs    10/22/18 0451  AST 43*  ALT 45*  ALKPHOS 43  BILITOT 1.0  PROT 5.7*  ALBUMIN 3.1*   No results for input(s): LIPASE, AMYLASE in the last 72 hours. CBC: Recent Labs    10/22/18 0456 10/23/18 0415  WBC 10.9* 8.9  HGB 11.1* 11.7*  HCT 32.8* 35.2*  MCV 111.2* 111.7*  PLT 119* 113*   Cardiac Enzymes: Recent Labs    10/21/18 1049 10/21/18 1644 10/21/18 2251  TROPONINI <0.03 <0.03 <0.03   BNP: Invalid input(s): POCBNP D-Dimer: No results for input(s): DDIMER in the last 72 hours. Hemoglobin A1C: Recent Labs    10/21/18 0504  HGBA1C 5.3   Fasting  Lipid Panel: No results for input(s): CHOL, HDL, LDLCALC, TRIG, CHOLHDL, LDLDIRECT in the last 72 hours. Thyroid Function Tests: Recent Labs    10/21/18 0504  TSH 4.060   Anemia Panel: Recent Labs    10/22/18 0451 10/22/18 0456  VITAMINB12  --  191  FOLATE 8.5  --   FERRITIN 272  --   TIBC 191*  --   IRON 13*  --     Dg Chest 2 View  Result Date: 10/22/2018 CLINICAL DATA:  Chest pain EXAM: CHEST - 2 VIEW COMPARISON:  10/21/2018 FINDINGS: There are new, small bilateral pleural effusions and/or atelectasis appreciated on lateral view. No other airspace opacity. Redemonstrated cardiomegaly with left chest multi lead pacer. IMPRESSION: There are new, small bilateral pleural effusions and/or atelectasis appreciated on lateral view. No other airspace opacity. Redemonstrated cardiomegaly with left chest multi lead pacer. Electronically Signed   By: Lauralyn PrimesAlex  Bibbey M.D.   On: 10/22/2018 13:29   [place] Abdomen Complete  Result Date: 10/22/2018 CLINICAL DATA:  Thrombocytopenia. EXAM: ABDOMEN ULTRASOUND COMPLETE COMPARISON:  Ultrasound 10/21/2018. FINDINGS: Gallbladder: No gallstones or wall thickening visualized. No sonographic Murphy sign noted by sonographer. Common bile duct: Diameter: 5.0 mm Liver: Increased echogenicity consistent fatty infiltration or hepatocellular disease. Portal vein  is patent on color Doppler imaging with normal direction of blood flow towards the liver. IVC: No abnormality visualized. Pancreas: Visualized portion unremarkable. Spleen: Size and appearance within normal limits. Multiple small echogenic foci noted most consistent calcified granulomas. Right Kidney: Length: 11.2 cm. Echogenicity within normal limits. No mass or hydronephrosis visualized. Left Kidney: Length: 12.2 cm. Echogenicity within normal limits. No hydronephrosis visualized. 1.2 cm simple cyst. Abdominal aorta: No aneurysm visualized. Other findings: None. IMPRESSION: 1. Increased hepatic echogenicity  consistent fatty infiltration or hepatocellular disease. 2.  No gallstones or biliary distention. 3.  1.2 cm simple cyst left kidney. Electronically Signed   By: Marcello Moores  Register   On: 10/22/2018 15:10   US Renal  Result Date: 10/21/2018 CLINICAL DATA:  Acute renal insufficiency EXAM: RENAL / URINARY TRACT ULTRASOUND COMPLETE COMPARISON:  None. FINDINGS: Right Kidney: Renal measurements: 11.1 x 4.8 x 5.5 cm = volume: 153 mL . Echogenicity within normal limits. No mass or hydronephrosis visualized. Left Kidney: Renal measurements: 11.6 x 5.5 x 5.3 cm = volume: 170 mL. Contains a 10 mm cyst. Bladder: Appears normal for degree of bladder distention. IMPRESSION: Small 10 mm cysts on the left kidney.  No other abnormalities. Electronically Signed   By: Dorise Bullion III M.D   On: 10/21/2018 12:41   Nm Pulmonary Perf And Vent  Result Date: 10/22/2018 CLINICAL DATA:  Chest pain. EXAM: NUCLEAR MEDICINE VENTILATION - PERFUSION LUNG SCAN TECHNIQUE: Ventilation images were obtained in multiple projections using inhaled aerosol Tc-33m DTPA. Perfusion images were obtained in multiple projections after intravenous injection of Tc-57m MAA. RADIOPHARMACEUTICALS:  32.589 mCi of Tc-6m DTPA aerosol inhalation and 4.356 mCi Tc93m MAA IV COMPARISON:  Chest x-ray dated 10/22/2018 FINDINGS: Ventilation: No focal ventilation defect. Perfusion: No wedge shaped peripheral perfusion defects to suggest acute pulmonary embolism. IMPRESSION: Normal ventilation perfusion lung scan. No evidence of pulmonary embolism. Electronically Signed   By: Lorriane Shire M.D.   On: 10/22/2018 14:34     Echo ending  TELEMETRY: Paced rhythm:  ASSESSMENT AND PLAN:  Active Problems:   NSTEMI (non-ST elevated myocardial infarction) ([place])    1.  Pleuritic chest pain, negative troponin, paced rhythm, no pericardial rub, scheduled for coronary angiography in a.m. 2.  Renal failure, with underlying CKD stage III, back to baseline after IV  normal saline infusion  Recommendations  1.  Agree with current therapy 2.  Continue IV normal saline infusion 3.  2D echocardiogram 4.  Cardiac catheterization with selective coronary arteriography scheduled for the a.m.  The risk, benefits and alternatives of cardiac catheterization were explained to the patient and informed written consent was obtained.   Isaias Cowman, MD, PhD, [facility] 10/23/2018 8:35 AM

## 2018-10-23 NOTE — Evaluation (Signed)
Physical Therapy Evaluation Patient Details Name: Mike Townsend MRN: [id number] DOB: Feb 10, 1946 Today's Date: 10/23/2018   History of Present Illness  73 y.o.  male with gout, hyperlipidemia, hypertension, paroxysmal atrial fibrillation, chronic kidney disease stage III, pacemaker, baseline creatinine 1.2, who was admitted to [facility] on 10/20/2018 for evaluation of chest pain. Troponins are negative and VQ scan is negative for pulmonary embolism, GI to follow up in a few weeks.    Clinical Impression  Patient awake, alert, reported independent at baseline, lives with wife in one story home, works part time with real estate. Pt reported mild midsternal pain with deep breaths, RN aware but overall pain in chest much improved and has not had any further episodes.  Upon assessment pt demonstrated transfers independently. Pt reported that he has been having some light headedness at home, but did not indicate any during mobility this session. Pt ambulated ~289ft with and without IV pole. Pt able to ambulate ~22ft without IV support, though preferred to ambulate with it. Decreased gait velocity noted with and without IV pole, wide base of support, fatigue and mild SOB noted. The patient demonstrated near return to baseline level functioning, but would benefit from further skilled PT to address mild limitations in strength, endurance, and activity tolerance to maximize pt's ability to perform functional/recreational activities.    Follow Up Recommendations Outpatient PT    Equipment Recommendations  None recommended by PT;Other (comment)(Pt has cane at home)    Recommendations for Other Services       Precautions / Restrictions Precautions Precautions: ICD/Pacemaker Restrictions Weight Bearing Restrictions: No      Mobility  Bed Mobility Overal bed mobility: Independent                Transfers Overall transfer level: Independent                   Ambulation/Gait Ambulation/Gait assistance: Modified independent (Device/Increase time);Independent Gait Distance (Feet): 200 Feet Assistive device: IV Pole     Gait velocity interpretation: 1.31 - 2.62 ft/sec, indicative of limited community ambulator General Gait Details: Pt able to ambulate ~2ft without IV support, though prefers to ambulate with it. Decreased gait velocity noted with and without IV pole, wide base of support, fatigue and mild SOB noted.  Stairs            Wheelchair Mobility    Modified Rankin (Stroke Patients Only)       Balance Overall balance assessment: Mild deficits observed, not formally tested                                           Pertinent Vitals/Pain Pain Assessment: (some mild midsternal pain with deep breaths, RN aware)    Home Living Family/patient expects to be discharged to:: Private residence Living Arrangements: Spouse/significant other Available Help at Discharge: Family Type of Home: House Home Access: Level entry     Home Layout: One level Home Equipment: Kasandra Knudsen - single point      Prior Function Level of Independence: Independent         Comments: PT works part time for real estate, does not use AD at baseline     [place]: Right    Extremity/Trunk Assessment   Upper Extremity Assessment Upper Extremity Assessment: Overall WFL for tasks assessed    Lower Extremity Assessment Lower Extremity Assessment: Overall  WFL for tasks assessed    Cervical / Trunk Assessment Cervical / Trunk Assessment: Normal  Communication   Communication: No difficulties  Cognition Arousal/Alertness: Awake/alert Behavior During Therapy: WFL for tasks assessed/performed Overall Cognitive Status: Within Functional Limits for tasks assessed                                        General Comments      Exercises     Assessment/Plan    PT Assessment Patient needs  continued PT services  PT Problem List Decreased strength;Decreased activity tolerance;Decreased balance       PT Treatment Interventions DME instruction;Therapeutic exercise;Gait training;Balance training;Stair training;Therapeutic activities;Patient/family education;Neuromuscular re-education    PT Goals (Current goals can be found in the Care Plan section)  Acute Rehab PT Goals Patient Stated Goal: to get back to work PT Goal Formulation: With patient Time For Goal Achievement: 11/06/18 Potential to Achieve Goals: Good    Frequency Min 2X/week   Barriers to discharge        Co-evaluation               AM-PAC PT "6 Clicks" Mobility  Outcome Measure Help needed turning from your back to your side while in a flat bed without using bedrails?: None Help needed moving from lying on your back to sitting on the side of a flat bed without using bedrails?: None Help needed moving to and from a bed to a chair (including a wheelchair)?: None Help needed standing up from a chair using your arms (e.g., wheelchair or bedside chair)?: None Help needed to walk in hospital room?: None Help needed climbing 3-5 steps with a railing? : A Little 6 Click Score: 23    End of Session Equipment Utilized During Treatment: Gait belt Activity Tolerance: Patient tolerated treatment well Patient left: in chair;Other (comment)(no chair alarm box in room, RN notified) Nurse Communication: Mobility status PT Visit Diagnosis: Other abnormalities of gait and mobility (R26.89)    Time: [id number] PT Time Calculation (min) (ACUTE ONLY): 21 min   Charges:   PT Evaluation $PT Eval Low Complexity: 1 Low          Olga Coasteriana Haedyn Ancrum PT, DPT 10:44 AM,10/23/18 [phone]

## 2018-10-23 NOTE — Progress Notes (Signed)
[facility]  ROUNDING NOTE   Subjective:   Denies having any chest pain.  NS at 12625mL/hr  Objective:  Vital signs in last 24 hours:  Temp:  [98 F (36.7 C)-99.1 F (37.3 C)] 98.8 F (37.1 C) (06/16 0726) Pulse Rate:  [59-67] 59 (06/16 0726) Resp:  [18-20] 19 (06/16 0726) BP: (102-108)/(73-80) 107/80 (06/16 0726) SpO2:  [95 %-97 %] 97 % (06/16 0726) Weight:  [108.9 kg] 108.9 kg (06/16 0400)  Weight change: 3.447 kg Filed Weights   10/21/18 0443 10/22/18 0355 10/23/18 0400  Weight: 100.6 kg 105.4 kg 108.9 kg    Intake/Output: I/O last 3 completed shifts: In: 2818.8 [P.O.:240; I.V.:2578.8] Out: 1825 [Urine:1825]   Intake/Output this shift:  Total I/O In: 240 [P.O.:240] Out: -   Physical Exam: General: NAD, sitting up in bed  Head: Normocephalic, atraumatic. Moist oral mucosal membranes  Eyes: Anicteric, PERRL  Neck: Supple, trachea midline  Lungs:  Clear to auscultation  Heart: Regular rate and rhythm  Abdomen:  Soft, nontender,   Extremities: no peripheral edema.  Neurologic: Nonfocal, moving all four extremities  Skin: No lesions        Basic Metabolic Panel: Recent Labs  Lab 10/21/18 0008 10/21/18 1853 10/22/18 0456 10/23/18 0415  NA 140 135 135 133*  K 4.3 4.3 4.1 4.3  CL 105 103 106 107  CO2 24 22 22  19*  GLUCOSE 108* 126* 119* 113*  BUN 49* 41* 40* 34*  CREATININE 2.52* 1.79* 1.50* 1.31*  CALCIUM 9.0 8.3* 8.1* 8.1*    Liver Function Tests: Recent Labs  Lab 10/22/18 0451  AST 43*  ALT 45*  ALKPHOS 43  BILITOT 1.0  PROT 5.7*  ALBUMIN 3.1*   No results for input(s): LIPASE, AMYLASE in the last 168 hours. No results for input(s): AMMONIA in the last 168 hours.  CBC: Recent Labs  Lab 10/21/18 0008 10/22/18 0456 10/23/18 0415  WBC 11.2* 10.9* 8.9  HGB 12.9* 11.1* 11.7*  HCT 38.6* 32.8* 35.2*  MCV 113.9* 111.2* 111.7*  PLT 144* 119* 113*    Cardiac Enzymes: Recent Labs  Lab 10/21/18 0008 10/21/18 0504  10/21/18 1049 10/21/18 1644 10/21/18 2251  TROPONINI <0.03 <0.03 <0.03 <0.03 <0.03    BNP: Invalid input(s): POCBNP  CBG: No results for input(s): GLUCAP in the last 168 hours.  Microbiology: Results for orders placed or performed during the hospital encounter of 10/19/18  Novel Coronavirus, NAA (hospital order; send-out to ref lab)     Status: None   Collection Time: 10/19/18 11:57 AM   Specimen: Nasopharyngeal Swab; Respiratory  Result Value Ref Range Status   SARS-CoV-2, NAA NOT DETECTED NOT DETECTED Final    Comment: (NOTE) This test was developed and its performance characteristics determined by [facility]. This test has not been FDA cleared or approved. This test has been authorized by FDA under an Emergency Use Authorization (EUA). This test is only authorized for the duration of time the declaration that circumstances exist justifying the authorization of the emergency use of in vitro diagnostic tests for detection of SARS-CoV-2 virus and/or diagnosis of COVID-19 infection under section 564(b)(1) of the Act, 21 U.S.C. 161WRU-0(A)(5360bbb-3(b)(1), unless the authorization is terminated or revoked sooner. When diagnostic testing is negative, the possibility of a false negative result should be considered in the context of a patient's recent exposures and the presence of clinical signs and symptoms consistent with COVID-19. An individual without symptoms of COVID-19 and who is not shedding SARS-CoV-2 virus would expect to  have a negative (not detected) result in this assay. Performed  At: [facility] [place], [place] [id number] Rush Farmer MD DJ:[id number]    [place]  Final    Comment: Performed at [facility], [address] [place], [place] [address]    Coagulation Studies: Recent Labs    10/21/18 0008  LABPROT 13.0  INR 1.0    Urinalysis: No results for input(s): COLORURINE, LABSPEC, PHURINE,  GLUCOSEU, HGBUR, BILIRUBINUR, KETONESUR, PROTEINUR, UROBILINOGEN, NITRITE, LEUKOCYTESUR in the last 72 hours.  Invalid input(s): APPERANCEUR    Imaging: Dg Chest 2 View  Result Date: 10/22/2018 CLINICAL DATA:  Chest pain EXAM: CHEST - 2 VIEW COMPARISON:  10/21/2018 FINDINGS: There are new, small bilateral pleural effusions and/or atelectasis appreciated on lateral view. No other airspace opacity. Redemonstrated cardiomegaly with left chest multi lead pacer. IMPRESSION: There are new, small bilateral pleural effusions and/or atelectasis appreciated on lateral view. No other airspace opacity. Redemonstrated cardiomegaly with left chest multi lead pacer. Electronically Signed   By: Eddie Candle M.D.   On: 10/22/2018 13:29   US Abdomen Complete  Result Date: 10/22/2018 CLINICAL DATA:  Thrombocytopenia. EXAM: ABDOMEN ULTRASOUND COMPLETE COMPARISON:  Ultrasound 10/21/2018. FINDINGS: Gallbladder: No gallstones or wall thickening visualized. No sonographic Murphy sign noted by sonographer. Common bile duct: Diameter: 5.0 mm Liver: Increased echogenicity consistent fatty infiltration or hepatocellular disease. Portal vein is patent on color Doppler imaging with normal direction of blood flow towards the liver. IVC: No abnormality visualized. Pancreas: Visualized portion unremarkable. Spleen: Size and appearance within normal limits. Multiple small echogenic foci noted most consistent calcified granulomas. Right Kidney: Length: 11.2 cm. Echogenicity within normal limits. No mass or hydronephrosis visualized. Left Kidney: Length: 12.2 cm. Echogenicity within normal limits. No hydronephrosis visualized. 1.2 cm simple cyst. Abdominal aorta: No aneurysm visualized. Other findings: None. IMPRESSION: 1. Increased hepatic echogenicity consistent fatty infiltration or hepatocellular disease. 2.  No gallstones or biliary distention. 3.  1.2 cm simple cyst left kidney. Electronically Signed   By: Marcello Moores  Register   On:  10/22/2018 15:10   US Renal  Result Date: 10/21/2018 CLINICAL DATA:  Acute renal insufficiency EXAM: RENAL / URINARY TRACT ULTRASOUND COMPLETE COMPARISON:  None. FINDINGS: Right Kidney: Renal measurements: 11.1 x 4.8 x 5.5 cm = volume: 153 mL . Echogenicity within normal limits. No mass or hydronephrosis visualized. Left Kidney: Renal measurements: 11.6 x 5.5 x 5.3 cm = volume: 170 mL. Contains a 10 mm cyst. Bladder: Appears normal for degree of bladder distention. IMPRESSION: Small 10 mm cysts on the left kidney.  No other abnormalities. Electronically Signed   By: Dorise Bullion III M.D   On: 10/21/2018 12:41   Nm Pulmonary Perf And Vent  Result Date: 10/22/2018 CLINICAL DATA:  Chest pain. EXAM: NUCLEAR MEDICINE VENTILATION - PERFUSION LUNG SCAN TECHNIQUE: Ventilation images were obtained in multiple projections using inhaled aerosol Tc-80m DTPA. Perfusion images were obtained in multiple projections after intravenous injection of Tc-75m MAA. RADIOPHARMACEUTICALS:  32.589 mCi of Tc-72m DTPA aerosol inhalation and 4.356 mCi Tc60m MAA IV COMPARISON:  Chest x-ray dated 10/22/2018 FINDINGS: Ventilation: No focal ventilation defect. Perfusion: No wedge shaped peripheral perfusion defects to suggest acute pulmonary embolism. IMPRESSION: Normal ventilation perfusion lung scan. No evidence of pulmonary embolism. Electronically Signed   By: Lorriane Shire M.D.   On: 10/22/2018 14:34     Medications:   . sodium chloride 125 mL/hr at 10/23/18 0646  . sodium chloride     . amiodarone  100 mg Oral Daily  . amLODipine  5 mg Oral QPM  . cyanocobalamin  1,000 mcg Subcutaneous Daily  . docusate sodium  100 mg Oral BID  . lidocaine  1 patch Transdermal Q24H  . magnesium oxide  400 mg Oral Daily  . pantoprazole  40 mg Oral BID  . simvastatin  20 mg Oral QHS  . sodium chloride flush  3 mL Intravenous Q12H  . sodium chloride flush  3 mL Intravenous Q12H   sodium chloride, acetaminophen **OR**  acetaminophen, cyclobenzaprine, morphine injection, nitroGLYCERIN, ondansetron **OR** ondansetron (ZOFRAN) IV, sodium chloride flush  Assessment/ Plan:  Mr. Mike Townsend is a 73 y.o.  male with gout, hyperlipidemia, hypertension, paroxysmal atrial fibrillation, chronic kidney disease stage III baseline creatinine 1.2, who was admitted to [facility] on 10/20/2018 for evaluation of chest pain.   1.  Acute renal failure on chronic kidney disease stage III.  Baseline creatinine 1.5, GFR of 46.  Chronic kidney disease secondary to hypertension.  - IV normal saline infusion before and after cardiac catheterization  2.   Hypertension.  - amlodipine. Discussed switching to an ACE-I/ARB when outpatient.   3. Anemia with chronic kidney disease: macrocytic. With thrombocytopenia - GI input appreciated.    LOS: 2 Gailene Youkhana 6/16/202011:44 AM

## 2018-10-23 NOTE — Progress Notes (Addendum)
Mike Repressohini R Vanga, MD [address]  [address]  [place], [place] [address]  Main: [phone]  Fax: [phone] Pager: [phone]   Subjective: Feeling well, ready to take shower, denies any complaints other than mild pleuritic chest pain   Objective: Vital signs in last 24 hours: Vitals:   10/22/18 1617 10/22/18 1948 10/23/18 0400 10/23/18 0726  BP: 108/79 106/73 102/73 107/80  Pulse: 67 67 60 (!) 59  Resp: 18 20 20 19   Temp: 98.8 F (37.1 C) 99.1 F (37.3 C) 98 F (36.7 C) 98.8 F (37.1 C)  TempSrc: Oral Oral Oral Oral  SpO2: 97% 95% 95% 97%  Weight:   108.9 kg   Height:       Weight change: 3.447 kg  Intake/Output Summary (Last 24 hours) at 10/23/2018 1337 Last data filed at 10/23/2018 0930 Gross per 24 hour  Intake 890.53 ml  Output 1050 ml  Net -159.47 ml     Exam: Heart:: Regular rate and rhythm or S1S2 present Lungs: normal and clear to auscultation Abdomen: soft, nontender, normal bowel sounds   Lab Results: CBC Latest Ref Rng & Units 10/23/2018 10/22/2018 10/21/2018  WBC 4.0 - 10.5 K/uL 8.9 10.9(H) 11.2(H)  Hemoglobin 13.0 - 17.0 g/dL 11.7(L) 11.1(L) 12.9(L)  Hematocrit 39.0 - 52.0 % 35.2(L) 32.8(L) 38.6(L)  Platelets 150 - 400 K/uL 113(L) 119(L) 144(L)   CMP Latest Ref Rng & Units 10/23/2018 10/22/2018 10/21/2018  Glucose 70 - 99 mg/dL 324(M113(H) 010(U119(H) 725(D126(H)  BUN 8 - 23 mg/dL 66(Y34(H) 40(H40(H) 47(Q41(H)  Creatinine 0.61 - 1.24 mg/dL 2.59(D1.31(H) 6.38(V1.50(H) 5.64(P1.79(H)  Sodium 135 - 145 mmol/L 133(L) 135 135  Potassium 3.5 - 5.1 mmol/L 4.3 4.1 4.3  Chloride 98 - 111 mmol/L 107 106 103  CO2 22 - 32 mmol/L 19(L) 22 22  Calcium 8.9 - 10.3 mg/dL 8.1(L) 8.1(L) 8.3(L)  Total Protein 6.5 - 8.1 g/dL - 5.7(L) -  Total Bilirubin 0.3 - 1.2 mg/dL - 1.0 -  Alkaline Phos 38 - 126 U/L - 43 -  AST 15 - 41 U/L - 43(H) -  ALT 0 - 44 U/L - 45(H) -    Micro Results: Recent Results (from the past 240 hour(s))  Novel Coronavirus, NAA (hospital order; send-out to ref lab)      Status: None   Collection Time: 10/19/18 11:57 AM   Specimen: Nasopharyngeal Swab; Respiratory  Result Value Ref Range Status   SARS-CoV-2, NAA NOT DETECTED NOT DETECTED Final    Comment: (NOTE) This test was developed and its performance characteristics determined by [facility]. This test has not been FDA cleared or approved. This test has been authorized by FDA under an Emergency Use Authorization (EUA). This test is only authorized for the duration of time the declaration that circumstances exist justifying the authorization of the emergency use of in vitro diagnostic tests for detection of SARS-CoV-2 virus and/or diagnosis of COVID-19 infection under section 564(b)(1) of the Act, 21 U.S.C. 329JJO-8(C)(1360bbb-3(b)(1), unless the authorization is terminated or revoked sooner. When diagnostic testing is negative, the possibility of a false negative result should be considered in the context of a patient's recent exposures and the presence of clinical signs and symptoms consistent with COVID-19. An individual without symptoms of COVID-19 and who is not shedding SARS-CoV-2 virus would expect to have a negative (not detected) result in this assay. Performed  At: [facility] [address] [place], [place] [id number] Jolene SchimkeNagendra Sanjai MD FU:[id number]:[phone]    Coronavirus Source NASOPHARYNGEAL  Final  Comment: Performed at [facility], [address] [place], [place] [address]   Studies/Results: Dg Chest 2 View  Result Date: 10/22/2018 CLINICAL DATA:  Chest pain EXAM: CHEST - 2 VIEW COMPARISON:  10/21/2018 FINDINGS: There are new, small bilateral pleural effusions and/or atelectasis appreciated on lateral view. No other airspace opacity. Redemonstrated cardiomegaly with left chest multi lead pacer. IMPRESSION: There are new, small bilateral pleural effusions and/or atelectasis appreciated on lateral view. No other airspace opacity. Redemonstrated cardiomegaly with left chest  multi lead pacer. Electronically Signed   By: Eddie Candle M.D.   On: 10/22/2018 13:29   US Abdomen Complete  Result Date: 10/22/2018 CLINICAL DATA:  Thrombocytopenia. EXAM: ABDOMEN ULTRASOUND COMPLETE COMPARISON:  Ultrasound 10/21/2018. FINDINGS: Gallbladder: No gallstones or wall thickening visualized. No sonographic Murphy sign noted by sonographer. Common bile duct: Diameter: 5.0 mm Liver: Increased echogenicity consistent fatty infiltration or hepatocellular disease. Portal vein is patent on color Doppler imaging with normal direction of blood flow towards the liver. IVC: No abnormality visualized. Pancreas: Visualized portion unremarkable. Spleen: Size and appearance within normal limits. Multiple small echogenic foci noted most consistent calcified granulomas. Right Kidney: Length: 11.2 cm. Echogenicity within normal limits. No mass or hydronephrosis visualized. Left Kidney: Length: 12.2 cm. Echogenicity within normal limits. No hydronephrosis visualized. 1.2 cm simple cyst. Abdominal aorta: No aneurysm visualized. Other findings: None. IMPRESSION: 1. Increased hepatic echogenicity consistent fatty infiltration or hepatocellular disease. 2.  No gallstones or biliary distention. 3.  1.2 cm simple cyst left kidney. Electronically Signed   By: Marcello Moores  Register   On: 10/22/2018 15:10   Nm Pulmonary Perf And Vent  Result Date: 10/22/2018 CLINICAL DATA:  Chest pain. EXAM: NUCLEAR MEDICINE VENTILATION - PERFUSION LUNG SCAN TECHNIQUE: Ventilation images were obtained in multiple projections using inhaled aerosol Tc-5m DTPA. Perfusion images were obtained in multiple projections after intravenous injection of Tc-12m MAA. RADIOPHARMACEUTICALS:  32.589 mCi of Tc-29m DTPA aerosol inhalation and 4.356 mCi Tc64m MAA IV COMPARISON:  Chest x-ray dated 10/22/2018 FINDINGS: Ventilation: No focal ventilation defect. Perfusion: No wedge shaped peripheral perfusion defects to suggest acute pulmonary embolism. IMPRESSION:  Normal ventilation perfusion lung scan. No evidence of pulmonary embolism. Electronically Signed   By: Lorriane Shire M.D.   On: 10/22/2018 14:34   Medications:  I have reviewed the patient's current medications. Prior to Admission:  Medications Prior to Admission  Medication Sig Dispense Refill Last Dose  . allopurinol (ZYLOPRIM) 300 MG tablet Take 300 mg by mouth daily.   10/20/2018 at Unknown time  . amiodarone (PACERONE) 200 MG tablet Take 100 mg by mouth daily.    10/20/2018 at Unknown time  . amLODipine (NORVASC) 5 MG tablet Take 5 mg by mouth every evening.    Past Week at Unknown time  . ELIQUIS 5 MG TABS tablet Take 5 mg by mouth 2 (two) times a day.   10/20/2018 at 0530  . magnesium oxide (MAG-OX) 400 MG tablet Take 400 mg by mouth daily.   10/20/2018 at Unknown time  . Saw Palmetto, Serenoa repens, (SAW PALMETTO PO) Take 1 capsule by mouth daily.   10/20/2018 at Unknown time  . simvastatin (ZOCOR) 20 MG tablet Take 20 mg by mouth at bedtime.   Past Week at Unknown time   Scheduled: . amiodarone  100 mg Oral Daily  . amLODipine  5 mg Oral QPM  . cyanocobalamin  1,000 mcg Subcutaneous Daily  . docusate sodium  100 mg Oral BID  . lidocaine  1 patch  Transdermal Q24H  . magnesium oxide  400 mg Oral Daily  . pantoprazole  40 mg Oral BID  . simvastatin  20 mg Oral QHS  . sodium chloride flush  3 mL Intravenous Q12H  . sodium chloride flush  3 mL Intravenous Q12H   Continuous: . sodium chloride 125 mL/hr at 10/23/18 0646  . sodium chloride     Scheduled Meds: . amiodarone  100 mg Oral Daily  . amLODipine  5 mg Oral QPM  . cyanocobalamin  1,000 mcg Subcutaneous Daily  . docusate sodium  100 mg Oral BID  . lidocaine  1 patch Transdermal Q24H  . magnesium oxide  400 mg Oral Daily  . pantoprazole  40 mg Oral BID  . simvastatin  20 mg Oral QHS  . sodium chloride flush  3 mL Intravenous Q12H  . sodium chloride flush  3 mL Intravenous Q12H   Continuous Infusions: . sodium chloride  125 mL/hr at 10/23/18 0646  . sodium chloride     PRN Meds:.sodium chloride, acetaminophen **OR** acetaminophen, cyclobenzaprine, morphine injection, nitroGLYCERIN, ondansetron **OR** ondansetron (ZOFRAN) IV, sodium chloride flush   Assessment: Active Problems:   NSTEMI (non-ST elevated myocardial infarction) (HCC)  Pleuritic chest pain, new onset of macrocytic anemia and thrombocytopenia B12 deficiency Ultrasound abdomen revealed fatty liver only  Plan: Continue vitamin B12 1000 MCG subcutaneous Check anti-intrinsic factor and antiparietal cell antibodies, check homocystine and methylmalonic acid levels EGD as outpatient after cardiac cath which is scheduled for tomorrow Continue Protonix 40 mg twice daily upon discharge Thrombocytopenia could be related to B12 deficiency However, recommend hematology consultation as outpatient  ultrasound abdomen revealed normal spleen and no evidence of cirrhosis, other than fatty liver Check viral hepatitis panel Follow-up with GI as outpatient in 2 weeks after discharge  We will sign off at this time, please call us back with any questions or concerns   LOS: 2 days   Rohini Townsend 10/23/2018, 1:37 PM

## 2018-10-23 NOTE — Progress Notes (Signed)
[facility] [place] [facility]   PATIENT NAME: Mike Townsend    MR#:  [id number]  DATE OF BIRTH:  1945/11/08  SUBJECTIVE:  CHIEF COMPLAINT: Patient is feeling much better.  Chest pain is improving renal function is improving scheduled for cardiac cath in a.m.  REVIEW OF SYSTEMS:  CONSTITUTIONAL: No fever, fatigue or weakness.  EYES: No blurred or double vision.  EARS, NOSE, AND THROAT: No tinnitus or ear pain.  RESPIRATORY: No cough, shortness of breath, wheezing or hemoptysis.  CARDIOVASCULAR: Anterior chest wall  pain is improving, denies orthopnea, edema.  GASTROINTESTINAL: No nausea, vomiting, diarrhea or abdominal pain.  GENITOURINARY: No dysuria, hematuria.  ENDOCRINE: No polyuria, nocturia,  HEMATOLOGY: No anemia, easy bruising or bleeding SKIN: No rash or lesion. MUSCULOSKELETAL: No joint pain or arthritis.   NEUROLOGIC: No tingling, numbness, weakness.  PSYCHIATRY: No anxiety or depression.   DRUG ALLERGIES:  No Known Allergies  VITALS:  Blood pressure 128/84, pulse 67, temperature 97.8 F (36.6 C), temperature source Oral, resp. rate 18, height 6\' 2"  (1.88 m), weight 108.9 kg, SpO2 99 %.  PHYSICAL EXAMINATION:  GENERAL:  73 y.o.-year-old patient lying in the bed with no acute distress.  EYES: Pupils equal, round, reactive to light and accommodation. No scleral icterus. Extraocular muscles intact.  HEENT: Head atraumatic, normocephalic. Oropharynx and nasopharynx clear.  NECK:  Supple, no jugular venous distention. No thyroid enlargement, no tenderness.  LUNGS: Normal breath sounds bilaterally, no wheezing, rales,rhonchi or crepitation. No use of accessory muscles of respiration.  CARDIOVASCULAR: S1, S2 normal. No murmurs, rubs, or gallops.  Anterior chest wall tenderness on palpation is minimal ABDOMEN: Soft, nontender, nondistended. Bowel sounds present.  EXTREMITIES: No pedal edema, cyanosis, or clubbing.  NEUROLOGIC: Cranial nerves  II through XII are intact. Muscle strength at his baseline in all extremities. Sensation intact. Gait not checked.  PSYCHIATRIC: The patient is alert and oriented x 3.  SKIN: No obvious rash, lesion, or ulcer.    LABORATORY PANEL:   CBC Recent Labs  Lab 10/23/18 0415  WBC 8.9  HGB 11.7*  HCT 35.2*  PLT 113*   ------------------------------------------------------------------------------------------------------------------  Chemistries  Recent Labs  Lab 10/22/18 0451  10/23/18 0415  NA  --    < > 133*  K  --    < > 4.3  CL  --    < > 107  CO2  --    < > 19*  GLUCOSE  --    < > 113*  BUN  --    < > 34*  CREATININE  --    < > 1.31*  CALCIUM  --    < > 8.1*  AST 43*  --   --   ALT 45*  --   --   ALKPHOS 43  --   --   BILITOT 1.0  --   --    < > = values in this interval not displayed.   ------------------------------------------------------------------------------------------------------------------  Cardiac Enzymes Recent Labs  Lab 10/21/18 2251  TROPONINI <0.03   ------------------------------------------------------------------------------------------------------------------  RADIOLOGY:  Dg Chest 2 View  Result Date: 10/22/2018 CLINICAL DATA:  Chest pain EXAM: CHEST - 2 VIEW COMPARISON:  10/21/2018 FINDINGS: There are new, small bilateral pleural effusions and/or atelectasis appreciated on lateral view. No other airspace opacity. Redemonstrated cardiomegaly with left chest multi lead pacer. IMPRESSION: There are new, small bilateral pleural effusions and/or atelectasis appreciated on lateral view. No other airspace opacity. Redemonstrated cardiomegaly with left chest multi lead  pacer. Electronically Signed   By: Lauralyn PrimesAlex  Bibbey M.D.   On: 10/22/2018 13:29   [place] Abdomen Complete  Result Date: 10/22/2018 CLINICAL DATA:  Thrombocytopenia. EXAM: ABDOMEN ULTRASOUND COMPLETE COMPARISON:  Ultrasound 10/21/2018. FINDINGS: Gallbladder: No gallstones or wall thickening  visualized. No sonographic Murphy sign noted by sonographer. Common bile duct: Diameter: 5.0 mm Liver: Increased echogenicity consistent fatty infiltration or hepatocellular disease. Portal vein is patent on color Doppler imaging with normal direction of blood flow towards the liver. IVC: No abnormality visualized. Pancreas: Visualized portion unremarkable. Spleen: Size and appearance within normal limits. Multiple small echogenic foci noted most consistent calcified granulomas. Right Kidney: Length: 11.2 cm. Echogenicity within normal limits. No mass or hydronephrosis visualized. Left Kidney: Length: 12.2 cm. Echogenicity within normal limits. No hydronephrosis visualized. 1.2 cm simple cyst. Abdominal aorta: No aneurysm visualized. Other findings: None. IMPRESSION: 1. Increased hepatic echogenicity consistent fatty infiltration or hepatocellular disease. 2.  No gallstones or biliary distention. 3.  1.2 cm simple cyst left kidney. Electronically Signed   By: Maisie Fushomas  Register   On: 10/22/2018 15:10   Nm Pulmonary Perf And Vent  Result Date: 10/22/2018 CLINICAL DATA:  Chest pain. EXAM: NUCLEAR MEDICINE VENTILATION - PERFUSION LUNG SCAN TECHNIQUE: Ventilation images were obtained in multiple projections using inhaled aerosol Tc-454m DTPA. Perfusion images were obtained in multiple projections after intravenous injection of Tc-3554m MAA. RADIOPHARMACEUTICALS:  32.589 mCi of Tc-1854m DTPA aerosol inhalation and 4.356 mCi Tc4954m MAA IV COMPARISON:  Chest x-ray dated 10/22/2018 FINDINGS: Ventilation: No focal ventilation defect. Perfusion: No wedge shaped peripheral perfusion defects to suggest acute pulmonary embolism. IMPRESSION: Normal ventilation perfusion lung scan. No evidence of pulmonary embolism. Electronically Signed   By: Francene BoyersJames  Maxwell M.D.   On: 10/22/2018 14:34    EKG:   Orders placed or performed during the hospital encounter of 10/20/18  . ED EKG  . ED EKG    ASSESSMENT AND PLAN:   This is a  73 year old male admitted for chest pain #.  Chest pain- angina versus pleuritic chest pain versus musculoskeletal pain versus GERD Monitor patient on telemetry and troponins are negative so far-less than 0.033.  Discussed with cardiology Dr. Juliann Parescallwood  not recommending any interventions .  Patient is actually scheduled for cardiac cath on Wednesday  Paced rhythm therefore EKG difficult to determine ischemic changes.  Patient's chest pain significantly improved.  Troponins are negative and VQ scan is negative for pulmonary embolism discontinued heparin drip  Muscle relaxant as needed  #GERD with history of gastric erosions and GI bleed in 2012 Protonix, GI consult discussed with Dr. Allegra LaiVanga- unlikely  Recommending to continue Protonix 40 mg twice a day Outpatient follow-up with gastroenterology in 1 to 2 weeks after discharge  #Macrocytic anemia and thrombocytopenia Normal ferritin and folate levels ultrasound of the abdomen to evaluate liver and spleen-reveals fatty liver no gallstones B12 subcutaneously Outpatient hematology follow-up is recommended GI recommending viral hepatitis panel check, outpatient EGD   #.  Acute kidney injury on chronic kidney disease- IV fluids, avoid nephrotoxins and discontinued allopurinol Renal ultrasound-no acute findings We will monitor intake and output. Foley catheter for close monitoring of the output Will consider renal consult if no improvement Nephrology is following Creatinine is improving 2.5-1.5-131   # Hypertension: Controlled; continue amlodipine  #   Atrial fibrillation: Rate controlled; continue amiodarone.  Resume Eliquis following PCI.  Patient scheduled for cardiac cath on Wednesday even prior to this admission  #.  Gout: Controlled; holding allopurinol  #Tobacco abuse disorder  counseled patient to quit using pipes.  He is agreeable.  Will think about nicotine patch  .  DVT prophylaxis: Therapeutic anticoagulation  .  GI  prophylaxis: PPI     All the records are reviewed and case discussed with Care Management/Social Workerr. Management plans discussed with the patient, daughter Ria Comment over phone [phone] and they are in agreement.  CODE STATUS: fc   TOTAL TIME TAKING CARE OF THIS PATIENT: 39  minutes.   POSSIBLE D/C IN 1-2 DAYS, DEPENDING ON CLINICAL CONDITION.  Note: This dictation was prepared with Dragon dictation along with smaller phrase technology. Any transcriptional errors that result from this process are unintentional.   Nicholes Mango M.D on 10/23/2018 at 4:59 PM  Between 7am to 6pm - Pager - [phone] After 6pm go to [email or web address] - password [email or web address] [place] [facility]  Office  [phone]  CC: Primary care physician; Rusty Aus, MD

## 2018-10-23 NOTE — Progress Notes (Signed)
*  PRELIMINARY RESULTS* Echocardiogram 2D Echocardiogram has been performed.  Mike Townsend 10/23/2018, 1:44 PM

## 2018-10-24 ENCOUNTER — Encounter
Admission: EM | Disposition: A | Discharge status: Home / Self Care | Payer: Self-pay | Source: Home / Self Care | Attending: Internal Medicine

## 2018-10-24 ENCOUNTER — Ambulatory Visit: Admission: RE | Admit: 2018-10-24 | Payer: Medicare HMO | Source: Home / Self Care | Admitting: Cardiology

## 2018-10-24 ENCOUNTER — Encounter: Payer: Self-pay | Admitting: Cardiology

## 2018-10-24 HISTORY — PX: LEFT HEART CATH AND CORONARY ANGIOGRAPHY: CATH118249

## 2018-10-24 LAB — HEPATITIS PANEL, ACUTE
HCV Ab: <0.1 s/co ratio (ref 0.0–0.9)
Hep A IgM: NEGATIVE
Hep B C IgM: NEGATIVE
Hepatitis B Surface Ag: NEGATIVE

## 2018-10-24 LAB — HOMOCYSTEINE: Homocysteine: 12.5 umol/L (ref 0.0–19.2)

## 2018-10-24 LAB — INTRINSIC FACTOR ANTIBODIES: Intrinsic Factor: 15.3 AU/mL — ABNORMAL HIGH (ref 0.0–1.1)

## 2018-10-24 SURGERY — LEFT HEART CATH
Anesthesia: Moderate Sedation

## 2018-10-24 SURGERY — LEFT HEART CATH AND CORONARY ANGIOGRAPHY
Anesthesia: Moderate Sedation

## 2018-10-24 SURGERY — LEFT HEART CATH AND CORONARY ANGIOGRAPHY
Anesthesia: Moderate Sedation | Laterality: Left

## 2018-10-24 MED ORDER — VERAPAMIL HCL 2.5 MG/ML IV SOLN
INTRAVENOUS | Status: AC
  Filled 2018-10-24: qty 2

## 2018-10-24 MED ORDER — HEPARIN SODIUM (PORCINE) 1000 UNIT/ML IJ SOLN
INTRAMUSCULAR | Status: DC | PRN
  Administered 2018-10-24: 5000 [IU] via INTRAVENOUS

## 2018-10-24 MED ORDER — HEPARIN SODIUM (PORCINE) 1000 UNIT/ML IJ SOLN
INTRAMUSCULAR | Status: AC
  Filled 2018-10-24: qty 1

## 2018-10-24 MED ORDER — FENTANYL CITRATE (PF) 100 MCG/2ML IJ SOLN
INTRAMUSCULAR | Status: AC
  Filled 2018-10-24: qty 2

## 2018-10-24 MED ORDER — SODIUM CHLORIDE 0.9 % WEIGHT BASED INFUSION
1.0000 mL/kg/h | INTRAVENOUS | Status: AC
  Administered 2018-10-24: 1 mL/kg/h via INTRAVENOUS

## 2018-10-24 MED ORDER — MIDAZOLAM HCL 2 MG/2ML IJ SOLN
INTRAMUSCULAR | Status: DC | PRN
  Administered 2018-10-24: 1 mg via INTRAVENOUS

## 2018-10-24 MED ORDER — METOPROLOL SUCCINATE ER 50 MG PO TB24
50.0000 mg | ORAL_TABLET | Freq: Every day | ORAL | Status: DC
  Administered 2018-10-24 – 2018-10-25 (×2): 50 mg via ORAL
  Filled 2018-10-24 (×2): qty 1

## 2018-10-24 MED ORDER — SODIUM CHLORIDE 0.9% FLUSH: 3.0000 mL | INTRAVENOUS | Status: DC | PRN

## 2018-10-24 MED ORDER — MIDAZOLAM HCL 2 MG/2ML IJ SOLN
INTRAMUSCULAR | Status: AC
  Filled 2018-10-24: qty 2

## 2018-10-24 MED ORDER — FENTANYL CITRATE (PF) 100 MCG/2ML IJ SOLN
INTRAMUSCULAR | Status: DC | PRN
  Administered 2018-10-24: 25 ug via INTRAVENOUS

## 2018-10-24 MED ORDER — LABETALOL HCL 5 MG/ML IV SOLN: 10.0000 mg | INTRAVENOUS | Status: AC | PRN

## 2018-10-24 MED ORDER — ASPIRIN EC 325 MG PO TBEC
325.0000 mg | DELAYED_RELEASE_TABLET | Freq: Every day | ORAL | Status: DC
  Administered 2018-10-25: 325 mg via ORAL
  Filled 2018-10-24: qty 1

## 2018-10-24 MED ORDER — ISOSORBIDE MONONITRATE ER 30 MG PO TB24
30.0000 mg | ORAL_TABLET | Freq: Every day | ORAL | Status: DC
  Administered 2018-10-24 – 2018-10-25 (×2): 30 mg via ORAL
  Filled 2018-10-24 (×2): qty 1

## 2018-10-24 MED ORDER — VERAPAMIL HCL 2.5 MG/ML IV SOLN
INTRAVENOUS | Status: DC | PRN
  Administered 2018-10-24: 2.5 mg via INTRA_ARTERIAL

## 2018-10-24 MED ORDER — IOHEXOL 300 MG/ML  SOLN
INTRAMUSCULAR | Status: DC | PRN
  Administered 2018-10-24: 115 mL via INTRA_ARTERIAL

## 2018-10-24 MED ORDER — ONDANSETRON HCL 4 MG/2ML IJ SOLN: 4.0000 mg | Freq: Four times a day (QID) | INTRAMUSCULAR | Status: DC | PRN

## 2018-10-24 MED ORDER — SODIUM CHLORIDE 0.9 % IV SOLN: 250.0000 mL | INTRAVENOUS | Status: DC | PRN

## 2018-10-24 MED ORDER — SODIUM CHLORIDE 0.9% FLUSH: 3.0000 mL | Freq: Two times a day (BID) | INTRAVENOUS | Status: DC

## 2018-10-24 MED ORDER — ACETAMINOPHEN 325 MG PO TABS: 650.0000 mg | ORAL_TABLET | ORAL | Status: DC | PRN

## 2018-10-24 MED ORDER — HEPARIN (PORCINE) IN NACL 1000-0.9 UT/500ML-% IV SOLN
INTRAVENOUS | Status: DC | PRN
  Administered 2018-10-24: 500 mL

## 2018-10-24 MED ORDER — HYDRALAZINE HCL 20 MG/ML IJ SOLN: 10.0000 mg | INTRAMUSCULAR | Status: AC | PRN

## 2018-10-24 MED ORDER — HEPARIN (PORCINE) IN NACL 1000-0.9 UT/500ML-% IV SOLN
INTRAVENOUS | Status: AC
  Filled 2018-10-24: qty 1000

## 2018-10-24 SURGICAL SUPPLY — 9 items
CATH INFINITI 5 FR JL3.5 (CATHETERS) ×2 IMPLANT
CATH INFINITI 5FR ANG PIGTAIL (CATHETERS) ×2 IMPLANT
CATH INFINITI JR4 5F (CATHETERS) ×2 IMPLANT
DEVICE RAD TR BAND REGULAR (VASCULAR PRODUCTS) ×2 IMPLANT
GLIDESHEATH SLEND SS 6F .021 (SHEATH) ×2 IMPLANT
KIT MANI 3VAL PERCEP (MISCELLANEOUS) ×2 IMPLANT
PACK CARDIAC CATH (CUSTOM PROCEDURE TRAY) ×2 IMPLANT
WIRE HITORQ VERSACORE ST 145CM (WIRE) ×2 IMPLANT
WIRE ROSEN-J .035X260CM (WIRE) ×2 IMPLANT

## 2018-10-24 NOTE — Progress Notes (Signed)
[facility] [place] [facility]   PATIENT NAME: Mike Townsend    MR#:  [id number]  DATE OF BIRTH:  [birth year]  SUBJECTIVE:  CHIEF COMPLAINT: Patient was seen after cardiac cath in Cath Lab.  Feeling okay  REVIEW OF SYSTEMS:  CONSTITUTIONAL: No fever, fatigue or weakness.  EYES: No blurred or double vision.  EARS, NOSE, AND THROAT: No tinnitus or ear pain.  RESPIRATORY: No cough, shortness of breath, wheezing or hemoptysis.  CARDIOVASCULAR: Anterior chest wall  pain is improving, denies orthopnea, edema.  GASTROINTESTINAL: No nausea, vomiting, diarrhea or abdominal pain.  GENITOURINARY: No dysuria, hematuria.  ENDOCRINE: No polyuria, nocturia,  HEMATOLOGY: No anemia, easy bruising or bleeding SKIN: No rash or lesion. MUSCULOSKELETAL: No joint pain or arthritis.   NEUROLOGIC: No tingling, numbness, weakness.  PSYCHIATRY: No anxiety or depression.   DRUG ALLERGIES:  No Known Allergies  VITALS:  Blood pressure 134/77, pulse 69, temperature 98.6 F (37 C), temperature source Oral, resp. rate 19, height 6\' 2"  (1.88 m), weight 108.7 kg, SpO2 97 %.  PHYSICAL EXAMINATION:  GENERAL:  73 y.o.-year-old patient lying in the bed with no acute distress.  EYES: Pupils equal, round, reactive to light and accommodation. No scleral icterus. Extraocular muscles intact.  HEENT: Head atraumatic, normocephalic. Oropharynx and nasopharynx clear.  NECK:  Supple, no jugular venous distention. No thyroid enlargement, no tenderness.  LUNGS: Normal breath sounds bilaterally, no wheezing, rales,rhonchi or crepitation. No use of accessory muscles of respiration.  CARDIOVASCULAR: S1, S2 normal. No murmurs, rubs, or gallops.  Anterior chest wall tenderness on palpation is minimal ABDOMEN: Soft, nontender, nondistended. Bowel sounds present.  EXTREMITIES: Right wrist cath site in clean bandage no pedal edema, cyanosis, or clubbing.  NEUROLOGIC: Cranial nerves II through XII are  intact. Muscle strength at his baseline in all extremities. Sensation intact. Gait not checked.  PSYCHIATRIC: The patient is alert and oriented x 3.  SKIN: No obvious rash, lesion, or ulcer.    LABORATORY PANEL:   CBC Recent Labs  Lab 10/23/18 0415  WBC 8.9  HGB 11.7*  HCT 35.2*  PLT 113*   ------------------------------------------------------------------------------------------------------------------  Chemistries  Recent Labs  Lab 10/22/18 0451  10/23/18 0415  NA  --    < > 133*  K  --    < > 4.3  CL  --    < > 107  CO2  --    < > 19*  GLUCOSE  --    < > 113*  BUN  --    < > 34*  CREATININE  --    < > 1.31*  CALCIUM  --    < > 8.1*  AST 43*  --   --   ALT 45*  --   --   ALKPHOS 43  --   --   BILITOT 1.0  --   --    < > = values in this interval not displayed.   ------------------------------------------------------------------------------------------------------------------  Cardiac Enzymes Recent Labs  Lab 10/21/18 2251  TROPONINI <0.03   ------------------------------------------------------------------------------------------------------------------  RADIOLOGY:  [place] Abdomen Complete  Result Date: 10/22/2018 CLINICAL DATA:  Thrombocytopenia. EXAM: ABDOMEN ULTRASOUND COMPLETE COMPARISON:  Ultrasound 10/21/2018. FINDINGS: Gallbladder: No gallstones or wall thickening visualized. No sonographic Murphy sign noted by sonographer. Common bile duct: Diameter: 5.0 mm Liver: Increased echogenicity consistent fatty infiltration or hepatocellular disease. Portal vein is patent on color Doppler imaging with normal direction of blood flow towards the liver. IVC: No abnormality visualized. Pancreas: Visualized portion  unremarkable. Spleen: Size and appearance within normal limits. Multiple small echogenic foci noted most consistent calcified granulomas. Right Kidney: Length: 11.2 cm. Echogenicity within normal limits. No mass or hydronephrosis visualized. Left Kidney: Length:  12.2 cm. Echogenicity within normal limits. No hydronephrosis visualized. 1.2 cm simple cyst. Abdominal aorta: No aneurysm visualized. Other findings: None. IMPRESSION: 1. Increased hepatic echogenicity consistent fatty infiltration or hepatocellular disease. 2.  No gallstones or biliary distention. 3.  1.2 cm simple cyst left kidney. Electronically Signed   By: Marcello Moores  Register   On: 10/22/2018 15:10    EKG:   Orders placed or performed during the hospital encounter of 10/20/18  . ED EKG  . ED EKG    ASSESSMENT AND PLAN:   This is a 73 year old male admitted for chest pain #.  Chest pain probably from pericarditis as per preliminary report Monitor patient on telemetry and troponins are negative so far-less than 0.033.   Paced rhythm therefore EKG difficult to determine ischemic changes.  Patient's chest pain significantly improved.  Troponins are negative   VQ scan is negative for pulmonary embolism discontinued heparin drip  Muscle relaxant as needed Primary cardiac cath report-pericarditis-not considering NSAIDs at this time in view of acute kidney injury Cardiac catheter results were discussed with the patient and his daughter by cardiology  #GERD with history of gastric erosions and GI bleed in 2012 Protonix, GI consult discussed with Dr. Marius Ditch- unlikely  Recommending to continue Protonix 40 mg twice a day Outpatient follow-up with gastroenterology in 1 to 2 weeks after discharge  #Macrocytic anemia and thrombocytopenia Normal ferritin and folate levels ultrasound of the abdomen to evaluate liver and spleen-reveals fatty liver no gallstones B12 subcutaneously Outpatient hematology follow-up is recommended GI recommending viral hepatitis panel check, outpatient EGD   #.  Acute kidney injury on chronic kidney disease- IV fluids, avoid nephrotoxins and discontinued allopurinol Renal ultrasound-no acute findings We will monitor intake and output. Foley catheter for close  monitoring of the output Will consider renal consult if no improvement Nephrology is following Creatinine is improving 2.5-1.5-131 IV fluids with bicarb-to prevent contrast-induced renal injury Check a.m. labs   # Hypertension: Controlled; continue amlodipine  #   Atrial fibrillation: Rate controlled; continue amiodarone.  Resume Eliquis following PCI.  Patient scheduled for cardiac cath on Wednesday even prior to this admission  #.  Gout: Controlled; holding allopurinol  #Tobacco abuse disorder counseled patient to quit using pipes.  He is agreeable.  Will think about nicotine patch  .  DVT prophylaxis: Therapeutic anticoagulation  .  GI prophylaxis: PPI     All the records are reviewed and case discussed with Care Management/Social Workerr. Management plans discussed with the patient, daughter Ria Comment over phone [phone] and they are in agreement.  CODE STATUS: fc   TOTAL TIME TAKING CARE OF THIS PATIENT: 39  minutes.   POSSIBLE D/C IN 1-2 DAYS, DEPENDING ON CLINICAL CONDITION.  Note: This dictation was prepared with Dragon dictation along with smaller phrase technology. Any transcriptional errors that result from this process are unintentional.   Nicholes Mango M.D on 10/24/2018 at 2:42 PM  Between 7am to 6pm - Pager - [phone] After 6pm go to [email or web address] - password [email or web address] [place]  Office  [phone]  CC: Primary care physician; Rusty Aus, MD

## 2018-10-24 NOTE — Progress Notes (Signed)
PT Cancellation Note  Patient Details Name: Mike Townsend MRN: [id number] DOB: Apr 06, 1946   Cancelled Treatment:    Reason Eval/Treat Not Completed: Other (comment)(Pt out of room at this time for cardiac catherization. PT will re-attempt as able with cardiology clearance for mobilization.)   Lieutenant Diego PT, DPT 1:26 PM,10/24/18 [phone]

## 2018-10-24 NOTE — Progress Notes (Signed)
Patient off floor for cardiac cath. Report given to specials prior to transfer.

## 2018-10-24 NOTE — Plan of Care (Signed)
  Problem: Clinical Measurements: Goal: Respiratory complications will improve Outcome: Progressing Note: On room air   Problem: Activity: Goal: Risk for activity intolerance will decrease Outcome: Progressing Note: Independent in room, steady on feet, tolerating well   Problem: Elimination: Goal: Will not experience complications related to urinary retention Outcome: Progressing   Problem: Pain Managment: Goal: General experience of comfort will improve Outcome: Progressing Note: No complaints of pain this shift   Problem: Safety: Goal: Ability to remain free from injury will improve Outcome: Progressing   Problem: Skin Integrity: Goal: Risk for impaired skin integrity will decrease Outcome: Progressing   Problem: Education: Goal: Knowledge of General Education information will improve Description: Including pain rating scale, medication(s)/side effects and non-pharmacologic comfort measures Outcome: Completed/Met   Problem: Nutrition: Goal: Adequate nutrition will be maintained Outcome: Completed/Met   Problem: Coping: Goal: Level of anxiety will decrease Outcome: Completed/Met

## 2018-10-24 NOTE — Progress Notes (Signed)
Patient returned from cath lab s/p R radial cath.  Dressing D/I. +CMST. Patient without complaints at this time.

## 2018-10-24 NOTE — Progress Notes (Signed)
[facility]  ROUNDING NOTE   Subjective:   Cardiac catheterization this morning. Medical management recommended  NS at 17825mL/hr  Objective:  Vital signs in last 24 hours:  Temp:  [97.8 F (36.6 C)-100.4 F (38 C)] 98.6 F (37 C) (06/17 1355) Pulse Rate:  [60-70] 69 (06/17 1355) Resp:  [17-26] 19 (06/17 1355) BP: (105-135)/(67-95) 134/77 (06/17 1355) SpO2:  [93 %-99 %] 97 % (06/17 1355) Weight:  [108.7 kg] 108.7 kg (06/17 1012)  Weight change: -0.136 kg Filed Weights   10/23/18 0400 10/24/18 0551 10/24/18 1012  Weight: 108.9 kg 108.7 kg 108.7 kg    Intake/Output: I/O last 3 completed shifts: In: 5574.7 [P.O.:720; I.V.:4854.7] Out: 2250 [Urine:2250]   Intake/Output this shift:  No intake/output data recorded.  Physical Exam: General: NAD, sitting up in bed  Head: Normocephalic, atraumatic. Moist oral mucosal membranes  Eyes: Anicteric, PERRL  Neck: Supple, trachea midline  Lungs:  Clear to auscultation  Heart: Regular rate and rhythm  Abdomen:  Soft, nontender,   Extremities: no peripheral edema.  Neurologic: Nonfocal, moving all four extremities  Skin: No lesions        Basic Metabolic Panel: Recent Labs  Lab 10/21/18 0008 10/21/18 1853 10/22/18 0456 10/23/18 0415  NA 140 135 135 133*  K 4.3 4.3 4.1 4.3  CL 105 103 106 107  CO2 24 22 22  19*  GLUCOSE 108* 126* 119* 113*  BUN 49* 41* 40* 34*  CREATININE 2.52* 1.79* 1.50* 1.31*  CALCIUM 9.0 8.3* 8.1* 8.1*    Liver Function Tests: Recent Labs  Lab 10/22/18 0451  AST 43*  ALT 45*  ALKPHOS 43  BILITOT 1.0  PROT 5.7*  ALBUMIN 3.1*   No results for input(s): LIPASE, AMYLASE in the last 168 hours. No results for input(s): AMMONIA in the last 168 hours.  CBC: Recent Labs  Lab 10/21/18 0008 10/22/18 0456 10/23/18 0415  WBC 11.2* 10.9* 8.9  HGB 12.9* 11.1* 11.7*  HCT 38.6* 32.8* 35.2*  MCV 113.9* 111.2* 111.7*  PLT 144* 119* 113*    Cardiac Enzymes: Recent Labs  Lab  10/21/18 0008 10/21/18 0504 10/21/18 1049 10/21/18 1644 10/21/18 2251  TROPONINI <0.03 <0.03 <0.03 <0.03 <0.03    BNP: Invalid input(s): POCBNP  CBG: No results for input(s): GLUCAP in the last 168 hours.  Microbiology: Results for orders placed or performed during the hospital encounter of 10/19/18  Novel Coronavirus, NAA (hospital order; send-out to ref lab)     Status: None   Collection Time: 10/19/18 11:57 AM   Specimen: Nasopharyngeal Swab; Respiratory  Result Value Ref Range Status   SARS-CoV-2, NAA NOT DETECTED NOT DETECTED Final    Comment: (NOTE) This test was developed and its performance characteristics determined by [facility]. This test has not been FDA cleared or approved. This test has been authorized by FDA under an Emergency Use Authorization (EUA). This test is only authorized for the duration of time the declaration that circumstances exist justifying the authorization of the emergency use of in vitro diagnostic tests for detection of SARS-CoV-2 virus and/or diagnosis of COVID-19 infection under section 564(b)(1) of the Act, 21 U.S.C. 161WRU-0(A)(5360bbb-3(b)(1), unless the authorization is terminated or revoked sooner. When diagnostic testing is negative, the possibility of a false negative result should be considered in the context of a patient's recent exposures and the presence of clinical signs and symptoms consistent with COVID-19. An individual without symptoms of COVID-19 and who is not shedding SARS-CoV-2 virus would expect to have a  negative (not detected) result in this assay. Performed  At: [facility] [place], [place] [id number] Rush Farmer MD AO:[id number]    [place]  Final    Comment: Performed at [facility], [address] [place], [place] [address]    Coagulation Studies: No results for input(s): LABPROT, INR in the last 72 hours.  Urinalysis: No results for input(s):  COLORURINE, LABSPEC, PHURINE, GLUCOSEU, HGBUR, BILIRUBINUR, KETONESUR, PROTEINUR, UROBILINOGEN, NITRITE, LEUKOCYTESUR in the last 72 hours.  Invalid input(s): APPERANCEUR    Imaging: No results found.   Medications:   . sodium chloride 125 mL/hr at 10/24/18 0759  . sodium chloride    . sodium chloride     . amiodarone  100 mg Oral Daily  . aspirin EC  325 mg Oral Daily  . cyanocobalamin  1,000 mcg Subcutaneous Daily  . docusate sodium  100 mg Oral BID  . isosorbide mononitrate  30 mg Oral Daily  . lidocaine  1 patch Transdermal Q24H  . magnesium oxide  400 mg Oral Daily  . metoprolol succinate  50 mg Oral Daily  . pantoprazole  40 mg Oral BID  . simvastatin  20 mg Oral QHS  . sodium chloride flush  3 mL Intravenous Q12H  . sodium chloride flush  3 mL Intravenous Q12H   sodium chloride, acetaminophen **OR** acetaminophen, acetaminophen, cyclobenzaprine, hydrALAZINE, labetalol, morphine injection, nitroGLYCERIN, ondansetron **OR** ondansetron (ZOFRAN) IV, ondansetron (ZOFRAN) IV, sodium chloride flush  Assessment/ Plan:  Mr. Mike Townsend is a 73 y.o.  male with gout, hyperlipidemia, hypertension, paroxysmal atrial fibrillation, chronic kidney disease stage III baseline creatinine 1.2, who was admitted to [facility] on 10/20/2018 for evaluation of chest pain.   1.  Acute renal failure on chronic kidney disease stage III.  Baseline creatinine 1.5, GFR of 46.  Chronic kidney disease secondary to hypertension.  - IV normal saline infusion before and after cardiac catheterization  2.   Hypertension.  - amlodipine. Discussed switching to an ACE-I/ARB when outpatient.   3. Anemia with chronic kidney disease: macrocytic. With thrombocytopenia - GI input appreciated.    LOS: 3 Buel Molder 6/17/20203:22 PM

## 2018-10-24 NOTE — Care Management Important Message (Signed)
Important Message  Patient Details  Name: Mike Townsend MRN: [id number] Date of Birth: [birth year]   Medicare Important Message Given:  Yes    Dannette Barbara 10/24/2018, 11:20 AM

## 2018-10-24 NOTE — Progress Notes (Signed)
Removed TR band and noticed small hematoma develop. Cath Lab staff assisted in compressing the hematoma. The area flattened to level 0 and pressure dressing applied. Monitored patient additional time prior to transporting to floor.

## 2018-10-24 NOTE — Progress Notes (Addendum)
Transported patient to room 237 and provided update report to [place], [place] concerning extended monitoring time for resolved level 1 hematoma. Patient's wrist dressing remained clean, dry, intact with no drainage, radial pulse +2.

## 2018-10-25 DIAGNOSIS — D510 Vitamin B12 deficiency anemia due to intrinsic factor deficiency: Secondary | ICD-10-CM

## 2018-10-25 LAB — BASIC METABOLIC PANEL
Anion gap: 7 (ref 5–15)
BUN: 22 mg/dL (ref 8–23)
CO2: 18 mmol/L — ABNORMAL LOW (ref 22–32)
Calcium: 7.7 mg/dL — ABNORMAL LOW (ref 8.9–10.3)
Chloride: 111 mmol/L (ref 98–111)
Creatinine, Ser: 1.35 mg/dL — ABNORMAL HIGH (ref 0.61–1.24)
GFR calc Af Amer: >60 mL/min (ref >60)
GFR calc non Af Amer: 52 mL/min — ABNORMAL LOW (ref >60)
Glucose, Bld: 116 mg/dL — ABNORMAL HIGH (ref 70–99)
Potassium: 3.9 mmol/L (ref 3.5–5.1)
Sodium: 136 mmol/L (ref 135–145)

## 2018-10-25 LAB — CBC
HCT: 33.1 % — ABNORMAL LOW (ref 39.0–52.0)
Hemoglobin: 10.8 g/dL — ABNORMAL LOW (ref 13.0–17.0)
MCH: 37 pg — ABNORMAL HIGH (ref 26.0–34.0)
MCHC: 32.6 g/dL (ref 30.0–36.0)
MCV: 113.4 fL — ABNORMAL HIGH (ref 80.0–100.0)
Platelets: 144 10*3/uL — ABNORMAL LOW (ref 150–400)
RBC: 2.92 MIL/uL — ABNORMAL LOW (ref 4.22–5.81)
RDW: 14.2 % (ref 11.5–15.5)
WBC: 9.2 10*3/uL (ref 4.0–10.5)
nRBC: 0 % (ref 0.0–0.2)

## 2018-10-25 LAB — ANTI-PARIETAL ANTIBODY: Parietal Cell Antibody-IgG: 23.1 Units — ABNORMAL HIGH (ref 0.0–20.0)

## 2018-10-25 MED ORDER — APIXABAN 5 MG PO TABS: 5.0000 mg | ORAL_TABLET | Freq: Two times a day (BID) | ORAL | Status: DC

## 2018-10-25 MED ORDER — PANTOPRAZOLE SODIUM 40 MG PO TBEC: 40.0000 mg | DELAYED_RELEASE_TABLET | Freq: Two times a day (BID) | ORAL | 0 refills | Status: DC

## 2018-10-25 MED ORDER — ISOSORBIDE MONONITRATE ER 30 MG PO TB24: 30.0000 mg | ORAL_TABLET | Freq: Every day | ORAL | 0 refills | Status: DC

## 2018-10-25 MED ORDER — ASPIRIN 81 MG PO TBEC: 81.0000 mg | DELAYED_RELEASE_TABLET | Freq: Two times a day (BID) | ORAL | 0 refills | Status: DC

## 2018-10-25 MED ORDER — DOCUSATE SODIUM 100 MG PO CAPS: 100.0000 mg | ORAL_CAPSULE | Freq: Two times a day (BID) | ORAL | 0 refills | Status: DC | PRN

## 2018-10-25 MED ORDER — ACETAMINOPHEN 325 MG PO TABS: 650.0000 mg | ORAL_TABLET | Freq: Four times a day (QID) | ORAL | Status: AC | PRN

## 2018-10-25 MED ORDER — SIMVASTATIN 80 MG PO TABS: 20.0000 mg | ORAL_TABLET | Freq: Every day | ORAL | 0 refills | Status: DC

## 2018-10-25 MED ORDER — METOPROLOL SUCCINATE ER 50 MG PO TB24: 50.0000 mg | ORAL_TABLET | Freq: Every day | ORAL | 0 refills | Status: DC

## 2018-10-25 MED ORDER — ASPIRIN EC 81 MG PO TBEC: 81.0000 mg | DELAYED_RELEASE_TABLET | Freq: Every day | ORAL | Status: DC

## 2018-10-25 MED ORDER — CYANOCOBALAMIN 1000 MCG/ML IJ SOLN: 1000.0000 ug | INTRAMUSCULAR | 0 refills | Status: AC

## 2018-10-25 NOTE — Progress Notes (Signed)
[facility] Cardiology  SUBJECTIVE: Patient laying comfortably in bed, has intermittent mild pleuritic chest pain   Vitals:   10/24/18 1925 10/25/18 0324 10/25/18 0324 10/25/18 0726  BP:   130/76 126/77  Pulse: 67  68 67  Resp:   20 19  Temp:   99.1 F (37.3 C) 100 F (37.8 C)  TempSrc:   Oral Oral  SpO2: 93%  92% 93%  Weight:  111.9 kg    Height:         Intake/Output Summary (Last 24 hours) at 10/25/2018 0805 Last data filed at 10/25/2018 0214 Gross per 24 hour  Intake 240 ml  Output 750 ml  Net -510 ml      PHYSICAL EXAM  General: Well developed, well nourished, in no acute distress HEENT:  Normocephalic and atramatic Neck:  No JVD.  Lungs: Clear bilaterally to auscultation and percussion. Heart: HRRR . Normal S1 and S2 without gallops or murmurs.  Abdomen: Bowel sounds are positive, abdomen soft and non-tender  Msk:  Back normal, normal gait. Normal strength and tone for age. Extremities: No clubbing, cyanosis or edema.   Neuro: Alert and oriented X 3. Psych:  Good affect, responds appropriately   LABS: Basic Metabolic Panel: Recent Labs    10/23/18 0415 10/25/18 0430  NA 133* 136  K 4.3 3.9  CL 107 111  CO2 19* 18*  GLUCOSE 113* 116*  BUN 34* 22  CREATININE 1.31* 1.35*  CALCIUM 8.1* 7.7*   Liver Function Tests: No results for input(s): AST, ALT, ALKPHOS, BILITOT, PROT, ALBUMIN in the last 72 hours. No results for input(s): LIPASE, AMYLASE in the last 72 hours. CBC: Recent Labs    10/23/18 0415 10/25/18 0430  WBC 8.9 9.2  HGB 11.7* 10.8*  HCT 35.2* 33.1*  MCV 111.7* 113.4*  PLT 113* 144*   Cardiac Enzymes: No results for input(s): CKTOTAL, CKMB, CKMBINDEX, TROPONINI in the last 72 hours. BNP: Invalid input(s): POCBNP D-Dimer: No results for input(s): DDIMER in the last 72 hours. Hemoglobin A1C: No results for input(s): HGBA1C in the last 72 hours. Fasting Lipid Panel: No results for input(s): CHOL, HDL, LDLCALC, TRIG, CHOLHDL, LDLDIRECT in  the last 72 hours. Thyroid Function Tests: No results for input(s): TSH, T4TOTAL, T3FREE, THYROIDAB in the last 72 hours.  Invalid input(s): FREET3 Anemia Panel: No results for input(s): VITAMINB12, FOLATE, FERRITIN, TIBC, IRON, RETICCTPCT in the last 72 hours.  No results found.   Echo LVEF 40 to 45% with small pericardial fusion  TELEMETRY: Sinus rhythm:  ASSESSMENT AND PLAN:  Active Problems:   NSTEMI (non-ST elevated myocardial infarction) ([place])    1.  Pleuritic chest pain, negative troponin, paced rhythm, no pericardial rub, proving, on aspirin 2.  Heavily calcified 70-75% stenosis small caliber mid LAD, likely non-culprit, would require Rotablator 3.  Small pericardial effusion 4.  Acute renal failure with underlying CKD stage III, improved after IV normal saline infusion, stable after cardiac catheterization without evidence of contrast-induced nephrotoxicity  Recommendations  1.  Agree with current therapy 2.  Enteric aspirin 325 mg daily 3.  Defer nonsteroidals and colchicine in light of labile renal function 4.  Defer Rotablator of mid LAD, pursue initial medical therapy 5.  Discharge home today, follow-up in 1 week   Isaias Cowman, MD, PhD, [facility] 10/25/2018 8:05 AM

## 2018-10-25 NOTE — Progress Notes (Signed)
[facility]  ROUNDING NOTE   Subjective:   Patient with cough  Objective:  Vital signs in last 24 hours:  Temp:  [99.1 F (37.3 C)-100 F (37.8 C)] 100 F (37.8 C) (06/18 0726) Pulse Rate:  [66-71] 67 (06/18 0726) Resp:  [19-20] 19 (06/18 0726) BP: (106-130)/(67-77) 126/77 (06/18 0726) SpO2:  [92 %-97 %] 93 % (06/18 0726) Weight:  [111.9 kg] 111.9 kg (06/18 0324)  Weight change: -0.027 kg Filed Weights   10/24/18 0551 10/24/18 1012 10/25/18 0324  Weight: 108.7 kg 108.7 kg 111.9 kg    Intake/Output: I/O last 3 completed shifts: In: 5334.7 [P.O.:480; I.V.:4854.7] Out: 1400 [Urine:1400]   Intake/Output this shift:  Total I/O In: -  Out: 325 [Urine:325]  Physical Exam: General: NAD, sitting up in bed  Head: Normocephalic, atraumatic. Moist oral mucosal membranes  Eyes: Anicteric, PERRL  Neck: Supple, trachea midline  Lungs:  Clear to auscultation  Heart: Regular rate and rhythm  Abdomen:  Soft, nontender,   Extremities: no peripheral edema.  Neurologic: Nonfocal, moving all four extremities  Skin: No lesions        Basic Metabolic Panel: Recent Labs  Lab 10/21/18 0008 10/21/18 1853 10/22/18 0456 10/23/18 0415 10/25/18 0430  NA 140 135 135 133* 136  K 4.3 4.3 4.1 4.3 3.9  CL 105 103 106 107 111  CO2 24 22 22  19* 18*  GLUCOSE 108* 126* 119* 113* 116*  BUN 49* 41* 40* 34* 22  CREATININE 2.52* 1.79* 1.50* 1.31* 1.35*  CALCIUM 9.0 8.3* 8.1* 8.1* 7.7*    Liver Function Tests: Recent Labs  Lab 10/22/18 0451  AST 43*  ALT 45*  ALKPHOS 43  BILITOT 1.0  PROT 5.7*  ALBUMIN 3.1*   No results for input(s): LIPASE, AMYLASE in the last 168 hours. No results for input(s): AMMONIA in the last 168 hours.  CBC: Recent Labs  Lab 10/21/18 0008 10/22/18 0456 10/23/18 0415 10/25/18 0430  WBC 11.2* 10.9* 8.9 9.2  HGB 12.9* 11.1* 11.7* 10.8*  HCT 38.6* 32.8* 35.2* 33.1*  MCV 113.9* 111.2* 111.7* 113.4*  PLT 144* 119* 113* 144*    Cardiac  Enzymes: Recent Labs  Lab 10/21/18 0008 10/21/18 0504 10/21/18 1049 10/21/18 1644 10/21/18 2251  TROPONINI <0.03 <0.03 <0.03 <0.03 <0.03    BNP: Invalid input(s): POCBNP  CBG: No results for input(s): GLUCAP in the last 168 hours.  Microbiology: Results for orders placed or performed during the hospital encounter of 10/19/18  Novel Coronavirus, NAA (hospital order; send-out to ref lab)     Status: None   Collection Time: 10/19/18 11:57 AM   Specimen: Nasopharyngeal Swab; Respiratory  Result Value Ref Range Status   SARS-CoV-2, NAA NOT DETECTED NOT DETECTED Final    Comment: (NOTE) This test was developed and its performance characteristics determined by [facility]. This test has not been FDA cleared or approved. This test has been authorized by FDA under an Emergency Use Authorization (EUA). This test is only authorized for the duration of time the declaration that circumstances exist justifying the authorization of the emergency use of in vitro diagnostic tests for detection of SARS-CoV-2 virus and/or diagnosis of COVID-19 infection under section 564(b)(1) of the Act, 21 U.S.C. 865HQI-6(N)(6360bbb-3(b)(1), unless the authorization is terminated or revoked sooner. When diagnostic testing is negative, the possibility of a false negative result should be considered in the context of a patient's recent exposures and the presence of clinical signs and symptoms consistent with COVID-19. An individual without symptoms of  COVID-19 and who is not shedding SARS-CoV-2 virus would expect to have a negative (not detected) result in this assay. Performed  At: [facility] [place], [place] [id number] Rush Farmer MD WU:[id number]    [place]  Final    Comment: Performed at [facility], [address] [place], [place] [address]    Coagulation Studies: No results for input(s): LABPROT, INR in the last 72  hours.  Urinalysis: No results for input(s): COLORURINE, LABSPEC, PHURINE, GLUCOSEU, HGBUR, BILIRUBINUR, KETONESUR, PROTEINUR, UROBILINOGEN, NITRITE, LEUKOCYTESUR in the last 72 hours.  Invalid input(s): APPERANCEUR    Imaging: No results found.   Medications:   . sodium chloride Stopped (10/25/18 0735)  . sodium chloride     . amiodarone  100 mg Oral Daily  . apixaban  5 mg Oral BID  . [START ON 10/26/2018] aspirin EC  81 mg Oral Daily  . docusate sodium  100 mg Oral BID  . isosorbide mononitrate  30 mg Oral Daily  . lidocaine  1 patch Transdermal Q24H  . magnesium oxide  400 mg Oral Daily  . metoprolol succinate  50 mg Oral Daily  . pantoprazole  40 mg Oral BID  . simvastatin  20 mg Oral QHS  . sodium chloride flush  3 mL Intravenous Q12H  . sodium chloride flush  3 mL Intravenous Q12H   sodium chloride, acetaminophen **OR** acetaminophen, acetaminophen, cyclobenzaprine, morphine injection, nitroGLYCERIN, ondansetron **OR** ondansetron (ZOFRAN) IV, ondansetron (ZOFRAN) IV, sodium chloride flush  Assessment/ Plan:  Mr. Mike Townsend is a 73 y.o.  male with gout, hyperlipidemia, hypertension, paroxysmal atrial fibrillation, chronic kidney disease stage III baseline creatinine 1.2, who was admitted to [facility] on 10/20/2018 for evaluation of chest pain.   1.  Acute renal failure on chronic kidney disease stage III.  Baseline creatinine 1.5, GFR of 46.  Chronic kidney disease secondary to hypertension.  - IV normal saline infusion before and after cardiac catheterization - Needs hospital follow up with Nephrology.   2.   Hypertension.  - amlodipine. Discussed switching to an ACE-I/ARB when outpatient.   3. Anemia with chronic kidney disease: macrocytic. With thrombocytopenia - GI input appreciated.    LOS: [place] 6/18/20202:00 PM

## 2018-10-25 NOTE — Progress Notes (Signed)
Arlyss Repressohini R , MD [address]  [address]  [place], [place] [address]  Main: [phone]  Fax: [phone] Pager: [phone]   Subjective: Patient is lying comfortably in bed, reports having mild pleuritic chest pain.   Objective: Vital signs in last 24 hours: Vitals:   10/24/18 1925 10/25/18 0324 10/25/18 0324 10/25/18 0726  BP:   130/76 126/77  Pulse: 67  68 67  Resp:   20 19  Temp:   99.1 F (37.3 C) 100 F (37.8 C)  TempSrc:   Oral Oral  SpO2: 93%  92% 93%  Weight:  111.9 kg    Height:       Weight change: -0.027 kg  Intake/Output Summary (Last 24 hours) at 10/25/2018 1216 Last data filed at 10/25/2018 1111 Gross per 24 hour  Intake 240 ml  Output 1075 ml  Net -835 ml     Exam: Heart:: Regular rate and rhythm, S1S2 present or without murmur or extra heart sounds Lungs: normal and clear to auscultation Abdomen: soft, nontender, normal bowel sounds   Lab Results: CBC Latest Ref Rng & Units 10/25/2018 10/23/2018 10/22/2018  WBC 4.0 - 10.5 K/uL 9.2 8.9 10.9(H)  Hemoglobin 13.0 - 17.0 g/dL 10.8(L) 11.7(L) 11.1(L)  Hematocrit 39.0 - 52.0 % 33.1(L) 35.2(L) 32.8(L)  Platelets 150 - 400 K/uL 144(L) 113(L) 119(L)   CMP Latest Ref Rng & Units 10/25/2018 10/23/2018 10/22/2018  Glucose 70 - 99 mg/dL 951(O116(H) 841(Y113(H) 606(T119(H)  BUN 8 - 23 mg/dL 22 01(S34(H) 01(U40(H)  Creatinine 0.61 - 1.24 mg/dL 9.32(T1.35(H) 5.57(D1.31(H) 2.20(U1.50(H)  Sodium 135 - 145 mmol/L 136 133(L) 135  Potassium 3.5 - 5.1 mmol/L 3.9 4.3 4.1  Chloride 98 - 111 mmol/L 111 107 106  CO2 22 - 32 mmol/L 18(L) 19(L) 22  Calcium 8.9 - 10.3 mg/dL 7.7(L) 8.1(L) 8.1(L)  Total Protein 6.5 - 8.1 g/dL - - 5.7(L)  Total Bilirubin 0.3 - 1.2 mg/dL - - 1.0  Alkaline Phos 38 - 126 U/L - - 43  AST 15 - 41 U/L - - 43(H)  ALT 0 - 44 U/L - - 45(H)    Micro Results: Recent Results (from the past 240 hour(s))  Novel Coronavirus, NAA (hospital order; send-out to ref lab)     Status: None   Collection Time: 10/19/18 11:57 AM   Specimen: Nasopharyngeal Swab; Respiratory  Result Value Ref Range Status   SARS-CoV-2, NAA NOT DETECTED NOT DETECTED Final    Comment: (NOTE) This test was developed and its performance characteristics determined by [facility]. This test has not been FDA cleared or approved. This test has been authorized by FDA under an Emergency Use Authorization (EUA). This test is only authorized for the duration of time the declaration that circumstances exist justifying the authorization of the emergency use of in vitro diagnostic tests for detection of SARS-CoV-2 virus and/or diagnosis of COVID-19 infection under section 564(b)(1) of the Act, 21 U.S.C. 542HCW-2(B)(7360bbb-3(b)(1), unless the authorization is terminated or revoked sooner. When diagnostic testing is negative, the possibility of a false negative result should be considered in the context of a patient's recent exposures and the presence of clinical signs and symptoms consistent with COVID-19. An individual without symptoms of COVID-19 and who is not shedding SARS-CoV-2 virus would expect to have a negative (not detected) result in this assay. Performed  At: [facility] [place] [address] [place], [place] [id number] Jolene SchimkeNagendra Sanjai MD HY:[id number]:[phone]    Coronavirus Source NASOPHARYNGEAL  Final    Comment: Performed  at [facility], [address] [place], [place] [address]   Studies/Results: No results found. Medications:  I have reviewed the patient's current medications. Prior to Admission:  Medications Prior to Admission  Medication Sig Dispense Refill Last Dose  . allopurinol (ZYLOPRIM) 300 MG tablet Take 300 mg by mouth daily.   10/20/2018 at Unknown time  . amiodarone (PACERONE) 200 MG tablet Take 100 mg by mouth daily.    10/20/2018 at Unknown time  . amLODipine (NORVASC) 5 MG tablet Take 5 mg by mouth every evening.    Past Week at Unknown time  . ELIQUIS 5 MG TABS tablet Take 5 mg by mouth 2 (two) times a day.    10/20/2018 at 0530  . magnesium oxide (MAG-OX) 400 MG tablet Take 400 mg by mouth daily.   10/20/2018 at Unknown time  . Saw Palmetto, Serenoa repens, (SAW PALMETTO PO) Take 1 capsule by mouth daily.   10/20/2018 at Unknown time  . [DISCONTINUED] simvastatin (ZOCOR) 20 MG tablet Take 20 mg by mouth at bedtime.   Past Week at Unknown time   Scheduled: . amiodarone  100 mg Oral Daily  . apixaban  5 mg Oral BID  . [START ON 10/26/2018] aspirin EC  81 mg Oral Daily  . docusate sodium  100 mg Oral BID  . isosorbide mononitrate  30 mg Oral Daily  . lidocaine  1 patch Transdermal Q24H  . magnesium oxide  400 mg Oral Daily  . metoprolol succinate  50 mg Oral Daily  . pantoprazole  40 mg Oral BID  . simvastatin  20 mg Oral QHS  . sodium chloride flush  3 mL Intravenous Q12H  . sodium chloride flush  3 mL Intravenous Q12H   Continuous: . sodium chloride Stopped (10/25/18 0735)  . sodium chloride     XBJ:[id number]:sodium chloride, acetaminophen **OR** acetaminophen, acetaminophen, cyclobenzaprine, morphine injection, nitroGLYCERIN, ondansetron **OR** ondansetron (ZOFRAN) IV, ondansetron (ZOFRAN) IV, sodium chloride flush Anti-infectives (From admission, onward)   None     Scheduled Meds: . amiodarone  100 mg Oral Daily  . apixaban  5 mg Oral BID  . [START ON 10/26/2018] aspirin EC  81 mg Oral Daily  . docusate sodium  100 mg Oral BID  . isosorbide mononitrate  30 mg Oral Daily  . lidocaine  1 patch Transdermal Q24H  . magnesium oxide  400 mg Oral Daily  . metoprolol succinate  50 mg Oral Daily  . pantoprazole  40 mg Oral BID  . simvastatin  20 mg Oral QHS  . sodium chloride flush  3 mL Intravenous Q12H  . sodium chloride flush  3 mL Intravenous Q12H   Continuous Infusions: . sodium chloride Stopped (10/25/18 0735)  . 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 Loraine LericheMark  Miller next week He is also refusing B12 injections as outpatient until he discusses about it with his PCP Patient is being discharged home today  GI will sign off   LOS: 4 days     10/25/2018, 12:16 PM

## 2018-10-25 NOTE — Discharge Instructions (Signed)
Follow-up with primary care physician in 2 to 3 days. Follow-up with nephrology in 1 week Follow-up with gastroenterology in 2 weeks an outpatient EGD is recommended Follow-up with hematology in 1 to 2 weeks

## 2018-10-25 NOTE — Discharge Summary (Signed)
[facility] - [place] at [facility]   PATIENT NAME: Mike Townsend    MR#:  [id number]  DATE OF BIRTH:  1945/07/15  DATE OF ADMISSION:  10/20/2018 ADMITTING PHYSICIAN: Arnaldo Natal, MD  DATE OF DISCHARGE:  10/25/18  PRIMARY CARE PHYSICIAN: Danella Penton, MD    ADMISSION DIAGNOSIS:  Unstable angina (HCC) [I20.0]  DISCHARGE DIAGNOSIS:  Active Problems:   NSTEMI (non-ST elevated myocardial infarction) (HCC) AKI  Vit B12 def   SECONDARY DIAGNOSIS:   Past Medical History:  Diagnosis Date  . Gout   . Hyperlipidemia   . Hypertension   . Paroxysmal atrial fibrillation [facility])     HOSPITAL COURSE:  HPI: The patient with past medical history of hypertension, hyperlipidemia and atrial fibrillation presents to the emergency department complaining of chest pain.  The pain awoke the patient from sleep.  That he describes the pain as "somebody stabbing him in the chest".  The pain radiates throughout his chest and is associated with some shortness of breath with exertion.  He denies nausea, vomiting or diaphoresis.  The patient reports that he is usually very active and likes cardiovascular exercise.  However over the last week or more he has felt very fatigued.  The patient was scheduled for a cardiac catheterization later this week but could not bear the pain tonight which prompted him to seek evaluation.  He received nitroglycerin and aspirin in route to the emergency department.  A heparin drip was started and the hospitalist service was called for admission  Hospital course   This is a 73 year old male admitted for chest pain  #.   Non-STEMI ,pleuritic chest pain probably from small pericardial effusion with possible pericarditis Monitored patient on telemetry and troponins are negative so far-less than 0.033.   Paced rhythm therefore EKG difficult to determine ischemic changes.  Patient's chest pain significantly improved.  Troponins are negative   VQ scan  is negative for pulmonary embolism discontinued heparin drip Muscle relaxant as needed given  Primary cardiac cath report-pericarditis-not considering NSAIDs at this time in view of acute kidney injury Cardiac catheter results were discussed with the patient and his daughter by cardiology.  Heavily calcified 70-75% stenosis small caliber mid LAD, likely non-culprit, would require Rotablator.Defer Rotablator of mid LAD, pursue initial medical therapy per cardiology  Defer nonsteroidals and colchicine in light of labile renal function Echo - 40-45% ef ,mild-moderately reduced systolic function Discharge home today, follow-up with cardio in  1 week Discharge home with aspirin 81 mg enteric-coated twice a day for possible pericarditis, Toprol-XL, high intensity statin, Imdur  #GERD with history of gastric erosions and GI bleed in 2012 Protonix, GI consult discussed with Dr. Allegra Lai- unlikely  Recommending to continue Protonix 40 mg twice a day Outpatient follow-up with gastroenterology in 1 to 2 weeks after discharge  #Macrocytic anemia and thrombocytopenia Normal ferritin and folate levels ultrasound of the abdomen to evaluate liver and spleen-reveals fatty liver no gallstones B12 subcutaneously given, will dc with B12 subcutaneous once weekly for 4 doses.  PCP to follow-up on it Outpatient hematology follow-up is recommended  viral hepatitis panel is neg  outpatient EGD is recommended by gastroenterology.  GI follow-up in 2 weeks is recommended prefers discussing with his primary care physician first   #.  Acute kidney injury on chronic kidney disease- IV fluids, avoid nephrotoxins and discontinued allopurinol Renal ultrasound-no acute findings Nephrology has followed recommending outpatient follow-up with nephrology Creatinine is improving 2.5-1.5-131-1.35 IV fluids with bicarb-to  prevent contrast-induced renal injury given following cardiac cath   # Hypertension: Controlled;  patient is started on metoprolol and discontinued amlodipine  #  Atrial fibrillation: Rate controlled; continue amiodarone. Resumed Eliquis   #. Gout: Controlled;  allopurinol  #Tobacco abuse disorder counseled patient to quit using pipes.  He is agreeable.  Will think about nicotine patch  . DVT prophylaxis: Therapeutic anticoagulation  . GI prophylaxis: PPI DISCHARGE CONDITIONS:   staqble   CONSULTS OBTAINED:  Treatment Team:  Toney ReilVanga, Rohini Reddy, MD Marcina MillardParaschos, Alexander, MD   PROCEDURES cardiac cath   DRUG ALLERGIES:  No Known Allergies  DISCHARGE MEDICATIONS:   Allergies as of 10/25/2018   No Known Allergies     Medication List    STOP taking these medications   amLODipine 5 MG tablet Commonly known as: NORVASC     TAKE these medications   acetaminophen 325 MG tablet Commonly known as: TYLENOL Take 2 tablets (650 mg total) by mouth every 6 (six) hours as needed for mild pain or moderate pain (or Fever >/= 101).   allopurinol 300 MG tablet Commonly known as: ZYLOPRIM Take 300 mg by mouth daily.   amiodarone 200 MG tablet Commonly known as: PACERONE Take 100 mg by mouth daily.   aspirin 81 MG EC tablet Take 1 tablet (81 mg total) by mouth 2 (two) times daily.   cyanocobalamin 1000 MCG/ML injection Commonly known as: (VITAMIN B-12) Inject 1 mL (1,000 mcg total) into the skin once a week for 4 doses.   docusate sodium 100 MG capsule Commonly known as: COLACE Take 1 capsule (100 mg total) by mouth 2 (two) times daily as needed for mild constipation.   Eliquis 5 MG Tabs tablet Generic drug: apixaban Take 5 mg by mouth 2 (two) times a day.   isosorbide mononitrate 30 MG 24 hr tablet Commonly known as: IMDUR Take 1 tablet (30 mg total) by mouth daily. Start taking on: October 26, 2018   magnesium oxide 400 MG tablet Commonly known as: MAG-OX Take 400 mg by mouth daily.   metoprolol succinate 50 MG 24 hr tablet Commonly known as:  TOPROL-XL Take 1 tablet (50 mg total) by mouth daily. Take with or immediately following a meal. Start taking on: October 26, 2018   pantoprazole 40 MG tablet Commonly known as: PROTONIX Take 1 tablet (40 mg total) by mouth 2 (two) times daily.   SAW PALMETTO PO Take 1 capsule by mouth daily.   simvastatin 80 MG tablet Commonly known as: ZOCOR Take 0.5 tablets (40 mg total) by mouth at bedtime. What changed:   medication strength  how much to take        DISCHARGE INSTRUCTIONS:  Follow-up with primary care physician in 2 to 3 days. Follow-up with nephrology in 1 week Follow-up with gastroenterology in 2 weeks an outpatient EGD is recommended Follow-up with hematology in 1 to 2 weeks   DIET:  Cardiac diet  DISCHARGE CONDITION:  Fair  ACTIVITY:  Activity as tolerated  OXYGEN:  Home Oxygen: No.   Oxygen Delivery: room air  DISCHARGE LOCATION:  home   If you experience worsening of your admission symptoms, develop shortness of breath, life threatening emergency, suicidal or homicidal thoughts you must seek medical attention immediately by calling 911 or calling your MD immediately  if symptoms less severe.  You Must read complete instructions/literature along with all the possible adverse reactions/side effects for all the Medicines you take and that have been prescribed to you. Take any  new Medicines after you have completely understood and accpet all the possible adverse reactions/side effects.   Please note  You were cared for by a hospitalist during your hospital stay. If you have any questions about your discharge medications or the care you received while you were in the hospital after you are discharged, you can call the unit and asked to speak with the hospitalist on call if the hospitalist that took care of you is not available. Once you are discharged, your primary care physician will handle any further medical issues. Please note that NO REFILLS for any  discharge medications will be authorized once you are discharged, as it is imperative that you return to your primary care physician (or establish a relationship with a primary care physician if you do not have one) for your aftercare needs so that they can reassess your need for medications and monitor your lab values.     Today  Chief Complaint  Patient presents with  . Chest Pain   Patient is feeling much better denies any chest pain wants to go home.  Okay to discharge patient from cardiology and nephrology and gastroenterology standpoint.   ROS:  CONSTITUTIONAL: Denies fevers, chills. Denies any fatigue, weakness.  EYES: Denies blurry vision, double vision, eye pain. EARS, NOSE, THROAT: Denies tinnitus, ear pain, hearing loss. RESPIRATORY: Denies cough, wheeze, shortness of breath.  CARDIOVASCULAR: Denies chest pain, palpitations, edema.  GASTROINTESTINAL: Denies nausea, vomiting, diarrhea, abdominal pain. Denies bright red blood per rectum. GENITOURINARY: Denies dysuria, hematuria. ENDOCRINE: Denies nocturia or thyroid problems. HEMATOLOGIC AND LYMPHATIC: Denies easy bruising or bleeding. SKIN: Denies rash or lesion. MUSCULOSKELETAL: Denies pain in neck, back, shoulder, knees, hips or arthritic symptoms.  NEUROLOGIC: Denies paralysis, paresthesias.  PSYCHIATRIC: Denies anxiety or depressive symptoms.   VITAL SIGNS:  Blood pressure 126/77, pulse 67, temperature 100 F (37.8 C), temperature source Oral, resp. rate 19, height 6\' 2"  (1.88 m), weight 111.9 kg, SpO2 93 %.  I/O:    Intake/Output Summary (Last 24 hours) at 10/25/2018 1138 Last data filed at 10/25/2018 1111 Gross per 24 hour  Intake 240 ml  Output 1075 ml  Net -835 ml    PHYSICAL EXAMINATION:  GENERAL:  73 y.o.-year-old patient lying in the bed with no acute distress.  EYES: Pupils equal, round, reactive to light and accommodation. No scleral icterus. Extraocular muscles intact.  HEENT: Head atraumatic,  normocephalic. Oropharynx and nasopharynx clear.  NECK:  Supple, no jugular venous distention. No thyroid enlargement, no tenderness.  LUNGS: Normal breath sounds bilaterally, no wheezing, rales,rhonchi or crepitation. No use of accessory muscles of respiration.  CARDIOVASCULAR: S1, S2 normal. No murmurs, rubs, or gallops.  ABDOMEN: Soft, non-tender, non-distended. Bowel sounds present.  EXTREMITIES: No pedal edema, cyanosis, or clubbing.  NEUROLOGIC: Cranial nerves II through XII are intact. Muscle strength 5/5 in all extremities. Sensation intact. Gait not checked.  PSYCHIATRIC: The patient is alert and oriented x 3.  SKIN: No obvious rash, lesion, or ulcer.   DATA REVIEW:   CBC Recent Labs  Lab 10/25/18 0430  WBC 9.2  HGB 10.8*  HCT 33.1*  PLT 144*    Chemistries  Recent Labs  Lab 10/22/18 0451  10/25/18 0430  NA  --    < > 136  K  --    < > 3.9  CL  --    < > 111  CO2  --    < > 18*  GLUCOSE  --    < > 116*  BUN  --    < > 22  CREATININE  --    < > 1.35*  CALCIUM  --    < > 7.7*  AST 43*  --   --   ALT 45*  --   --   ALKPHOS 43  --   --   BILITOT 1.0  --   --    < > = values in this interval not displayed.    Cardiac Enzymes Recent Labs  Lab 10/21/18 2251  TROPONINI <0.03    Microbiology Results  Results for orders placed or performed during the hospital encounter of 10/19/18  Novel Coronavirus, NAA (hospital order; send-out to ref lab)     Status: None   Collection Time: 10/19/18 11:57 AM   Specimen: Nasopharyngeal Swab; Respiratory  Result Value Ref Range Status   SARS-CoV-2, NAA NOT DETECTED NOT DETECTED Final    Comment: (NOTE) This test was developed and its performance characteristics determined by [facility]. This test has not been FDA cleared or approved. This test has been authorized by FDA under an Emergency Use Authorization (EUA). This test is only authorized for the duration of time the declaration that circumstances  exist justifying the authorization of the emergency use of in vitro diagnostic tests for detection of SARS-CoV-2 virus and/or diagnosis of COVID-19 infection under section 564(b)(1) of the Act, 21 U.S.C. 161WRU-0(A)(5360bbb-3(b)(1), unless the authorization is terminated or revoked sooner. When diagnostic testing is negative, the possibility of a false negative result should be considered in the context of a patient's recent exposures and the presence of clinical signs and symptoms consistent with COVID-19. An individual without symptoms of COVID-19 and who is not shedding SARS-CoV-2 virus would expect to have a negative (not detected) result in this assay. Performed  At: [facility] [address] [place], [place] [id number] Jolene SchimkeNagendra Sanjai MD NW:[id number]:[phone]    Coronavirus Source NASOPHARYNGEAL  Final    Comment: Performed at [facility], [address] [place], [place] [address]    RADIOLOGY:  Dg Chest 2 View  Result Date: 10/22/2018 CLINICAL DATA:  Chest pain EXAM: CHEST - 2 VIEW COMPARISON:  10/21/2018 FINDINGS: There are new, small bilateral pleural effusions and/or atelectasis appreciated on lateral view. No other airspace opacity. Redemonstrated cardiomegaly with left chest multi lead pacer. IMPRESSION: There are new, small bilateral pleural effusions and/or atelectasis appreciated on lateral view. No other airspace opacity. Redemonstrated cardiomegaly with left chest multi lead pacer. Electronically Signed   By: Lauralyn PrimesAlex  Bibbey M.D.   On: 10/22/2018 13:29   [place] Abdomen Complete  Result Date: 10/22/2018 CLINICAL DATA:  Thrombocytopenia. EXAM: ABDOMEN ULTRASOUND COMPLETE COMPARISON:  Ultrasound 10/21/2018. FINDINGS: Gallbladder: No gallstones or wall thickening visualized. No sonographic Murphy sign noted by sonographer. Common bile duct: Diameter: 5.0 mm Liver: Increased echogenicity consistent fatty infiltration or hepatocellular disease. Portal vein is patent on color Doppler  imaging with normal direction of blood flow towards the liver. IVC: No abnormality visualized. Pancreas: Visualized portion unremarkable. Spleen: Size and appearance within normal limits. Multiple small echogenic foci noted most consistent calcified granulomas. Right Kidney: Length: 11.2 cm. Echogenicity within normal limits. No mass or hydronephrosis visualized. Left Kidney: Length: 12.2 cm. Echogenicity within normal limits. No hydronephrosis visualized. 1.2 cm simple cyst. Abdominal aorta: No aneurysm visualized. Other findings: None. IMPRESSION: 1. Increased hepatic echogenicity consistent fatty infiltration or hepatocellular disease. 2.  No gallstones or biliary distention. 3.  1.2 cm simple cyst left kidney. Electronically Signed   By: Maisie Fushomas  Register   On: 10/22/2018 15:10   [place] Renal  Result Date: 10/21/2018 CLINICAL DATA:  Acute renal insufficiency EXAM: RENAL / URINARY TRACT ULTRASOUND COMPLETE COMPARISON:  None. FINDINGS: Right Kidney: Renal measurements: 11.1 x 4.8 x 5.5 cm = volume: 153 mL . Echogenicity within normal limits. No mass or hydronephrosis visualized. Left Kidney: Renal measurements: 11.6 x 5.5 x 5.3 cm = volume: 170 mL. Contains a 10 mm cyst. Bladder: Appears normal for degree of bladder distention. IMPRESSION: Small 10 mm cysts on the left kidney.  No other abnormalities. Electronically Signed   By: Gerome Samavid  Williams III M.D   On: 10/21/2018 12:41   Nm Pulmonary Perf And Vent  Result Date: 10/22/2018 CLINICAL DATA:  Chest pain. EXAM: NUCLEAR MEDICINE VENTILATION - PERFUSION LUNG SCAN TECHNIQUE: Ventilation images were obtained in multiple projections using inhaled aerosol Tc-5243m DTPA. Perfusion images were obtained in multiple projections after intravenous injection of Tc-7343m MAA. RADIOPHARMACEUTICALS:  32.589 mCi of Tc-6543m DTPA aerosol inhalation and 4.356 mCi Tc7943m MAA IV COMPARISON:  Chest x-ray dated 10/22/2018 FINDINGS: Ventilation: No focal ventilation defect. Perfusion: No  wedge shaped peripheral perfusion defects to suggest acute pulmonary embolism. IMPRESSION: Normal ventilation perfusion lung scan. No evidence of pulmonary embolism. Electronically Signed   By: Francene BoyersJames  Maxwell M.D.   On: 10/22/2018 14:34    EKG:   Orders placed or performed during the hospital encounter of 10/20/18  . ED EKG  . ED EKG      Management plans discussed with the patient, he is in agreement.  Daughter was updated yesterday.  Patient endorsed that I do not have to update the daughter today as she got all information  CODE STATUS:     Code Status Orders  (From admission, onward)         Start     Ordered   10/21/18 0445  Full code  Continuous     10/21/18 0444        Code Status History    Date Active Date Inactive Code Status Order ID Comments User Context   03/22/2016 1448 03/23/2016 1328 Full Code [id number]  Marcina MillardParaschos, Alexander, MD Inpatient   Advance Care Planning Activity    Advance Directive Documentation     Most Recent Value  Type of Advance Directive  Healthcare Power of Attorney, Living will  Pre-existing out of facility DNR order (yellow form or pink MOST form)  -  "MOST" Form in Place?  -      TOTAL TIME TAKING CARE OF THIS PATIENT: 45  minutes.   Note: This dictation was prepared with Dragon dictation along with smaller phrase technology. Any transcriptional errors that result from this process are unintentional.   @M[facility] @  on 10/25/2018 at 11:38 AM  Between 7am to 6pm - Pager - [phone]  After 6pm go to [email or web address] - password [email or web address] [facility] [facility]  Office  [phone]  CC: Primary care physician; Danella PentonMiller, Mark F, MD

## 2018-10-25 NOTE — Progress Notes (Signed)
Iv and tele removed from patient. Discharge instructions given to patient. Verbalized understanding. No acute distress at this time. Wife to transport patient home.

## 2018-10-27 LAB — METHYLMALONIC ACID, SERUM: Methylmalonic Acid, Quantitative: 131 nmol/L (ref 0–378)

## 2018-11-05 MED ORDER — ALLOPURINOL 100 MG PO TABS
300.00 | ORAL_TABLET | ORAL | Status: DC
Start: 2018-11-10 — End: 2018-11-05

## 2018-11-05 MED ORDER — CYANOCOBALAMIN 1000 MCG/ML IJ SOLN
1000.00 | INTRAMUSCULAR | Status: DC
Start: 2018-11-12 — End: 2018-11-05

## 2018-11-05 MED ORDER — LACTATED RINGERS IV SOLN
INTRAVENOUS | Status: DC
Start: ? — End: 2018-11-05

## 2018-11-05 MED ORDER — LISINOPRIL 5 MG PO TABS
5.00 | ORAL_TABLET | ORAL | Status: DC
Start: 2018-11-06 — End: 2018-11-05

## 2018-11-05 MED ORDER — DOCUSATE SODIUM 100 MG PO CAPS
100.00 | ORAL_CAPSULE | ORAL | Status: DC
Start: 2018-11-05 — End: 2018-11-05

## 2018-11-05 MED ORDER — ACETAMINOPHEN 325 MG PO TABS
650.00 | ORAL_TABLET | ORAL | Status: DC
Start: ? — End: 2018-11-05

## 2018-11-05 MED ORDER — SPIRONOLACTONE 25 MG PO TABS
25.00 | ORAL_TABLET | ORAL | Status: DC
Start: 2018-11-10 — End: 2018-11-05

## 2018-11-05 MED ORDER — CEFAZOLIN SODIUM-DEXTROSE 2-3 GM-%(50ML) IV SOLR
2.00 | INTRAVENOUS | Status: DC
Start: 2018-11-09 — End: 2018-11-05

## 2018-11-05 MED ORDER — MAGNESIUM OXIDE 400 MG PO TABS
400.00 | ORAL_TABLET | ORAL | Status: DC
Start: 2018-11-10 — End: 2018-11-05

## 2018-11-05 MED ORDER — ROSUVASTATIN CALCIUM 20 MG PO TABS
20.00 | ORAL_TABLET | ORAL | Status: DC
Start: 2018-11-09 — End: 2018-11-05

## 2018-11-05 MED ORDER — LIDOCAINE HCL 1 % IJ SOLN
0.50 | INTRAMUSCULAR | Status: DC
Start: ? — End: 2018-11-05

## 2018-11-05 MED ORDER — ASPIRIN 81 MG PO CHEW
81.00 | CHEWABLE_TABLET | ORAL | Status: DC
Start: 2018-11-06 — End: 2018-11-05

## 2018-11-05 MED ORDER — ISOSORBIDE MONONITRATE ER 30 MG PO TB24
30.00 | ORAL_TABLET | ORAL | Status: DC
Start: 2018-11-10 — End: 2018-11-05

## 2018-11-05 MED ORDER — PANTOPRAZOLE SODIUM 20 MG PO TBEC
20.00 | DELAYED_RELEASE_TABLET | ORAL | Status: DC
Start: 2018-11-09 — End: 2018-11-05

## 2018-11-05 MED ORDER — MELATONIN 3 MG PO TABS
3.00 | ORAL_TABLET | ORAL | Status: DC
Start: 2018-11-09 — End: 2018-11-05

## 2018-11-05 MED ORDER — HEPARIN (PORCINE) IN NACL 25000-0.45 UT/250ML-% IV SOLN
1800.00 | INTRAVENOUS | Status: DC
Start: ? — End: 2018-11-05

## 2018-11-05 MED ORDER — AMIODARONE HCL 200 MG PO TABS
100.00 | ORAL_TABLET | ORAL | Status: DC
Start: 2018-11-10 — End: 2018-11-05

## 2018-11-05 MED ORDER — GENERIC EXTERNAL MEDICATION
1.00 | Status: DC
Start: 2018-11-06 — End: 2018-11-05

## 2018-11-05 MED ORDER — COLCHICINE 0.6 MG PO TABS
.30 | ORAL_TABLET | ORAL | Status: DC
Start: 2018-11-10 — End: 2018-11-05

## 2018-11-09 MED ORDER — LOSARTAN POTASSIUM 25 MG PO TABS
25.00 | ORAL_TABLET | ORAL | Status: DC
Start: 2018-11-10 — End: 2018-11-09

## 2018-11-09 MED ORDER — APIXABAN 5 MG PO TABS
5.00 | ORAL_TABLET | ORAL | Status: DC
Start: 2018-11-09 — End: 2018-11-09

## 2018-11-09 MED ORDER — GENERIC EXTERNAL MEDICATION
1.00 | Status: DC
Start: ? — End: 2018-11-09

## 2018-11-30 ENCOUNTER — Ambulatory Visit: Payer: Medicare HMO | Admitting: Gastroenterology

## 2019-05-05 DIAGNOSIS — I495 Sick sinus syndrome: Secondary | ICD-10-CM | POA: Diagnosis present

## 2019-05-13 ENCOUNTER — Other Ambulatory Visit: Payer: Self-pay | Admitting: Internal Medicine

## 2019-05-13 DIAGNOSIS — N159 Renal tubulo-interstitial disease, unspecified: Secondary | ICD-10-CM

## 2019-05-17 ENCOUNTER — Other Ambulatory Visit: Payer: Self-pay

## 2019-05-17 ENCOUNTER — Ambulatory Visit
Admission: RE | Admit: 2019-05-17 | Discharge: 2019-05-17 | Disposition: A | Discharge status: Home / Self Care | Payer: Medicare HMO | Source: Ambulatory Visit | Attending: Internal Medicine | Admitting: Internal Medicine

## 2019-05-17 DIAGNOSIS — N1832 Chronic kidney disease, stage 3b: Secondary | ICD-10-CM | POA: Diagnosis present

## 2019-05-17 DIAGNOSIS — I420 Dilated cardiomyopathy: Secondary | ICD-10-CM | POA: Diagnosis present

## 2019-05-17 DIAGNOSIS — N159 Renal tubulo-interstitial disease, unspecified: Secondary | ICD-10-CM

## 2019-12-08 DIAGNOSIS — S62109A Fracture of unspecified carpal bone, unspecified wrist, initial encounter for closed fracture: Secondary | ICD-10-CM

## 2019-12-08 HISTORY — DX: Fracture of unspecified carpal bone, unspecified wrist, initial encounter for closed fracture: S62.109A

## 2019-12-17 ENCOUNTER — Other Ambulatory Visit: Payer: Self-pay

## 2019-12-17 ENCOUNTER — Encounter: Payer: Self-pay | Admitting: Ophthalmology

## 2019-12-19 ENCOUNTER — Other Ambulatory Visit: Admission: RE | Admit: 2019-12-19 | Payer: Medicare HMO | Source: Ambulatory Visit

## 2019-12-19 NOTE — Discharge Instructions (Signed)

## 2019-12-20 ENCOUNTER — Other Ambulatory Visit: Payer: Self-pay

## 2019-12-20 ENCOUNTER — Other Ambulatory Visit
Admission: RE | Admit: 2019-12-20 | Discharge: 2019-12-20 | Disposition: A | Discharge status: Home / Self Care | Payer: Medicare HMO | Source: Ambulatory Visit | Attending: Ophthalmology | Admitting: Ophthalmology

## 2019-12-20 DIAGNOSIS — Z20822 Contact with and (suspected) exposure to covid-19: Secondary | ICD-10-CM | POA: Insufficient documentation

## 2019-12-20 DIAGNOSIS — Z01812 Encounter for preprocedural laboratory examination: Secondary | ICD-10-CM | POA: Insufficient documentation

## 2019-12-20 LAB — SARS CORONAVIRUS 2 (TAT 6-24 HRS): SARS Coronavirus 2: NEGATIVE

## 2019-12-20 NOTE — Anesthesia Preprocedure Evaluation (Addendum)
Anesthesia Evaluation  Patient identified by MRN, date of birth, ID band Patient awake    Reviewed: Allergy & Precautions, NPO status , Patient's Chart, lab work & pertinent test results, reviewed documented beta blocker date and time   History of Anesthesia Complications Negative for: history of anesthetic complications  Airway Mallampati: III  TM Distance: >3 FB Neck ROM: Limited    Dental   Pulmonary former smoker,    breath sounds clear to auscultation       Cardiovascular hypertension, (-) angina+ CAD, + Past MI (NSTEMI 10/2018) and +CHF  (-) DOE + dysrhythmias (Sick sinus syndrome) Atrial Fibrillation + pacemaker (S/p Micra pacemaker 2020)  Rhythm:Regular Rate:Normal   HLD  TTE (03/2019): LVEF 50%, normal wall motion, and moderate LVH (SWT 1.7, PWT 1.4cm), LA diam 4.9 cm.   Neuro/Psych    GI/Hepatic neg GERD  ,  Endo/Other    Renal/GU      Musculoskeletal   Abdominal (+) + obese (BMI 30),   Peds  Hematology   Anesthesia Other Findings Gout  Reproductive/Obstetrics                            Anesthesia Physical Anesthesia Plan  ASA: III  Anesthesia Plan: MAC   Post-op Pain Management:    Induction: Intravenous  PONV Risk Score and Plan: 1 and TIVA, Midazolam and Treatment may vary due to age or medical condition  Airway Management Planned: Nasal Cannula  Additional Equipment:   Intra-op Plan:   Post-operative Plan:   Informed Consent: I have reviewed the patients History and Physical, chart, labs and discussed the procedure including the risks, benefits and alternatives for the proposed anesthesia with the patient or authorized representative who has indicated his/her understanding and acceptance.       Plan Discussed with: CRNA and Anesthesiologist  Anesthesia Plan Comments:         Anesthesia Quick Evaluation

## 2019-12-23 ENCOUNTER — Other Ambulatory Visit: Payer: Self-pay

## 2019-12-23 ENCOUNTER — Ambulatory Visit: Payer: Medicare HMO | Admitting: Anesthesiology

## 2019-12-23 ENCOUNTER — Encounter
Admission: RE | Disposition: A | Discharge status: Home / Self Care | Payer: Self-pay | Source: Home / Self Care | Attending: Ophthalmology

## 2019-12-23 ENCOUNTER — Encounter: Payer: Self-pay | Admitting: Ophthalmology

## 2019-12-23 ENCOUNTER — Ambulatory Visit
Admission: RE | Admit: 2019-12-23 | Discharge: 2019-12-23 | Disposition: A | Discharge status: Home / Self Care | Payer: Medicare HMO | Attending: Ophthalmology | Admitting: Ophthalmology

## 2019-12-23 DIAGNOSIS — Z950 Presence of cardiac pacemaker: Secondary | ICD-10-CM | POA: Insufficient documentation

## 2019-12-23 DIAGNOSIS — I495 Sick sinus syndrome: Secondary | ICD-10-CM | POA: Diagnosis not present

## 2019-12-23 DIAGNOSIS — Z79899 Other long term (current) drug therapy: Secondary | ICD-10-CM | POA: Insufficient documentation

## 2019-12-23 DIAGNOSIS — I509 Heart failure, unspecified: Secondary | ICD-10-CM | POA: Insufficient documentation

## 2019-12-23 DIAGNOSIS — I4891 Unspecified atrial fibrillation: Secondary | ICD-10-CM | POA: Diagnosis not present

## 2019-12-23 DIAGNOSIS — I2510 Atherosclerotic heart disease of native coronary artery without angina pectoris: Secondary | ICD-10-CM | POA: Diagnosis not present

## 2019-12-23 DIAGNOSIS — Z87891 Personal history of nicotine dependence: Secondary | ICD-10-CM | POA: Diagnosis not present

## 2019-12-23 DIAGNOSIS — I110 Hypertensive heart disease with heart failure: Secondary | ICD-10-CM | POA: Diagnosis not present

## 2019-12-23 DIAGNOSIS — H2511 Age-related nuclear cataract, right eye: Secondary | ICD-10-CM | POA: Diagnosis not present

## 2019-12-23 DIAGNOSIS — E785 Hyperlipidemia, unspecified: Secondary | ICD-10-CM | POA: Insufficient documentation

## 2019-12-23 DIAGNOSIS — M109 Gout, unspecified: Secondary | ICD-10-CM | POA: Diagnosis not present

## 2019-12-23 DIAGNOSIS — E7800 Pure hypercholesterolemia, unspecified: Secondary | ICD-10-CM | POA: Insufficient documentation

## 2019-12-23 DIAGNOSIS — Z7901 Long term (current) use of anticoagulants: Secondary | ICD-10-CM | POA: Insufficient documentation

## 2019-12-23 HISTORY — DX: Presence of cardiac pacemaker: Z95.0

## 2019-12-23 HISTORY — PX: CATARACT EXTRACTION W/PHACO: SHX586

## 2019-12-23 SURGERY — PHACOEMULSIFICATION, CATARACT, WITH IOL INSERTION
Anesthesia: Monitor Anesthesia Care | Site: Eye | Laterality: Right

## 2019-12-23 MED ORDER — SODIUM HYALURONATE 23 MG/ML IO SOLN
INTRAOCULAR | Status: DC | PRN
  Administered 2019-12-23: 0.6 mL via INTRAOCULAR

## 2019-12-23 MED ORDER — MIDAZOLAM HCL 2 MG/2ML IJ SOLN
INTRAMUSCULAR | Status: DC | PRN
  Administered 2019-12-23: 1 mg via INTRAVENOUS

## 2019-12-23 MED ORDER — ARMC OPHTHALMIC DILATING DROPS
1.0000 "application " | OPHTHALMIC | Status: DC | PRN
  Administered 2019-12-23 (×3): 1 via OPHTHALMIC

## 2019-12-23 MED ORDER — SODIUM HYALURONATE 10 MG/ML IO SOLN
INTRAOCULAR | Status: DC | PRN
  Administered 2019-12-23: 0.55 mL via INTRAOCULAR

## 2019-12-23 MED ORDER — ONDANSETRON HCL 4 MG/2ML IJ SOLN: 4.0000 mg | Freq: Once | INTRAMUSCULAR | Status: DC | PRN

## 2019-12-23 MED ORDER — MOXIFLOXACIN HCL 0.5 % OP SOLN
OPHTHALMIC | Status: DC | PRN
  Administered 2019-12-23: 0.2 mL via OPHTHALMIC

## 2019-12-23 MED ORDER — LACTATED RINGERS IV SOLN: INTRAVENOUS | Status: DC

## 2019-12-23 MED ORDER — ACETAMINOPHEN 10 MG/ML IV SOLN: 1000.0000 mg | Freq: Once | INTRAVENOUS | Status: DC | PRN

## 2019-12-23 MED ORDER — LIDOCAINE HCL (PF) 2 % IJ SOLN
INTRAOCULAR | Status: DC | PRN
  Administered 2019-12-23: 1 mL via INTRAOCULAR

## 2019-12-23 MED ORDER — FENTANYL CITRATE (PF) 100 MCG/2ML IJ SOLN
INTRAMUSCULAR | Status: DC | PRN
  Administered 2019-12-23: 50 ug via INTRAVENOUS

## 2019-12-23 MED ORDER — TETRACAINE HCL 0.5 % OP SOLN
1.0000 [drp] | OPHTHALMIC | Status: DC | PRN
  Administered 2019-12-23 (×3): 1 [drp] via OPHTHALMIC

## 2019-12-23 MED ORDER — EPINEPHRINE PF 1 MG/ML IJ SOLN
INTRAOCULAR | Status: DC | PRN
  Administered 2019-12-23: 103 mL via OPHTHALMIC

## 2019-12-23 SURGICAL SUPPLY — 20 items
CANNULA ANT/CHMB 27G (MISCELLANEOUS) ×2 IMPLANT
CANNULA ANT/CHMB 27GA (MISCELLANEOUS) ×4 IMPLANT
DISSECTOR HYDRO NUCLEUS 50X22 (MISCELLANEOUS) ×2 IMPLANT
GLOVE SURG LX 7.5 STRW (GLOVE) ×2
GLOVE SURG LX STRL 7.5 STRW (GLOVE) ×1 IMPLANT
GLOVE SURG SYN 8.5  E (GLOVE) ×1
GLOVE SURG SYN 8.5 E (GLOVE) ×1 IMPLANT
GLOVE SURG SYN 8.5 PF PI (GLOVE) ×1 IMPLANT
GOWN STRL REUS W/ TWL LRG LVL3 (GOWN DISPOSABLE) ×2 IMPLANT
GOWN STRL REUS W/TWL LRG LVL3 (GOWN DISPOSABLE) ×4
LENS IOL ACRSF IQ PAN 13.0 IMPLANT
LENS IOL IQ PANOPTIX 13.0 ×2 IMPLANT
MARKER SKIN DUAL TIP RULER LAB (MISCELLANEOUS) ×2 IMPLANT
PACK DR. KING ARMS (PACKS) ×2 IMPLANT
PACK EYE AFTER SURG (MISCELLANEOUS) ×2 IMPLANT
PACK OPTHALMIC (MISCELLANEOUS) ×2 IMPLANT
SYR 3ML LL SCALE MARK (SYRINGE) ×2 IMPLANT
SYR TB 1ML LUER SLIP (SYRINGE) ×2 IMPLANT
WATER STERILE IRR 250ML POUR (IV SOLUTION) ×2 IMPLANT
WIPE NON LINTING 3.25X3.25 (MISCELLANEOUS) ×2 IMPLANT

## 2019-12-23 NOTE — Anesthesia Postprocedure Evaluation (Signed)
Anesthesia Post Note  Patient: Mike Townsend  Procedure(s) Performed: CATARACT EXTRACTION PHACO AND INTRAOCULAR LENS PLACEMENT (IOC) RIGHT  PANOPTIC TORIC LENS (Right Eye)     Patient location during evaluation: PACU Anesthesia Type: MAC Level of consciousness: awake and alert Pain management: pain level controlled Vital Signs Assessment: post-procedure vital signs reviewed and stable Respiratory status: spontaneous breathing, nonlabored ventilation, respiratory function stable and patient connected to nasal cannula oxygen Cardiovascular status: stable and blood pressure returned to baseline Postop Assessment: no apparent nausea or vomiting Anesthetic complications: no   No complications documented.  Dayden Viverette A  Wessley Emert

## 2019-12-23 NOTE — Transfer of Care (Signed)
Immediate Anesthesia Transfer of Care Note  Patient: Mike Townsend  Procedure(s) Performed: CATARACT EXTRACTION PHACO AND INTRAOCULAR LENS PLACEMENT (IOC) RIGHT  PANOPTIC TORIC LENS (Right Eye)  Patient Location: PACU  Anesthesia Type: MAC  Level of Consciousness: awake, alert  and patient cooperative  Airway and Oxygen Therapy: Patient Spontanous Breathing and Patient connected to supplemental oxygen  Post-op Assessment: Post-op Vital signs reviewed, Patient's Cardiovascular Status Stable, Respiratory Function Stable, Patent Airway and No signs of Nausea or vomiting  Post-op Vital Signs: Reviewed and stable  Complications: No complications documented.

## 2019-12-23 NOTE — H&P (Signed)

## 2019-12-23 NOTE — Anesthesia Procedure Notes (Signed)
Procedure Name: MAC Date/Time: 12/23/2019 11:52 AM Performed by: Silvana Newness, CRNA Pre-anesthesia Checklist: Patient identified, Emergency Drugs available, Suction available, Patient being monitored and Timeout performed Patient Re-evaluated:Patient Re-evaluated prior to induction Oxygen Delivery Method: Nasal cannula Placement Confirmation: positive ETCO2

## 2019-12-23 NOTE — Op Note (Signed)
OPERATIVE NOTE  Attilio Zeitler [id number] 12/23/2019   PREOPERATIVE DIAGNOSIS:  Nuclear sclerotic cataract right eye.  H25.11   POSTOPERATIVE DIAGNOSIS:    Nuclear sclerotic cataract right eye.     PROCEDURE:  Phacoemusification with posterior chamber intraocular lens placement of the right eye   LENS:   Implant Name Type Inv. Item Serial No. Manufacturer Lot No. LRB No. Used Action  ALCON ACRYSOF PANOPTIX IOL Intraocular Lens  [id number] ALCON  Right 1 Implanted       Procedure(s) with comments: CATARACT EXTRACTION PHACO AND INTRAOCULAR LENS PLACEMENT (IOC) RIGHT  PANOPTIC TORIC LENS (Right) - 4.70 0:34.7  TFNT00 +13.0   ULTRASOUND TIME: 0 minutes 34 seconds.  CDE 4.70   SURGEON:  Willey Blade, MD, MPH  ANESTHESIOLOGIST: Anesthesiologist: Heniser, Burman Foster, MD CRNA: Michaele Offer, CRNA   ANESTHESIA:  Topical with tetracaine drops augmented with 1% preservative-free intracameral lidocaine.  ESTIMATED BLOOD LOSS: less than 1 mL.   COMPLICATIONS:  None.   DESCRIPTION OF PROCEDURE:  The patient was identified in the holding room and transported to the operating room and placed in the supine position under the operating microscope.  The right eye was identified as the operative eye and it was prepped and draped in the usual sterile ophthalmic fashion.   A 1.0 millimeter clear-corneal paracentesis was made at the 10:30 position. 0.5 ml of preservative-free 1% lidocaine with epinephrine was injected into the anterior chamber.  The anterior chamber was filled with Healon 5 viscoelastic.  A 2.4 millimeter keratome was used to make a near-clear corneal incision at the 8:00 position.  A curvilinear capsulorrhexis was made with a cystotome and capsulorrhexis forceps.  Balanced salt solution was used to hydrodissect and hydrodelineate the nucleus.   Phacoemulsification was then used in stop and chop fashion to remove the lens nucleus and epinucleus.  The remaining cortex was then removed  using the irrigation and aspiration handpiece. Healon was then placed into the capsular bag to distend it for lens placement.  A lens was then injected into the capsular bag.  The remaining viscoelastic was aspirated.  The lens was well centered.   Wounds were hydrated with balanced salt solution.  The anterior chamber was inflated to a physiologic pressure with balanced salt solution.   Intracameral vigamox 0.1 mL undiluted was injected into the eye and a drop placed onto the ocular surface.  No wound leaks were noted.  The patient was taken to the recovery room in stable condition without complications of anesthesia or surgery  Willey Blade 12/23/2019, 12:11 PM

## 2019-12-24 ENCOUNTER — Encounter: Payer: Self-pay | Admitting: Ophthalmology

## 2020-01-09 ENCOUNTER — Encounter: Payer: Self-pay | Admitting: Ophthalmology

## 2020-01-09 ENCOUNTER — Other Ambulatory Visit: Payer: Self-pay

## 2020-01-16 ENCOUNTER — Other Ambulatory Visit: Payer: Self-pay

## 2020-01-16 ENCOUNTER — Other Ambulatory Visit
Admission: RE | Admit: 2020-01-16 | Discharge: 2020-01-16 | Disposition: A | Discharge status: Home / Self Care | Payer: Medicare HMO | Source: Ambulatory Visit | Attending: Ophthalmology | Admitting: Ophthalmology

## 2020-01-16 DIAGNOSIS — Z20822 Contact with and (suspected) exposure to covid-19: Secondary | ICD-10-CM | POA: Insufficient documentation

## 2020-01-16 DIAGNOSIS — Z01812 Encounter for preprocedural laboratory examination: Secondary | ICD-10-CM | POA: Insufficient documentation

## 2020-01-16 NOTE — Discharge Instructions (Signed)

## 2020-01-17 LAB — SARS CORONAVIRUS 2 (TAT 6-24 HRS): SARS Coronavirus 2: NEGATIVE

## 2020-01-20 ENCOUNTER — Ambulatory Visit: Payer: Medicare HMO | Admitting: Anesthesiology

## 2020-01-20 ENCOUNTER — Encounter
Admission: RE | Disposition: A | Discharge status: Home / Self Care | Payer: Self-pay | Source: Home / Self Care | Attending: Ophthalmology

## 2020-01-20 ENCOUNTER — Encounter: Payer: Self-pay | Admitting: Ophthalmology

## 2020-01-20 ENCOUNTER — Other Ambulatory Visit: Payer: Self-pay

## 2020-01-20 ENCOUNTER — Ambulatory Visit
Admission: RE | Admit: 2020-01-20 | Discharge: 2020-01-20 | Disposition: A | Discharge status: Home / Self Care | Payer: Medicare HMO | Attending: Ophthalmology | Admitting: Ophthalmology

## 2020-01-20 DIAGNOSIS — E7800 Pure hypercholesterolemia, unspecified: Secondary | ICD-10-CM | POA: Diagnosis not present

## 2020-01-20 DIAGNOSIS — Z7901 Long term (current) use of anticoagulants: Secondary | ICD-10-CM | POA: Diagnosis not present

## 2020-01-20 DIAGNOSIS — I4891 Unspecified atrial fibrillation: Secondary | ICD-10-CM | POA: Insufficient documentation

## 2020-01-20 DIAGNOSIS — I110 Hypertensive heart disease with heart failure: Secondary | ICD-10-CM | POA: Insufficient documentation

## 2020-01-20 DIAGNOSIS — Z9841 Cataract extraction status, right eye: Secondary | ICD-10-CM | POA: Diagnosis not present

## 2020-01-20 DIAGNOSIS — M109 Gout, unspecified: Secondary | ICD-10-CM | POA: Insufficient documentation

## 2020-01-20 DIAGNOSIS — I509 Heart failure, unspecified: Secondary | ICD-10-CM | POA: Diagnosis not present

## 2020-01-20 DIAGNOSIS — Z79899 Other long term (current) drug therapy: Secondary | ICD-10-CM | POA: Diagnosis not present

## 2020-01-20 DIAGNOSIS — H2512 Age-related nuclear cataract, left eye: Secondary | ICD-10-CM | POA: Insufficient documentation

## 2020-01-20 DIAGNOSIS — Z950 Presence of cardiac pacemaker: Secondary | ICD-10-CM | POA: Diagnosis not present

## 2020-01-20 HISTORY — PX: CATARACT EXTRACTION W/PHACO: SHX586

## 2020-01-20 HISTORY — DX: Heart failure, unspecified: I50.9

## 2020-01-20 HISTORY — DX: Personal history of other medical treatment: Z92.89

## 2020-01-20 HISTORY — DX: Cardiac arrhythmia, unspecified: I49.9

## 2020-01-20 SURGERY — PHACOEMULSIFICATION, CATARACT, WITH IOL INSERTION
Anesthesia: Monitor Anesthesia Care | Site: Eye | Laterality: Left

## 2020-01-20 MED ORDER — EPINEPHRINE PF 1 MG/ML IJ SOLN
INTRAOCULAR | Status: DC | PRN
  Administered 2020-01-20: 170 mL via OPHTHALMIC

## 2020-01-20 MED ORDER — LACTATED RINGERS IV SOLN: INTRAVENOUS | Status: DC

## 2020-01-20 MED ORDER — TETRACAINE HCL 0.5 % OP SOLN
1.0000 [drp] | OPHTHALMIC | Status: DC | PRN
  Administered 2020-01-20 (×3): 1 [drp] via OPHTHALMIC

## 2020-01-20 MED ORDER — LIDOCAINE HCL (PF) 2 % IJ SOLN
INTRAOCULAR | Status: DC | PRN
  Administered 2020-01-20: 1 mL via INTRAOCULAR

## 2020-01-20 MED ORDER — MIDAZOLAM HCL 2 MG/2ML IJ SOLN
INTRAMUSCULAR | Status: DC | PRN
  Administered 2020-01-20: 2 mg via INTRAVENOUS

## 2020-01-20 MED ORDER — FENTANYL CITRATE (PF) 100 MCG/2ML IJ SOLN
INTRAMUSCULAR | Status: DC | PRN
Start: 2020-01-20 — End: 2020-01-20
  Administered 2020-01-20 (×2): 50 ug via INTRAVENOUS

## 2020-01-20 MED ORDER — MOXIFLOXACIN HCL 0.5 % OP SOLN
OPHTHALMIC | Status: DC | PRN
  Administered 2020-01-20: 0.2 mL via OPHTHALMIC

## 2020-01-20 MED ORDER — ARMC OPHTHALMIC DILATING DROPS
1.0000 "application " | OPHTHALMIC | Status: DC | PRN
  Administered 2020-01-20 (×3): 1 via OPHTHALMIC

## 2020-01-20 MED ORDER — SODIUM HYALURONATE 23 MG/ML IO SOLN
INTRAOCULAR | Status: DC | PRN
  Administered 2020-01-20: 0.6 mL via INTRAOCULAR

## 2020-01-20 MED ORDER — SODIUM HYALURONATE 10 MG/ML IO SOLN
INTRAOCULAR | Status: DC | PRN
  Administered 2020-01-20: 0.55 mL via INTRAOCULAR

## 2020-01-20 SURGICAL SUPPLY — 18 items
CANNULA ANT/CHMB 27GA (MISCELLANEOUS) ×4 IMPLANT
DISSECTOR HYDRO NUCLEUS 50X22 (MISCELLANEOUS) ×2 IMPLANT
GLOVE SURG LX 7.5 STRW (GLOVE) ×1
GLOVE SURG LX STRL 7.5 STRW (GLOVE) ×1 IMPLANT
GLOVE SURG SYN 8.5  E (GLOVE) ×1
GLOVE SURG SYN 8.5 E (GLOVE) ×1 IMPLANT
GOWN STRL REUS W/ TWL LRG LVL3 (GOWN DISPOSABLE) ×2 IMPLANT
GOWN STRL REUS W/TWL LRG LVL3 (GOWN DISPOSABLE) ×4
LENS IOL ACRSF IQ PAN 13.0 ×1 IMPLANT
LENS IOL IQ PANOPTIX 13.0 ×2 IMPLANT
MARKER SKIN DUAL TIP RULER LAB (MISCELLANEOUS) ×2 IMPLANT
PACK DR. KING ARMS (PACKS) ×2 IMPLANT
PACK EYE AFTER SURG (MISCELLANEOUS) ×2 IMPLANT
PACK OPTHALMIC (MISCELLANEOUS) ×2 IMPLANT
SYR 3ML LL SCALE MARK (SYRINGE) ×2 IMPLANT
SYR TB 1ML LUER SLIP (SYRINGE) ×2 IMPLANT
WATER STERILE IRR 250ML POUR (IV SOLUTION) ×2 IMPLANT
WIPE NON LINTING 3.25X3.25 (MISCELLANEOUS) ×2 IMPLANT

## 2020-01-20 NOTE — Anesthesia Preprocedure Evaluation (Signed)
Anesthesia Evaluation  Patient identified by MRN, date of birth, ID band Patient awake    Reviewed: Allergy & Precautions, H&P , NPO status , Patient's Chart, lab work & pertinent test results  History of Anesthesia Complications Negative for: history of anesthetic complications  Airway Mallampati: I  TM Distance: >3 FB Neck ROM: full    Dental no notable dental hx.    Pulmonary neg pulmonary ROS, former smoker,    Pulmonary exam normal        Cardiovascular hypertension, On Medications + dysrhythmias Atrial Fibrillation + pacemaker  Rhythm:regular Rate:Normal     Neuro/Psych negative neurological ROS     GI/Hepatic negative GI ROS, Neg liver ROS,   Endo/Other  negative endocrine ROS  Renal/GU negative Renal ROS  negative genitourinary   Musculoskeletal   Abdominal   Peds  Hematology negative hematology ROS (+)   Anesthesia Other Findings   Reproductive/Obstetrics                             Anesthesia Physical Anesthesia Plan  ASA: III  Anesthesia Plan: MAC   Post-op Pain Management:    Induction:   PONV Risk Score and Plan:   Airway Management Planned:   Additional Equipment:   Intra-op Plan:   Post-operative Plan:   Informed Consent: I have reviewed the patients History and Physical, chart, labs and discussed the procedure including the risks, benefits and alternatives for the proposed anesthesia with the patient or authorized representative who has indicated his/her understanding and acceptance.       Plan Discussed with:   Anesthesia Plan Comments:         Anesthesia Quick Evaluation

## 2020-01-20 NOTE — Op Note (Signed)
OPERATIVE NOTE  Mike Townsend [id number] 01/20/2020   PREOPERATIVE DIAGNOSIS:  Nuclear sclerotic cataract left eye.  H25.12   POSTOPERATIVE DIAGNOSIS:    Nuclear sclerotic cataract left eye.     PROCEDURE:  Phacoemusification with posterior chamber intraocular lens placement of the left eye   LENS:   Implant Name Type Inv. Item Serial No. Manufacturer Lot No. LRB No. Used Action  ALCON ACRYSOF IQ PANOPTIX IOL Intraocular Lens  [id number] ALCON  Left 1 Implanted      Procedure(s) with comments: CATARACT EXTRACTION PHACO AND INTRAOCULAR LENS PLACEMENT (IOC) LEFT PANOPTIX LENS (Left) - 10.12 1:14  TFNT00 +13.0   ULTRASOUND TIME: 1 minutes 14 seconds.  CDE 10.12   SURGEON:  Willey Blade, MD, MPH   ANESTHESIA:  Topical with tetracaine drops augmented with 1% preservative-free intracameral lidocaine.  ESTIMATED BLOOD LOSS: <1 mL   COMPLICATIONS:  None.   DESCRIPTION OF PROCEDURE:  The patient was identified in the holding room and transported to the operating room and placed in the supine position under the operating microscope.  The left eye was identified as the operative eye and it was prepped and draped in the usual sterile ophthalmic fashion.   A 1.0 millimeter clear-corneal paracentesis was made at the 5:00 position. 0.5 ml of preservative-free 1% lidocaine with epinephrine was injected into the anterior chamber.  The anterior chamber was filled with Healon 5 viscoelastic.  A 2.4 millimeter keratome was used to make a near-clear corneal incision at the 2:00 position.  A curvilinear capsulorrhexis was made with a cystotome and capsulorrhexis forceps.  Balanced salt solution was used to hydrodissect and hydrodelineate the nucleus.   Phacoemulsification was then used in stop and chop fashion to remove the lens nucleus and epinucleus.  The remaining cortex was then removed using the irrigation and aspiration handpiece. Healon was then placed into the capsular bag to distend it for  lens placement.  A lens was then injected into the capsular bag.  The remaining viscoelastic was aspirated.   Wounds were hydrated with balanced salt solution.  The anterior chamber was inflated to a physiologic pressure with balanced salt solution.  The cortex had sticky quality to it and additional I/A was performed to ensure all cortex was removed.   Intracameral vigamox 0.1 mL undiltued was injected into the eye and a drop placed onto the ocular surface.  No wound leaks were noted.  The patient was taken to the recovery room in stable condition without complications of anesthesia or surgery  Willey Blade 01/20/2020, 10:53 AM

## 2020-01-20 NOTE — H&P (Signed)

## 2020-01-20 NOTE — Anesthesia Procedure Notes (Signed)
Procedure Name: MAC Date/Time: 01/20/2020 10:29 AM Performed by: Jeannene Patella, CRNA Pre-anesthesia Checklist: Patient identified, Emergency Drugs available, Suction available, Timeout performed and Patient being monitored Patient Re-evaluated:Patient Re-evaluated prior to induction Oxygen Delivery Method: Nasal cannula Placement Confirmation: positive ETCO2

## 2020-01-20 NOTE — Anesthesia Postprocedure Evaluation (Signed)
Anesthesia Post Note  Patient: Mike Townsend  Procedure(s) Performed: CATARACT EXTRACTION PHACO AND INTRAOCULAR LENS PLACEMENT (IOC) LEFT PANOPTIX LENS (Left Eye)     Patient location during evaluation: PACU Anesthesia Type: MAC Level of consciousness: awake and alert Pain management: pain level controlled Vital Signs Assessment: post-procedure vital signs reviewed and stable Respiratory status: spontaneous breathing Cardiovascular status: stable Postop Assessment: no headache Anesthetic complications: no   No complications documented.  Gillian Scarce

## 2020-01-20 NOTE — Transfer of Care (Signed)
Immediate Anesthesia Transfer of Care Note  Patient: Mike Townsend  Procedure(s) Performed: CATARACT EXTRACTION PHACO AND INTRAOCULAR LENS PLACEMENT (IOC) LEFT PANOPTIX LENS (Left Eye)  Patient Location: PACU  Anesthesia Type: MAC  Level of Consciousness: awake, alert  and patient cooperative  Airway and Oxygen Therapy: Patient Spontanous Breathing and Patient connected to supplemental oxygen  Post-op Assessment: Post-op Vital signs reviewed, Patient's Cardiovascular Status Stable, Respiratory Function Stable, Patent Airway and No signs of Nausea or vomiting  Post-op Vital Signs: Reviewed and stable  Complications: No complications documented.

## 2020-01-21 ENCOUNTER — Encounter: Payer: Self-pay | Admitting: Ophthalmology

## 2020-09-05 LAB — COLOGUARD: COLOGUARD: NEGATIVE

## 2020-09-05 LAB — EXTERNAL GENERIC LAB PROCEDURE: COLOGUARD: NEGATIVE

## 2021-05-09 IMAGING — CR CHEST - 2 VIEW
1 series · 2 of 2 positions shown · non-contrast
Comparison: 10/15/2018

CLINICAL DATA: Chest pressure

EXAM:
CHEST - 2 VIEW

[Series 1: dg chest 2 view · 0.14mm/px · 2 of 2 slices shown]
[im 1/2]
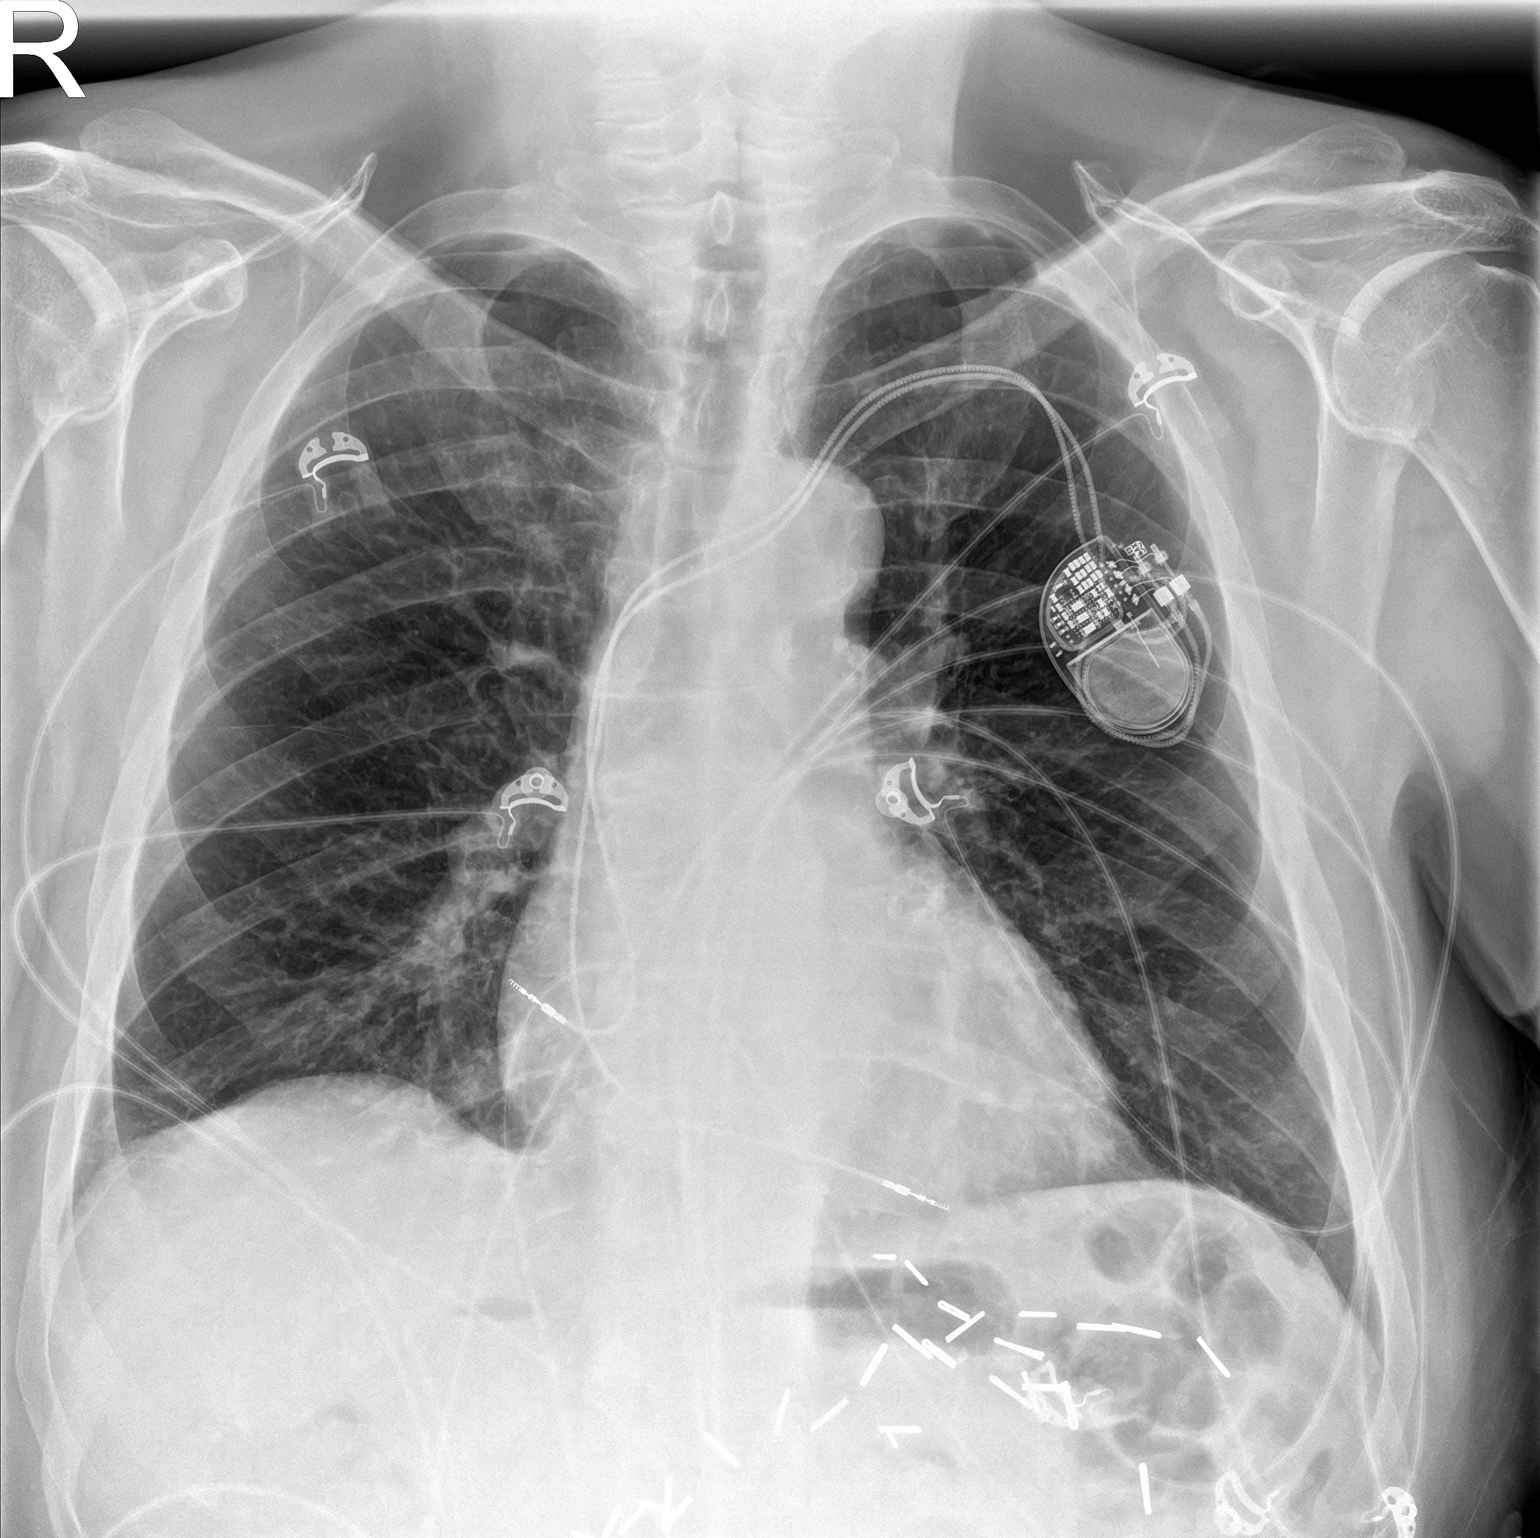
[im 2/2]
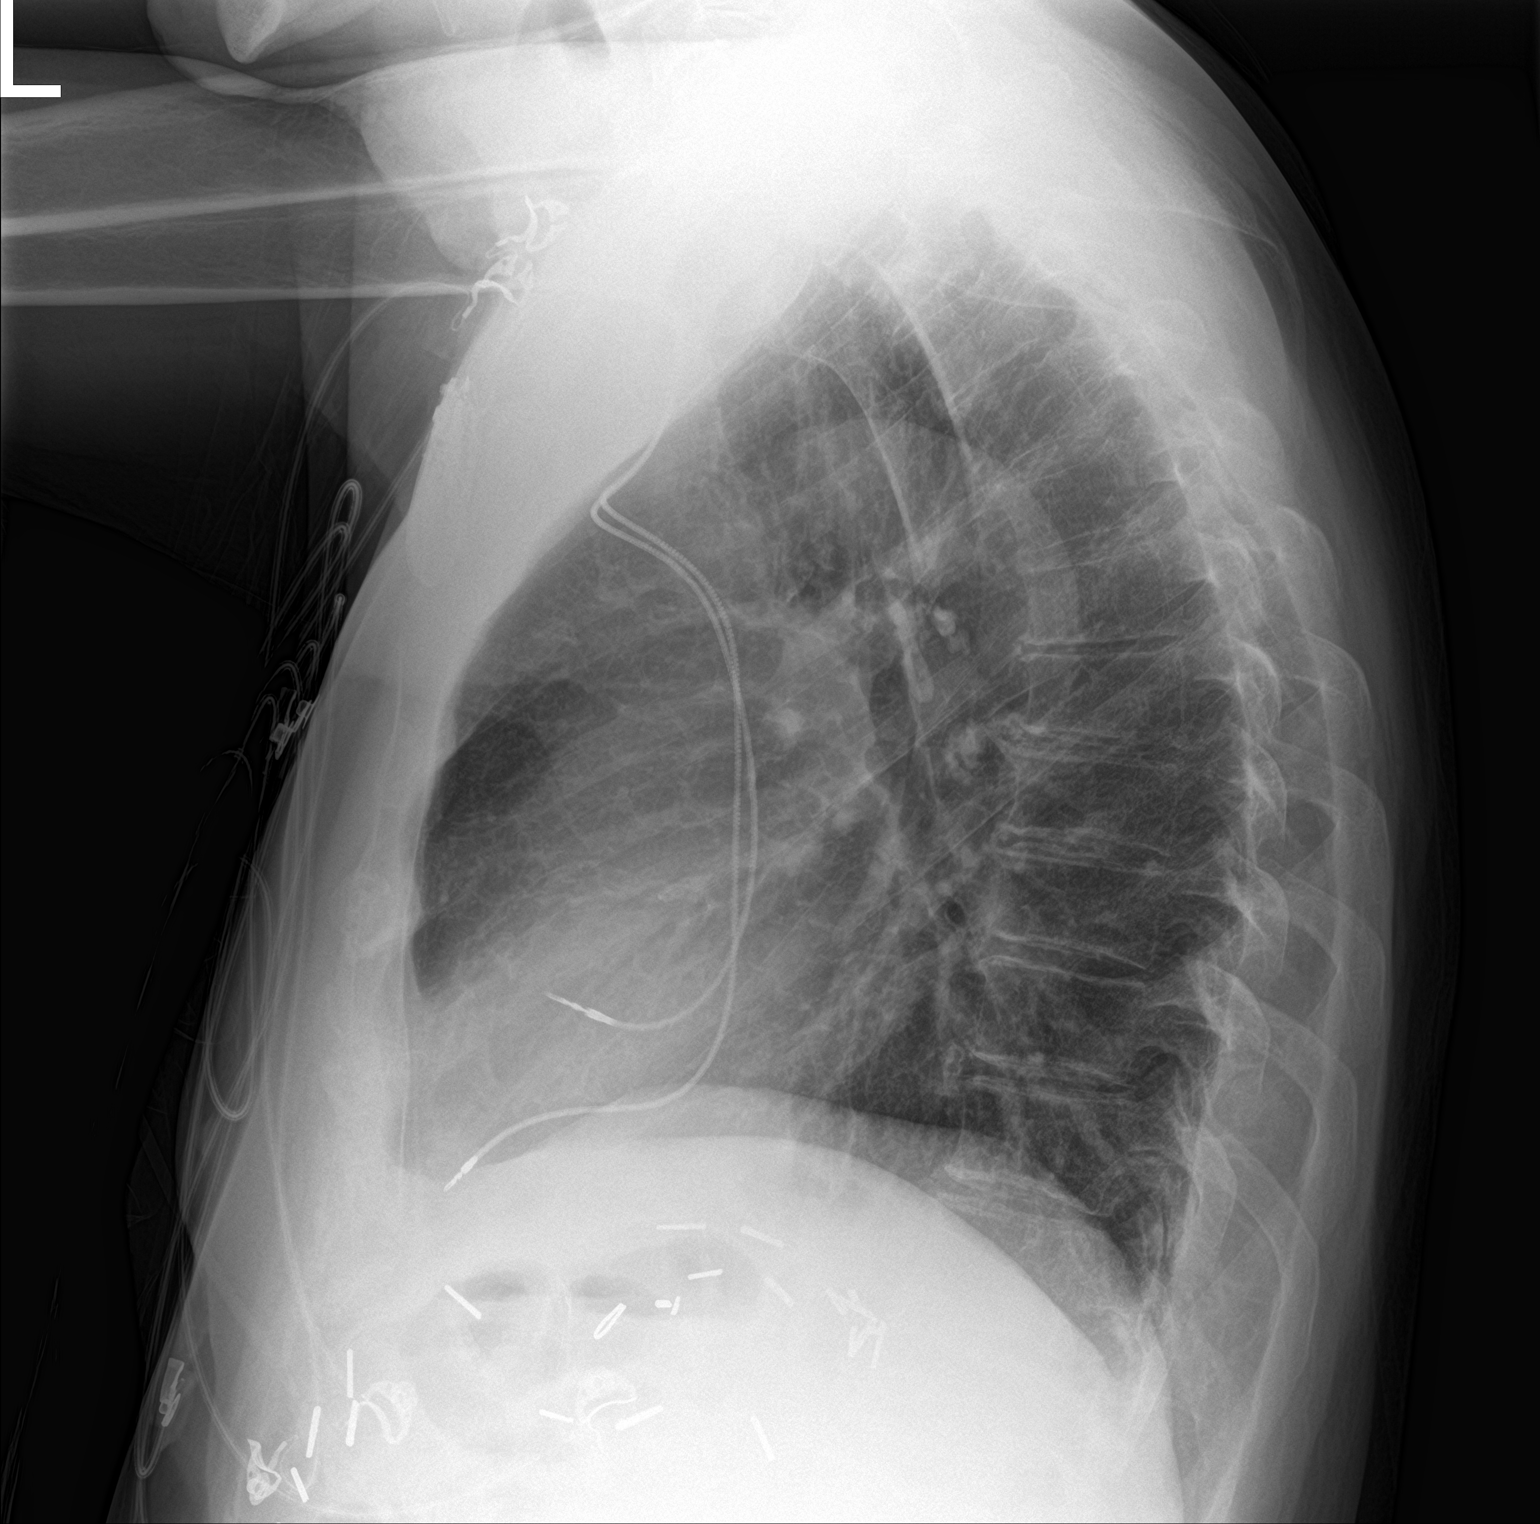

[2 of 2 positions shown; findings below may reference images not displayed]

FINDINGS: A dual chamber left-sided pacemaker is noted. The heart size is
enlarged. There is no pneumothorax. No large pleural effusion. The
lungs are somewhat hyperexpanded. There is slightly prominent
interstitial lung markings bilaterally which is stable from prior
study. Aortic calcifications are noted.
IMPRESSION: No active cardiopulmonary disease.

## 2021-05-09 IMAGING — US US RENAL
1 series · 14 of 25 positions shown · non-contrast
Comparison: None.

CLINICAL DATA: Acute renal insufficiency

EXAM:
RENAL / URINARY TRACT ULTRASOUND COMPLETE

[Series 1: us renal · 14 of 33 slices shown]
[im 1/33]
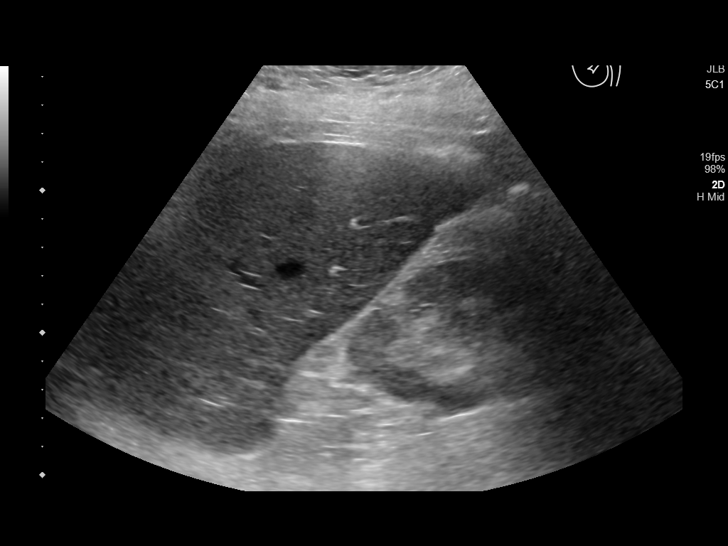
[im 3/33]
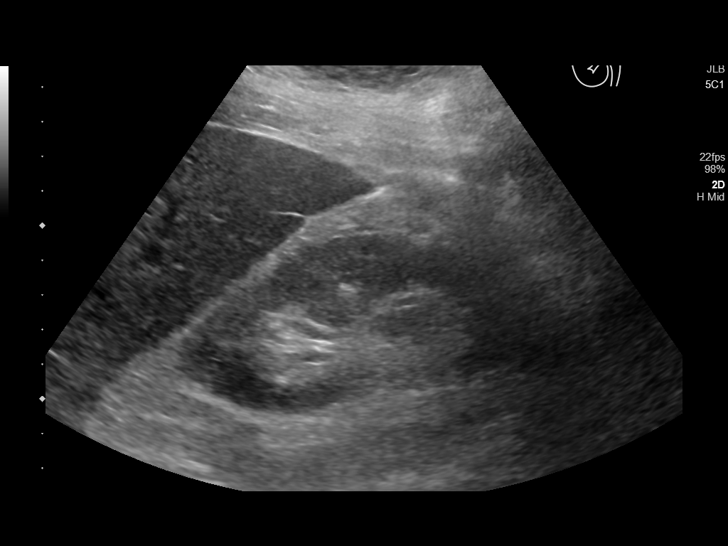
[im 6/33]
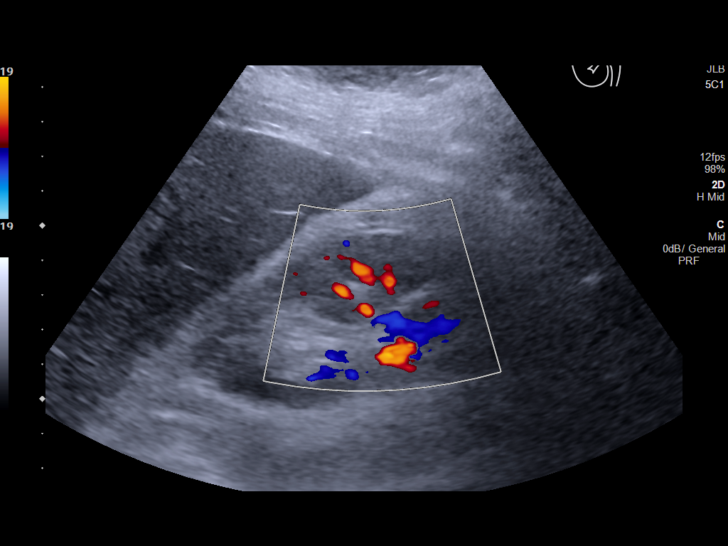
[im 9/33]
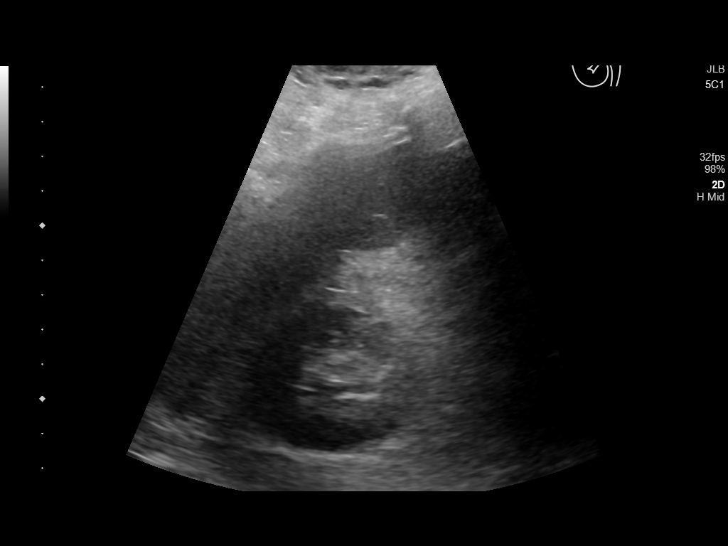
[im 11/33]
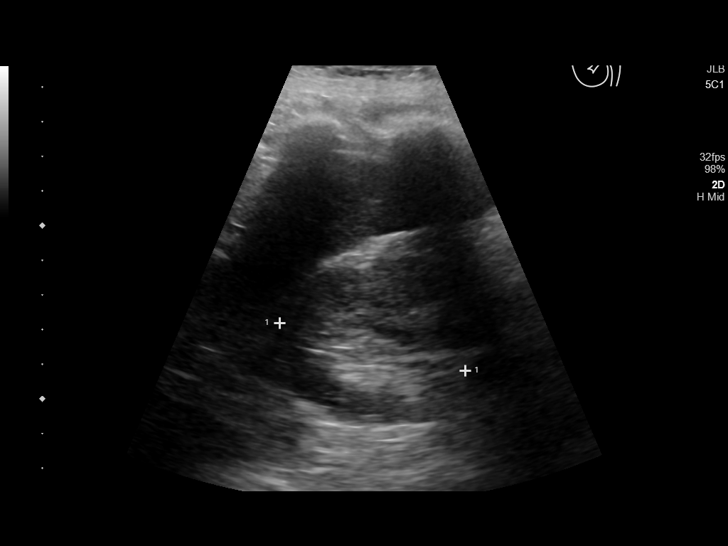
[im 13/33]
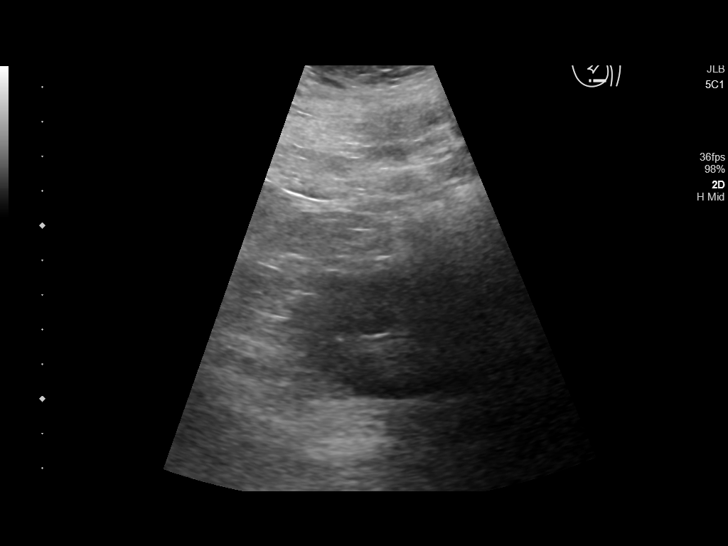
[im 15/33]
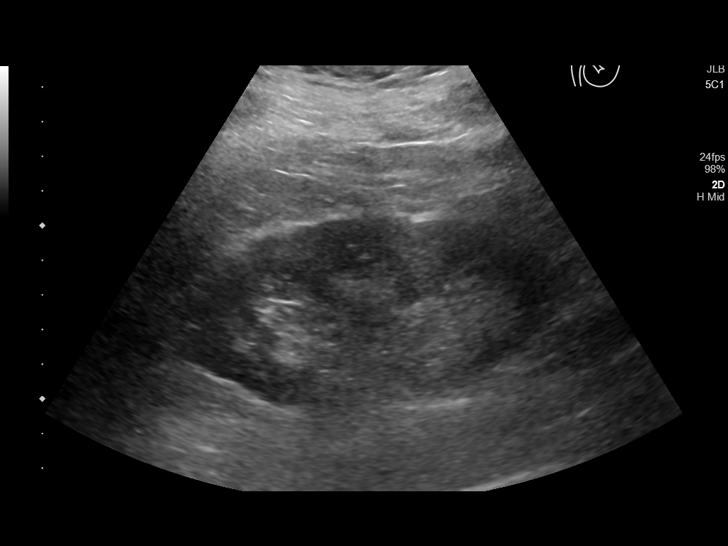
[im 18/33]
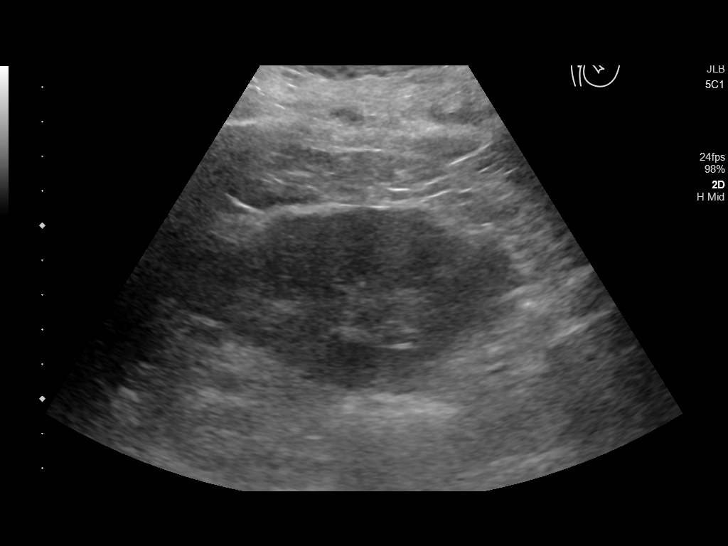
[im 21/33]
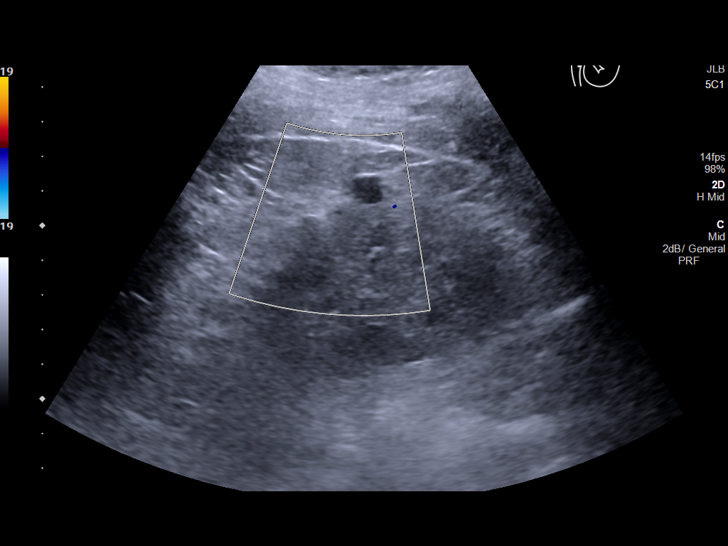
[im 22/33]
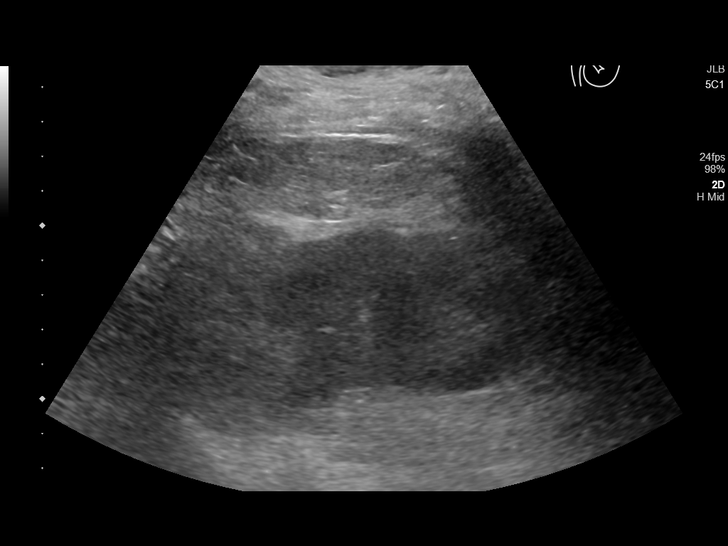
[im 25/33]
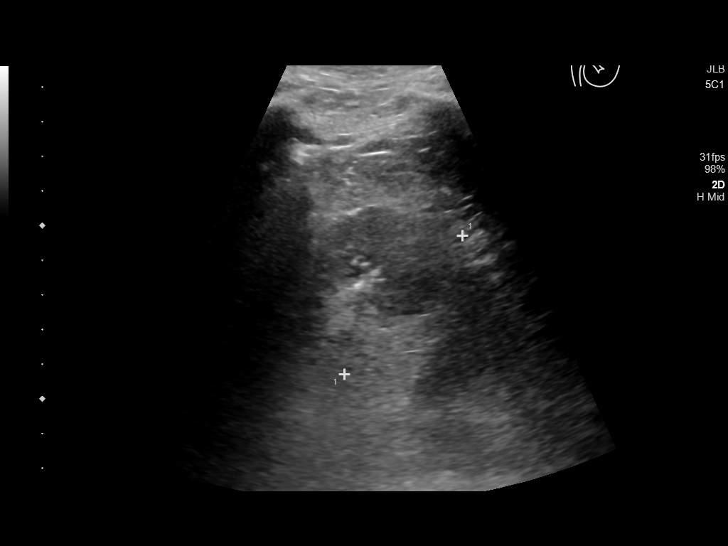
[im 27/33]
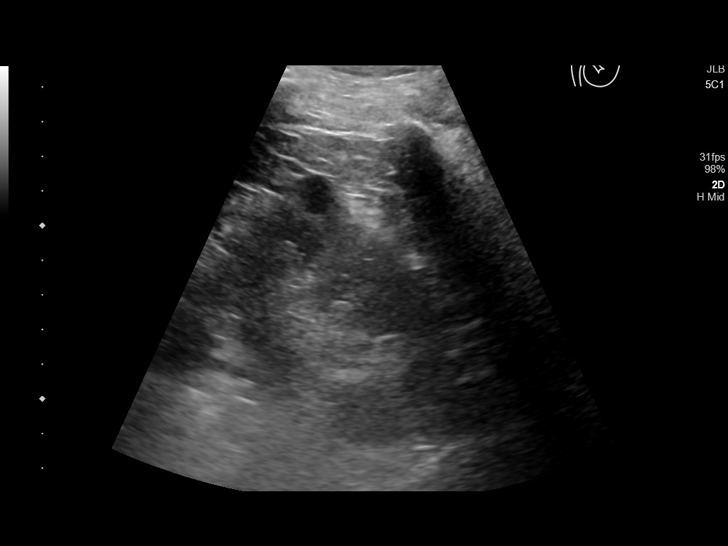
[im 30/33]
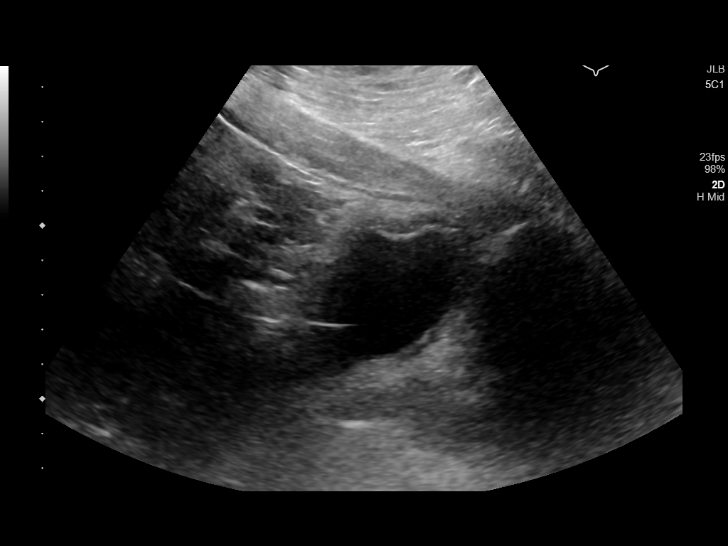
[im 33/33]
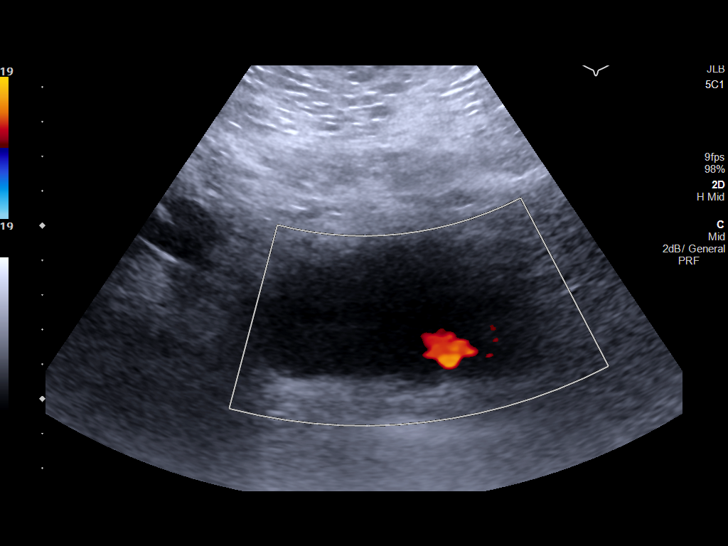

[14 of 25 positions shown; findings below may reference images not displayed]

FINDINGS: Right Kidney:

Renal measurements: 11.1 x 4.8 x 5.5 cm = volume: 153 mL .
Echogenicity within normal limits. No mass or hydronephrosis
visualized.

Left Kidney:

Renal measurements: 11.6 x 5.5 x 5.3 cm = volume: 170 mL. Contains a
10 mm cyst.

Bladder:

Appears normal for degree of bladder distention.
IMPRESSION: Small 10 mm cysts on the left kidney.  No other abnormalities.

## 2021-05-10 IMAGING — US ULTRASOUND ABDOMEN COMPLETE
1 series · 14 of 25 positions shown · non-contrast
Comparison: Ultrasound 10/21/2018.

CLINICAL DATA: Thrombocytopenia.

EXAM:
ABDOMEN ULTRASOUND COMPLETE

[Series 1: ultrasound abdomen complete · 14 of 83 slices shown]
[im 1/83]
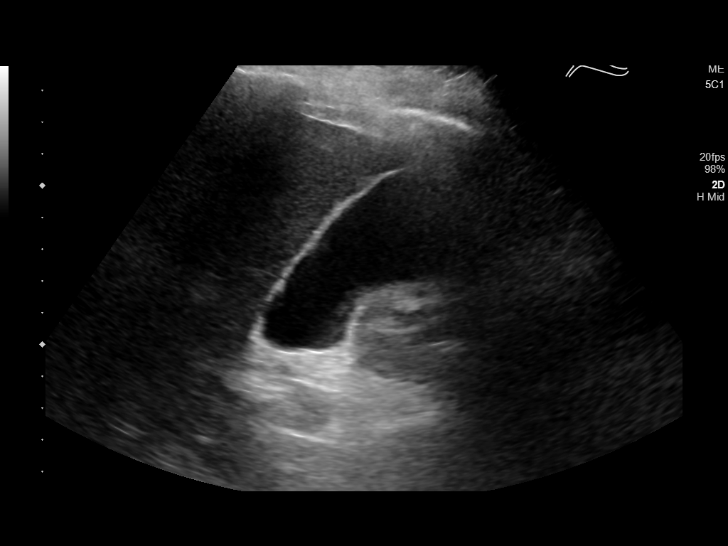
[im 7/83]
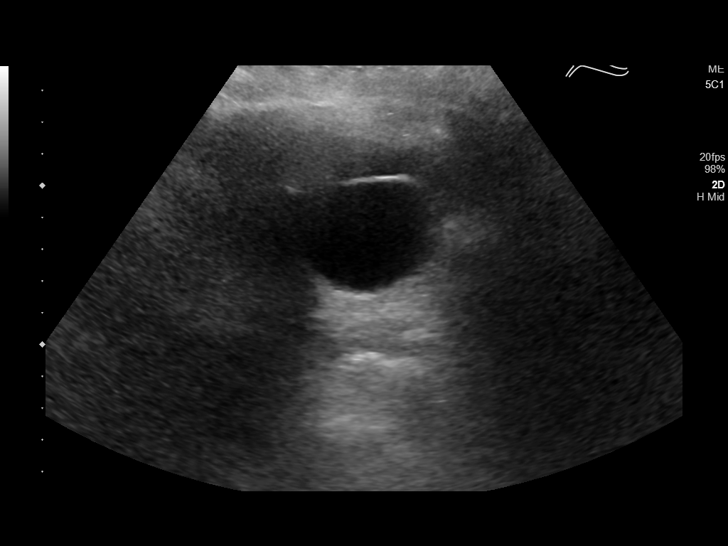
[im 14/83]
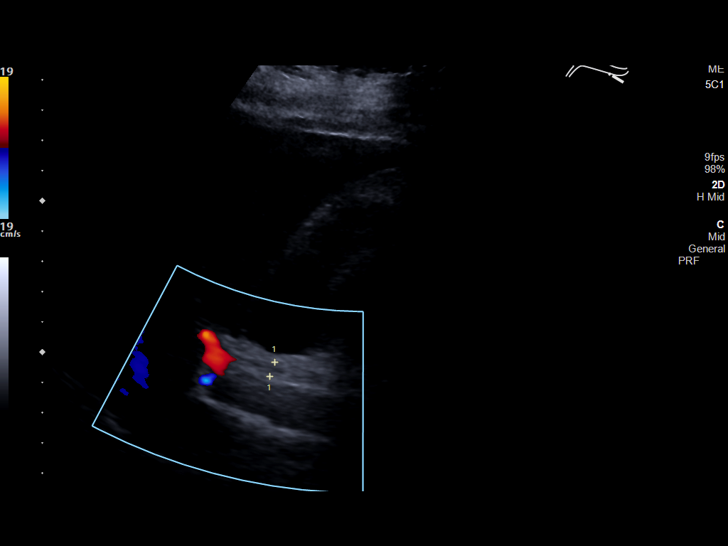
[im 21/83]
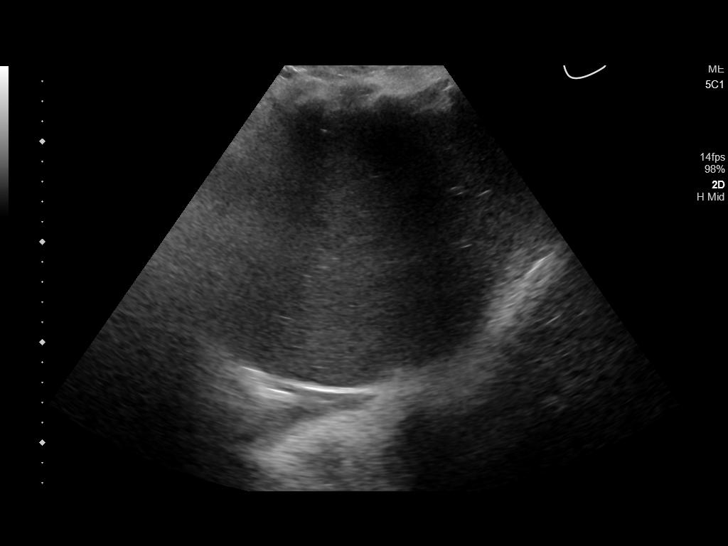
[im 28/83]
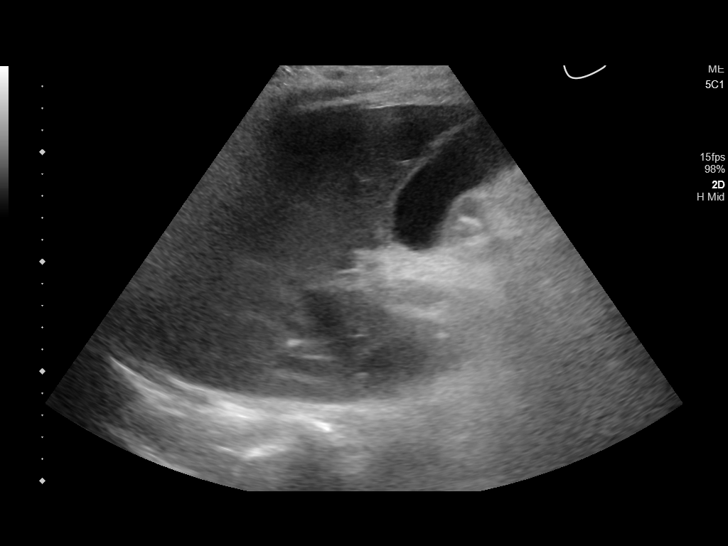
[im 31/83]
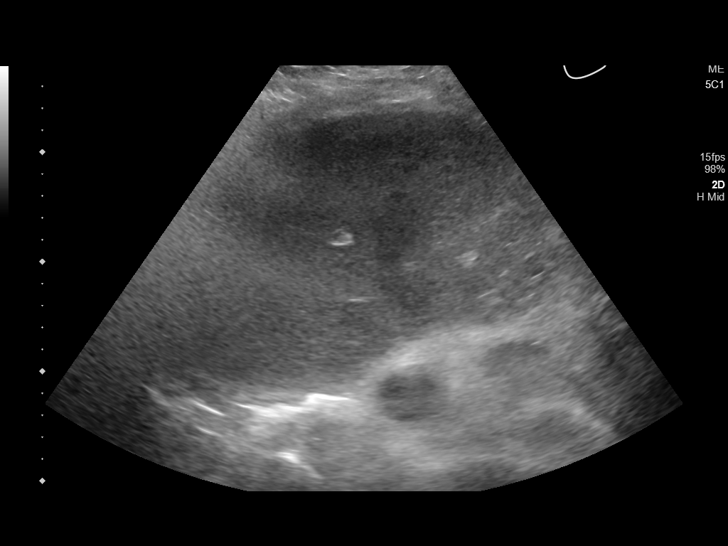
[im 38/83]
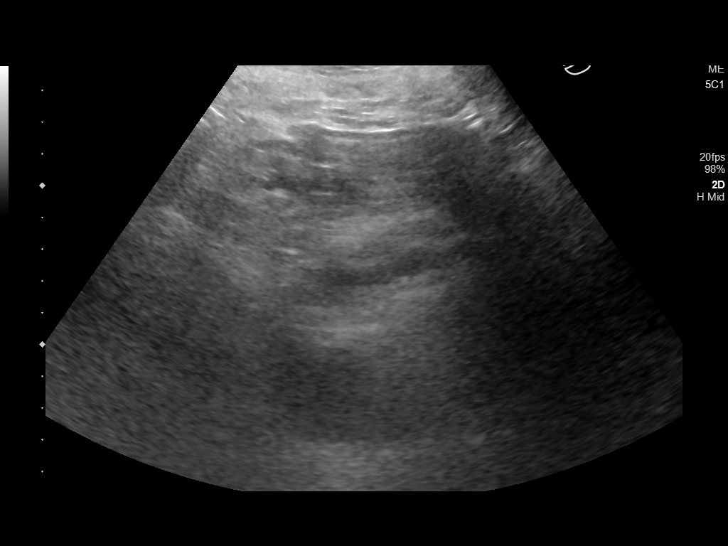
[im 45/83]
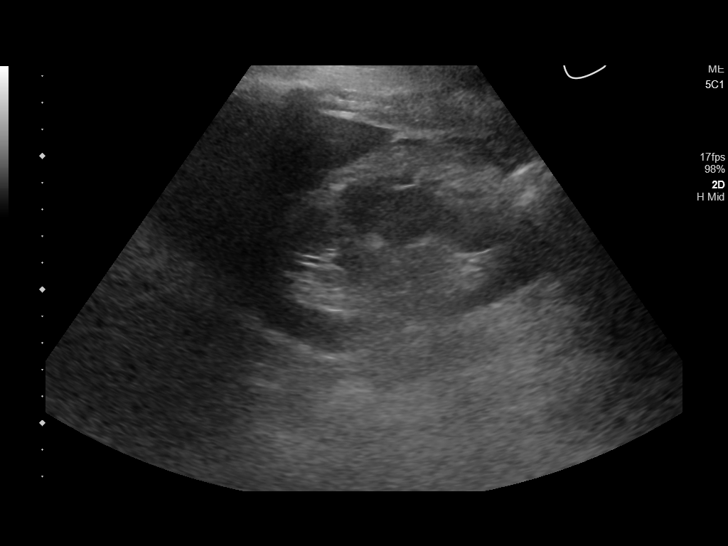
[im 52/83]
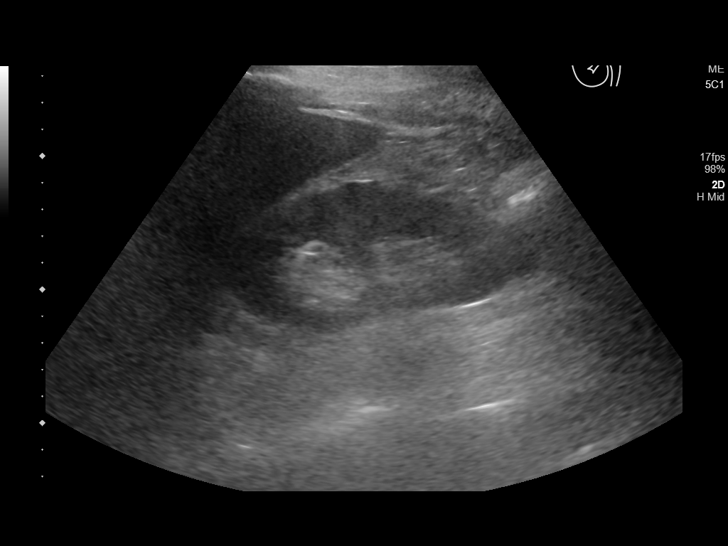
[im 55/83]
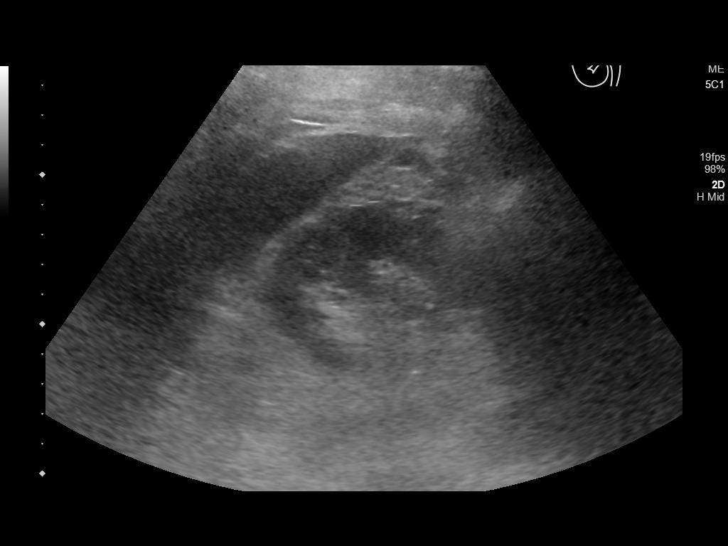
[im 62/83]
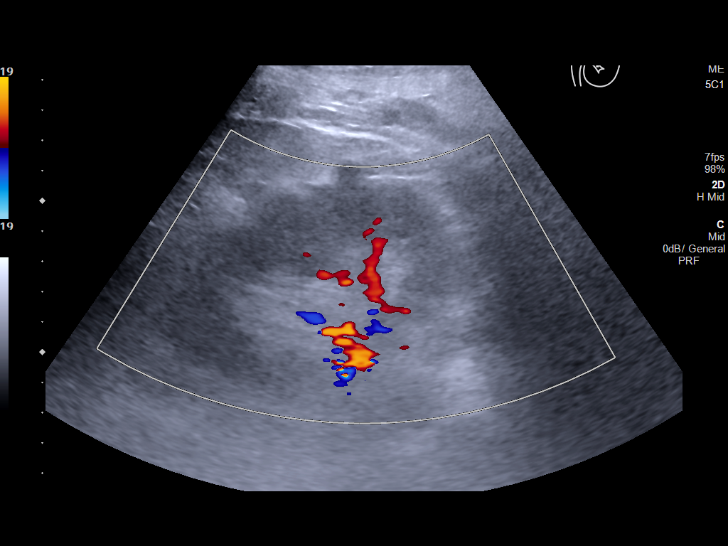
[im 69/83]
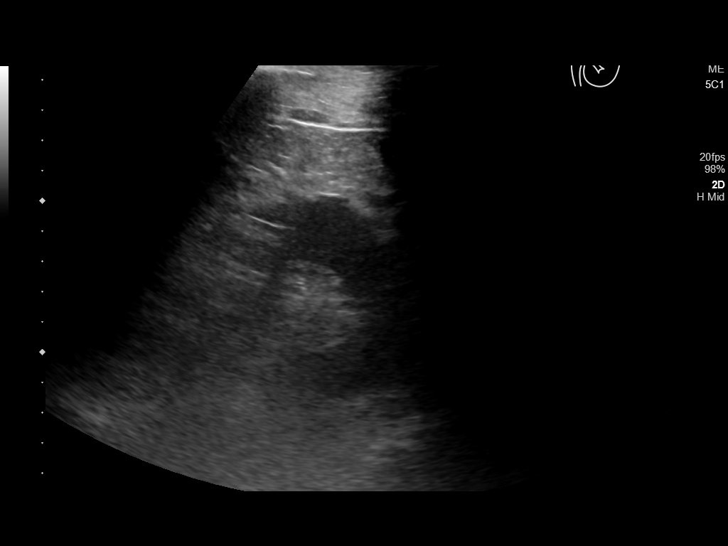
[im 76/83]
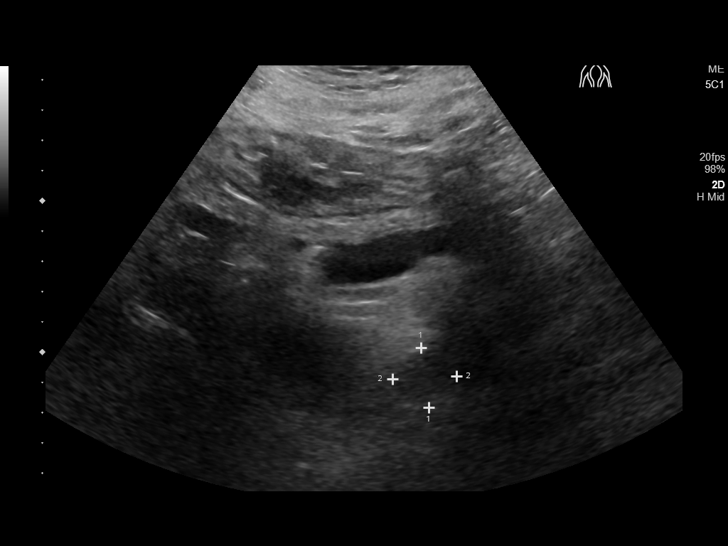
[im 83/83]
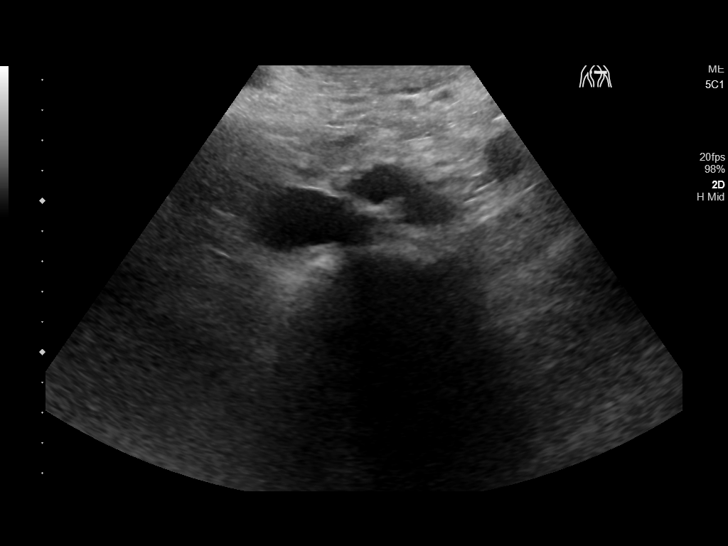

[14 of 25 positions shown; findings below may reference images not displayed]

FINDINGS: Gallbladder: No gallstones or wall thickening visualized. No
sonographic Murphy sign noted by sonographer.

Common bile duct: Diameter: 5.0 mm

Liver: Increased echogenicity consistent fatty infiltration or
hepatocellular disease. Portal vein is patent on color Doppler
imaging with normal direction of blood flow towards the liver.

IVC: No abnormality visualized.

Pancreas: Visualized portion unremarkable.

Spleen: Size and appearance within normal limits. Multiple small
echogenic foci noted most consistent calcified granulomas.

Right Kidney: Length: 11.2 cm. Echogenicity within normal limits. No
mass or hydronephrosis visualized.

Left Kidney: Length: 12.2 cm. Echogenicity within normal limits. No
hydronephrosis visualized. 1.2 cm simple cyst.

Abdominal aorta: No aneurysm visualized.

Other findings: None.
IMPRESSION: 1. Increased hepatic echogenicity consistent fatty infiltration or
hepatocellular disease.

2.  No gallstones or biliary distention.

3.  1.2 cm simple cyst left kidney.

## 2021-05-10 IMAGING — NM NM PULMONARY VENTILATION AND PERFUSION SCAN
2 series · 15 of 15 positions shown · non-contrast
Comparison: Chest x-ray dated 10/22/2018

CLINICAL DATA: Chest pain.

EXAM:
NUCLEAR MEDICINE VENTILATION - PERFUSION LUNG SCAN
TECHNIQUE: Ventilation images were obtained in multiple projections using
inhaled aerosol Zc-YYm DTPA. Perfusion images were obtained in
multiple projections after intravenous injection of Zc-YYm MAA.
RADIOPHARMACEUTICALS:  32.589 mCi of Zc-YYm DTPA aerosol inhalation
and 4.356 mCi 6cRRm MAA IV

[Series 1000: lung ventilation · 3.90mm/px · 4 acquisitions, 7 frames shown]
[im 1/4]
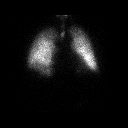
[im 2/4]
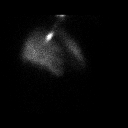
[im 2/4]
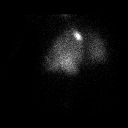
[im 3/4]
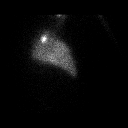
[im 3/4]
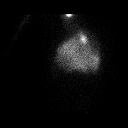
[im 4/4]
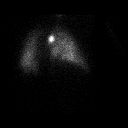
[im 4/4]
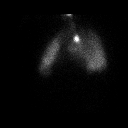

[Series 1000: lung perfusion · 1.95mm/px · 4 acquisitions, 8 frames shown]
[im 1/4]
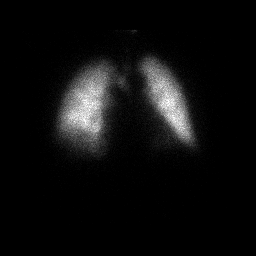
[im 1/4]
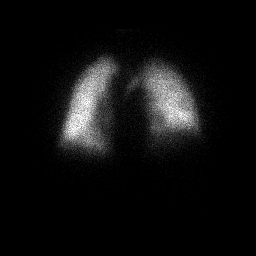
[im 2/4]
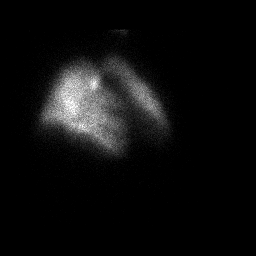
[im 2/4]
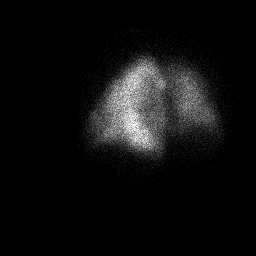
[im 3/4]
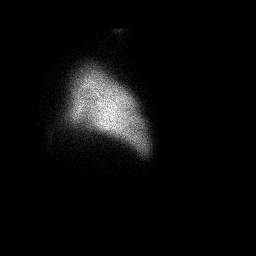
[im 3/4]
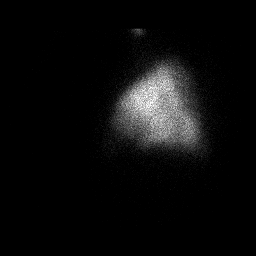
[im 4/4]
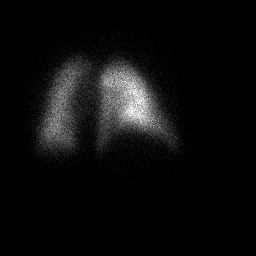
[im 4/4]
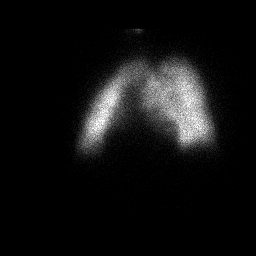

[15 of 15 positions shown; findings below may reference images not displayed]

FINDINGS: Ventilation: No focal ventilation defect.

Perfusion: No wedge shaped peripheral perfusion defects to suggest
acute pulmonary embolism.
IMPRESSION: Normal ventilation perfusion lung scan. No evidence of pulmonary
embolism.

## 2023-03-20 ENCOUNTER — Emergency Department
Admission: EM | Admit: 2023-03-20 | Discharge: 2023-03-20 | Disposition: A | Discharge status: Home / Self Care | Payer: Medicare HMO | Attending: Emergency Medicine | Admitting: Emergency Medicine

## 2023-03-20 ENCOUNTER — Other Ambulatory Visit: Payer: Self-pay

## 2023-03-20 DIAGNOSIS — Z7901 Long term (current) use of anticoagulants: Secondary | ICD-10-CM | POA: Diagnosis not present

## 2023-03-20 DIAGNOSIS — R040 Epistaxis: Secondary | ICD-10-CM | POA: Diagnosis present

## 2023-03-20 LAB — CBC
HCT: 37.3 % — ABNORMAL LOW (ref 39.0–52.0)
Hemoglobin: 12.4 g/dL — ABNORMAL LOW (ref 13.0–17.0)
MCH: 38.6 pg — ABNORMAL HIGH (ref 26.0–34.0)
MCHC: 33.2 g/dL (ref 30.0–36.0)
MCV: 116.2 fL — ABNORMAL HIGH (ref 80.0–100.0)
Platelets: 182 10*3/uL (ref 150–400)
RBC: 3.21 MIL/uL — ABNORMAL LOW (ref 4.22–5.81)
RDW: 14.9 % (ref 11.5–15.5)
WBC: 6.5 10*3/uL (ref 4.0–10.5)
nRBC: 0 % (ref 0.0–0.2)

## 2023-03-20 MED ORDER — OXYMETAZOLINE HCL 0.05 % NA SOLN: 1.0000 | Freq: Once | NASAL | Status: DC

## 2023-03-20 MED ORDER — TRANEXAMIC ACID 1000 MG/10ML IV SOLN
500.0000 mg | Freq: Once | INTRAVENOUS | Status: AC
Start: 2023-03-20 — End: 2023-03-20
  Administered 2023-03-20: 500 mg via TOPICAL
  Filled 2023-03-20: qty 10

## 2023-03-20 NOTE — ED Notes (Signed)
EDP at [facility], packing with TXA placed R nare, will monitor.

## 2023-03-20 NOTE — ED Notes (Signed)
Junious Silk and Blue top sent to lab

## 2023-03-20 NOTE — ED Notes (Signed)
Called [facility] in [place], per Dr. Derrill Kay to schedule appt. Pt can see Dr. Genevive Bi @11 .

## 2023-03-20 NOTE — ED Notes (Signed)
Nosebleed described as recurrent, seen recently for the same, afrin used copiously PTA, declines clamp. Pt manually holding pressure.

## 2023-03-20 NOTE — ED Notes (Signed)
EDP at [facility]. Pt ready for d/c. No active bleeding. R nare packing in place. Will f/u today at 11:00 in ENT [place] office.

## 2023-03-20 NOTE — ED Notes (Addendum)
Resting/ sleeping, mouth breathing, nasal packing in place. NAD, calm. No obvious active bleeding, VSS. Family at [facility]. EDP in to see, at [facility]

## 2023-03-20 NOTE — ED Provider Notes (Signed)
[facility] Provider Note    Event Date/Time   First MD Initiated Contact with Patient 03/20/23 0840     (approximate)   History   Epistaxis   HPI  Mike Townsend is a 77 y.o. male who presents to the emergency department today because of concerns for nosebleed.  Coming from the right naris started around 6:00 this morning.  Patient however had a nosebleed about 2 weeks ago that required ER visit to outside hospital.  He states he did have packing placed at that time. The patient called his ENT today and they were not able to see him until later this morning and advised he present to the ED. He is on eliquis.      Physical Exam   Triage Vital Signs: ED Triage Vitals [03/20/23 0835]  Encounter Vitals Group     BP (!) 140/115     Systolic BP Percentile      Diastolic BP Percentile      Pulse Rate 80     Resp 18     Temp 98 F (36.7 C)     Temp src      SpO2 100 %     Weight      Height      Head Circumference      Peak Flow      Pain Score 0     Pain Loc      Pain Education      Exclude from Growth Chart     Most recent vital signs: Vitals:   03/20/23 0835  BP: (!) 140/115  Pulse: 80  Resp: 18  Temp: 98 F (36.7 C)  SpO2: 100%   General: Awake, alert. CV:  Good peripheral perfusion.  Resp:  Normal effort.  Abd:  No distention.  Other:  Active bleeding from right nares.   ED Results / Procedures / Treatments   Labs (all labs ordered are listed, but only abnormal results are displayed) Labs Reviewed  CBC - Abnormal; Notable for the following components:      Result Value   RBC 3.21 (*)    Hemoglobin 12.4 (*)    HCT 37.3 (*)    MCV 116.2 (*)    MCH 38.6 (*)    All other components within normal limits     EKG  None   RADIOLOGY None    PROCEDURES:  Critical Care performed: No  .Epistaxis Management  Date/Time: 03/20/2023 11:04 AM  Performed by: Phineas Semen, MD Authorized by: Phineas Semen, MD    Procedure details:    Treatment site:  R anterior   Treatment method:  Merocel sponge (with TXA)   Treatment episode: initial   Post-procedure details:    Assessment:  Bleeding stopped     MEDICATIONS ORDERED IN ED: Medications  oxymetazoline (AFRIN) 0.05 % nasal spray 1 spray (1 spray Each Nare Not Given 03/20/23 0845)     IMPRESSION / MDM / ASSESSMENT AND PLAN / ED COURSE  I reviewed the triage vital signs and the nursing notes.                              Differential diagnosis includes, but is not limited to, nose bleed, blood loss.   Patient's presentation is most consistent with acute presentation with potential threat to life or bodily function.  Patient presented to the emergency department today because of concern for nose bleed. On exam  patient dose have active bleeding to the right nares. Had tried afrin at home. Will start with txa.  Patient did have cessation of bleeding after merocel and TXA. Patient can be seen in ENT clinic later this morning. Will discharge to follow up with ENT for further management.    FINAL CLINICAL IMPRESSION(S) / ED DIAGNOSES   Final diagnoses:  Epistaxis    Note:  This document was prepared using Dragon voice recognition software and may include unintentional dictation errors.    Phineas Semen, MD 03/20/23 [phone]

## 2023-03-20 NOTE — ED Triage Notes (Signed)
Pt comes with c/o nose bleed. Pt states this started at 0600. Pt states he is on eliquis. Pt has bleeding present. Attempted to place clamp but pt refused. Pt states this has been going on and off for about 2 weeks.

## 2023-10-13 ENCOUNTER — Other Ambulatory Visit: Payer: Self-pay

## 2023-10-13 ENCOUNTER — Emergency Department

## 2023-10-13 ENCOUNTER — Observation Stay
Admission: EM | Admit: 2023-10-13 | Discharge: 2023-10-19 | Disposition: A | Discharge status: Skilled Nursing Facility | Attending: Internal Medicine | Admitting: Internal Medicine

## 2023-10-13 DIAGNOSIS — I214 Non-ST elevation (NSTEMI) myocardial infarction: Secondary | ICD-10-CM | POA: Diagnosis not present

## 2023-10-13 DIAGNOSIS — E039 Hypothyroidism, unspecified: Secondary | ICD-10-CM | POA: Insufficient documentation

## 2023-10-13 DIAGNOSIS — D7589 Other specified diseases of blood and blood-forming organs: Secondary | ICD-10-CM | POA: Insufficient documentation

## 2023-10-13 DIAGNOSIS — R2681 Unsteadiness on feet: Secondary | ICD-10-CM | POA: Diagnosis not present

## 2023-10-13 DIAGNOSIS — S92501A Displaced unspecified fracture of right lesser toe(s), initial encounter for closed fracture: Secondary | ICD-10-CM

## 2023-10-13 DIAGNOSIS — E871 Hypo-osmolality and hyponatremia: Secondary | ICD-10-CM | POA: Diagnosis not present

## 2023-10-13 DIAGNOSIS — N1832 Chronic kidney disease, stage 3b: Secondary | ICD-10-CM | POA: Diagnosis not present

## 2023-10-13 DIAGNOSIS — S93104A Unspecified dislocation of right toe(s), initial encounter: Secondary | ICD-10-CM | POA: Diagnosis not present

## 2023-10-13 DIAGNOSIS — R001 Bradycardia, unspecified: Secondary | ICD-10-CM | POA: Diagnosis present

## 2023-10-13 DIAGNOSIS — M6281 Muscle weakness (generalized): Secondary | ICD-10-CM | POA: Diagnosis not present

## 2023-10-13 DIAGNOSIS — I480 Paroxysmal atrial fibrillation: Secondary | ICD-10-CM | POA: Insufficient documentation

## 2023-10-13 DIAGNOSIS — I495 Sick sinus syndrome: Secondary | ICD-10-CM | POA: Diagnosis not present

## 2023-10-13 DIAGNOSIS — I130 Hypertensive heart and chronic kidney disease with heart failure and stage 1 through stage 4 chronic kidney disease, or unspecified chronic kidney disease: Secondary | ICD-10-CM | POA: Diagnosis not present

## 2023-10-13 DIAGNOSIS — I5032 Chronic diastolic (congestive) heart failure: Secondary | ICD-10-CM | POA: Insufficient documentation

## 2023-10-13 DIAGNOSIS — S93124A Dislocation of metatarsophalangeal joint of right lesser toe(s), initial encounter: Secondary | ICD-10-CM | POA: Diagnosis not present

## 2023-10-13 DIAGNOSIS — I2510 Atherosclerotic heart disease of native coronary artery without angina pectoris: Secondary | ICD-10-CM | POA: Diagnosis not present

## 2023-10-13 DIAGNOSIS — S62616A Displaced fracture of proximal phalanx of right little finger, initial encounter for closed fracture: Secondary | ICD-10-CM | POA: Insufficient documentation

## 2023-10-13 DIAGNOSIS — R55 Syncope and collapse: Secondary | ICD-10-CM | POA: Diagnosis not present

## 2023-10-13 DIAGNOSIS — F1090 Alcohol use, unspecified, uncomplicated: Secondary | ICD-10-CM | POA: Diagnosis not present

## 2023-10-13 DIAGNOSIS — D696 Thrombocytopenia, unspecified: Secondary | ICD-10-CM | POA: Diagnosis not present

## 2023-10-13 DIAGNOSIS — W19XXXA Unspecified fall, initial encounter: Secondary | ICD-10-CM | POA: Diagnosis not present

## 2023-10-13 DIAGNOSIS — I10 Essential (primary) hypertension: Secondary | ICD-10-CM | POA: Diagnosis present

## 2023-10-13 DIAGNOSIS — I420 Dilated cardiomyopathy: Secondary | ICD-10-CM | POA: Diagnosis not present

## 2023-10-13 LAB — FOLATE: Folate: 11.1 ng/mL (ref >5.9)

## 2023-10-13 LAB — TECHNOLOGIST SMEAR REVIEW: Plt Morphology: NORMAL

## 2023-10-13 LAB — CBC WITH DIFFERENTIAL/PLATELET
Abs Immature Granulocytes: 0.03 10*3/uL (ref 0.00–0.07)
Abs Immature Granulocytes: 0.02 10*3/uL (ref 0.00–0.07)
Basophils Absolute: 0 10*3/uL (ref 0.0–0.1)
Basophils Absolute: 0 10*3/uL (ref 0.0–0.1)
Basophils Relative: 1 %
Basophils Relative: 0 %
Eosinophils Absolute: 0.1 10*3/uL (ref 0.0–0.5)
Eosinophils Absolute: 0.1 10*3/uL (ref 0.0–0.5)
Eosinophils Relative: 2 %
Eosinophils Relative: 2 %
HCT: 36.7 % — ABNORMAL LOW (ref 39.0–52.0)
HCT: 31.6 % — ABNORMAL LOW (ref 39.0–52.0)
Hemoglobin: 12.6 g/dL — ABNORMAL LOW (ref 13.0–17.0)
Hemoglobin: 10.9 g/dL — ABNORMAL LOW (ref 13.0–17.0)
Immature Granulocytes: 0 %
Immature Granulocytes: 0 %
Lymphocytes Relative: 12 %
Lymphocytes Relative: 13 %
Lymphs Abs: 0.9 10*3/uL (ref 0.7–4.0)
Lymphs Abs: 0.9 10*3/uL (ref 0.7–4.0)
MCH: 38.8 pg — ABNORMAL HIGH (ref 26.0–34.0)
MCH: 38.2 pg — ABNORMAL HIGH (ref 26.0–34.0)
MCHC: 34.3 g/dL (ref 30.0–36.0)
MCHC: 34.5 g/dL (ref 30.0–36.0)
MCV: 112.9 fL — ABNORMAL HIGH (ref 80.0–100.0)
MCV: 110.9 fL — ABNORMAL HIGH (ref 80.0–100.0)
Monocytes Absolute: 0.6 10*3/uL (ref 0.1–1.0)
Monocytes Absolute: 0.6 10*3/uL (ref 0.1–1.0)
Monocytes Relative: 8 %
Monocytes Relative: 8 %
Neutro Abs: 5.5 10*3/uL (ref 1.7–7.7)
Neutro Abs: 5.3 10*3/uL (ref 1.7–7.7)
Neutrophils Relative %: 77 %
Neutrophils Relative %: 77 %
Platelets: 131 10*3/uL — ABNORMAL LOW (ref 150–400)
Platelets: 124 10*3/uL — ABNORMAL LOW (ref 150–400)
RBC: 3.25 MIL/uL — ABNORMAL LOW (ref 4.22–5.81)
RBC: 2.85 MIL/uL — ABNORMAL LOW (ref 4.22–5.81)
RDW: 14.6 % (ref 11.5–15.5)
RDW: 14.6 % (ref 11.5–15.5)
Smear Review: NORMAL
WBC: 7.2 10*3/uL (ref 4.0–10.5)
WBC: 7 10*3/uL (ref 4.0–10.5)
nRBC: 0 % (ref 0.0–0.2)
nRBC: 0 % (ref 0.0–0.2)

## 2023-10-13 LAB — URINALYSIS, ROUTINE W REFLEX MICROSCOPIC
Bacteria, UA: NONE SEEN
Bilirubin Urine: NEGATIVE
Glucose, UA: NEGATIVE mg/dL
Hgb urine dipstick: NEGATIVE
Ketones, ur: NEGATIVE mg/dL
Leukocytes,Ua: NEGATIVE
Nitrite: NEGATIVE
Protein, ur: NEGATIVE mg/dL
Specific Gravity, Urine: 1.018 (ref 1.005–1.030)
Squamous Epithelial / HPF: 0 /HPF (ref 0–5)
pH: 6 (ref 5.0–8.0)

## 2023-10-13 LAB — COMPREHENSIVE METABOLIC PANEL WITH GFR
ALT: 22 U/L (ref 0–44)
AST: 22 U/L (ref 15–41)
Albumin: 3.5 g/dL (ref 3.5–5.0)
Alkaline Phosphatase: 56 U/L (ref 38–126)
Anion gap: 7 (ref 5–15)
BUN: 29 mg/dL — ABNORMAL HIGH (ref 8–23)
CO2: 23 mmol/L (ref 22–32)
Calcium: 8.4 mg/dL — ABNORMAL LOW (ref 8.9–10.3)
Chloride: 99 mmol/L (ref 98–111)
Creatinine, Ser: 1.56 mg/dL — ABNORMAL HIGH (ref 0.61–1.24)
GFR, Estimated: 46 mL/min — ABNORMAL LOW (ref >60)
Glucose, Bld: 117 mg/dL — ABNORMAL HIGH (ref 70–99)
Potassium: 4.2 mmol/L (ref 3.5–5.1)
Sodium: 129 mmol/L — ABNORMAL LOW (ref 135–145)
Total Bilirubin: 1.3 mg/dL — ABNORMAL HIGH (ref 0.0–1.2)
Total Protein: 6.1 g/dL — ABNORMAL LOW (ref 6.5–8.1)

## 2023-10-13 LAB — OSMOLALITY: Osmolality: 277 mosm/kg (ref 275–295)

## 2023-10-13 LAB — TROPONIN I (HIGH SENSITIVITY)
Troponin I (High Sensitivity): 8 ng/L (ref <18)
Troponin I (High Sensitivity): 8 ng/L (ref <18)

## 2023-10-13 LAB — OSMOLALITY, URINE: Osmolality, Ur: 565 mosm/kg (ref 300–900)

## 2023-10-13 LAB — TSH: TSH: 3.479 u[IU]/mL (ref 0.350–4.500)

## 2023-10-13 LAB — MAGNESIUM: Magnesium: 2 mg/dL (ref 1.7–2.4)

## 2023-10-13 LAB — SODIUM, URINE, RANDOM: Sodium, Ur: 49 mmol/L

## 2023-10-13 LAB — VITAMIN B12: Vitamin B-12: 320 pg/mL (ref 180–914)

## 2023-10-13 MED ORDER — APIXABAN 5 MG PO TABS
5.0000 mg | ORAL_TABLET | Freq: Two times a day (BID) | ORAL | Status: DC
  Administered 2023-10-13 – 2023-10-14 (×3): 5 mg via ORAL
  Filled 2023-10-13 (×4): qty 1

## 2023-10-13 MED ORDER — AMIODARONE HCL 200 MG PO TABS
100.0000 mg | ORAL_TABLET | Freq: Every day | ORAL | Status: DC
  Administered 2023-10-13 – 2023-10-19 (×7): 100 mg via ORAL
  Filled 2023-10-13 (×8): qty 1

## 2023-10-13 MED ORDER — METOPROLOL SUCCINATE ER 25 MG PO TB24: 25.0000 mg | ORAL_TABLET | Freq: Every day | ORAL | Status: DC

## 2023-10-13 MED ORDER — CARVEDILOL 6.25 MG PO TABS
3.1250 mg | ORAL_TABLET | Freq: Two times a day (BID) | ORAL | Status: DC
  Filled 2023-10-13: qty 1

## 2023-10-13 MED ORDER — ONDANSETRON HCL 4 MG/2ML IJ SOLN
4.0000 mg | Freq: Once | INTRAMUSCULAR | Status: AC
  Administered 2023-10-13: 4 mg via INTRAVENOUS
  Filled 2023-10-13: qty 2

## 2023-10-13 MED ORDER — FENTANYL CITRATE PF 50 MCG/ML IJ SOSY
50.0000 ug | PREFILLED_SYRINGE | Freq: Once | INTRAMUSCULAR | Status: AC
  Administered 2023-10-13: 50 ug via INTRAVENOUS
  Filled 2023-10-13: qty 1

## 2023-10-13 MED ORDER — SODIUM CHLORIDE 0.9 % IV SOLN: 250.0000 mL | INTRAVENOUS | Status: DC | PRN

## 2023-10-13 MED ORDER — ROSUVASTATIN CALCIUM 10 MG PO TABS
20.0000 mg | ORAL_TABLET | Freq: Every day | ORAL | Status: DC
  Administered 2023-10-13 – 2023-10-19 (×7): 20 mg via ORAL
  Filled 2023-10-13 (×2): qty 2
  Filled 2023-10-13: qty 1
  Filled 2023-10-13 (×2): qty 2
  Filled 2023-10-13: qty 1
  Filled 2023-10-13: qty 2
  Filled 2023-10-13: qty 4

## 2023-10-13 MED ORDER — OXYCODONE HCL 5 MG PO TABS
5.0000 mg | ORAL_TABLET | Freq: Four times a day (QID) | ORAL | Status: DC | PRN
  Administered 2023-10-13 (×2): 5 mg via ORAL
  Filled 2023-10-13 (×2): qty 1

## 2023-10-13 MED ORDER — SODIUM CHLORIDE 0.9 % IV BOLUS (SEPSIS)
1000.0000 mL | Freq: Once | INTRAVENOUS | Status: AC
  Administered 2023-10-13: 1000 mL via INTRAVENOUS

## 2023-10-13 MED ORDER — ALLOPURINOL 100 MG PO TABS
300.0000 mg | ORAL_TABLET | Freq: Every day | ORAL | Status: DC
  Administered 2023-10-13 – 2023-10-19 (×7): 300 mg via ORAL
  Filled 2023-10-13 (×3): qty 3
  Filled 2023-10-13: qty 1
  Filled 2023-10-13 (×2): qty 3
  Filled 2023-10-13: qty 1
  Filled 2023-10-13: qty 3

## 2023-10-13 MED ORDER — SODIUM CHLORIDE 0.9% FLUSH
3.0000 mL | Freq: Two times a day (BID) | INTRAVENOUS | Status: DC
  Administered 2023-10-13: 3 mL via INTRAVENOUS

## 2023-10-13 MED ORDER — SODIUM CHLORIDE 0.9% FLUSH: 3.0000 mL | INTRAVENOUS | Status: DC | PRN

## 2023-10-13 MED ORDER — LEVOTHYROXINE SODIUM 50 MCG PO TABS
75.0000 ug | ORAL_TABLET | Freq: Every day | ORAL | Status: DC
  Administered 2023-10-14 – 2023-10-19 (×6): 75 ug via ORAL
  Filled 2023-10-13 (×6): qty 1

## 2023-10-13 NOTE — ED Provider Notes (Signed)
 [facility] Provider Note    Event Date/Time   First MD Initiated Contact with Patient 10/13/23 [phone]     (approximate)   History   Fall   HPI  Mike Townsend is a 78 y.o. male  with history provided by with history of atrial fibrillation, hypertension, hyperlipidemia, sick sinus syndrome status post pacemaker who presents to the emergency department with 2 syncopal events in the past 24 hours.  Patient states that his cardiologist thinks that this is from orthostasis and has recommended increase fluid intake.  He states 1 episode happened when he was standing up and the second episode happened when he was sitting up.  Denies any preceding chest pain or shortness of breath.  No recent vomiting or diarrhea.  No fever.  States during the first episode he did injure his right fifth toe and right second toe which is causing him pain with ambulation.  He is complaining of bilateral hip pain.  May have hit his head.  He is on Eliquis .  Patient also reports between the syncopal events he had a mechanical fall.   Past Medical History:  Diagnosis Date   CHF (congestive heart failure) (HCC)    Dysrhythmia    A-Fib   Gout    Hyperlipidemia    Hypertension    Paroxysmal atrial fibrillation (HCC)    Presence of permanent cardiac pacemaker    Medtronic Micra AV  [id number]   Transfusion history    Wrist fracture 12/2019   left    Past Surgical History:  Procedure Laterality Date   CARDIAC CATHETERIZATION     CARDIAC ELECTROPHYSIOLOGY STUDY AND ABLATION     CATARACT EXTRACTION W/PHACO Right 12/23/2019   Procedure: CATARACT EXTRACTION PHACO AND INTRAOCULAR LENS PLACEMENT (IOC) RIGHT  PANOPTIC TORIC LENS;  Surgeon: [name], MD;  Location: [facility];  Service: Ophthalmology;  Laterality: Right;  4.70 0:34.7   CATARACT EXTRACTION W/PHACO Left 01/20/2020   Procedure: CATARACT EXTRACTION PHACO AND INTRAOCULAR LENS PLACEMENT (IOC) LEFT PANOPTIX LENS;   Surgeon: [name], MD;  Location: [facility];  Service: Ophthalmology;  Laterality: Left;  10.12 1:14   LAPAROSCOPIC GASTRIC BYPASS     LEFT HEART CATH AND CORONARY ANGIOGRAPHY N/A 10/24/2018   Procedure: LEFT HEART CATH AND CORONARY ANGIOGRAPHY;  Surgeon: [name], MD;  Location: [facility];  Service: Cardiovascular;  Laterality: N/A;   LUMBAR LAMINECTOMY     PACEMAKER INSERTION N/A 03/22/2016   Procedure: INSERTION PACEMAKER;  Surgeon: [name], MD;  Location: [facility];  Service: Cardiovascular;  Laterality: N/A;   TONSILLECTOMY      MEDICATIONS:  Prior to Admission medications   Medication Sig Start Date End Date Taking? Authorizing Provider  acetaminophen  (TYLENOL ) 325 MG tablet Take 2 tablets (650 mg total) by mouth every 6 (six) hours as needed for mild pain or moderate pain (or Fever >/= 101). 10/25/18   Gouru, Aruna, MD  allopurinol  (ZYLOPRIM ) 300 MG tablet Take 300 mg by mouth daily.    [provider]  amiodarone  (PACERONE ) 200 MG tablet Take 100 mg by mouth daily.     [provider]  ELIQUIS  5 MG TABS tablet Take 5 mg by mouth 2 (two) times a day. 10/15/18   [provider]  losartan  (COZAAR ) 25 MG tablet Take 25 mg by mouth daily.    [provider]  magnesium  oxide (MAG-OX) 400 MG tablet Take 400 mg by mouth daily.  [provider]  metoprolol  succinate (TOPROL -XL) 50 MG 24 hr tablet Take 1 tablet (50 mg total) by mouth daily. Take with or immediately following a meal. Patient taking differently: Take 25 mg by mouth daily. Take with or immediately following a meal. 10/26/18   Gouru, Aruna, MD  rosuvastatin  (CRESTOR ) 20 MG tablet Take 20 mg by mouth daily.    [provider]  Saw Palmetto , Serenoa repens, (SAW PALMETTO  PO) Take 1 capsule by mouth daily.    [provider]    Physical Exam   Triage Vital Signs: ED Triage Vitals  Encounter Vitals Group     BP  10/13/23 0313 134/83     Systolic BP Percentile --      Diastolic BP Percentile --      Pulse Rate 10/13/23 0313 60     Resp 10/13/23 0313 18     Temp 10/13/23 0313 97.7 F (36.5 C)     Temp Source 10/13/23 0313 Oral     SpO2 10/13/23 0310 100 %     Weight 10/13/23 0325 235 lb (106.6 kg)     Height 10/13/23 0325 6\' 2"  (1.88 m)     Head Circumference --      Peak Flow --      Pain Score 10/13/23 0324 8     Pain Loc --      Pain Education --      Exclude from Growth Chart --     Most recent vital signs: Vitals:   10/13/23 0313 10/13/23 0700  BP: 134/83 125/77  Pulse: 60 (!) 59  Resp: 18 16  Temp: 97.7 F (36.5 C)   SpO2: 100% 100%     CONSTITUTIONAL: Alert, responds appropriately to questions. Well-appearing; well-nourished; GCS 15 HEAD: Normocephalic; atraumatic EYES: Conjunctivae clear, PERRL, EOMI ENT: normal nose; no rhinorrhea; moist mucous membranes; pharynx without lesions noted; no dental injury; no septal hematoma, no epistaxis; no facial deformity or bony tenderness NECK: Supple, no midline spinal tenderness, step-off or deformity; trachea midline CARD: RRR; S1 and S2 appreciated; no murmurs, no clicks, no rubs, no gallops RESP: Normal chest excursion without splinting or tachypnea; breath sounds clear and equal bilaterally; no wheezes, no rhonchi, no rales; no hypoxia or respiratory distress CHEST:  chest wall stable, no crepitus or ecchymosis or deformity, nontender to palpation; no flail chest ABD/GI: Non-distended; soft, non-tender, no rebound, no guarding; no ecchymosis or other lesions noted PELVIS:  stable, nontender to palpation BACK:  The back appears normal; no midline spinal tenderness, step-off or deformity EXT: Ecchymosis and soft tissue swelling noted to the right fifth toe, right second toe.  2+ DP pulses bilaterally.  No calf tenderness or calf swelling.  Compartments in both legs are soft. SKIN: Normal color for age and race; warm NEURO: No facial  asymmetry, normal speech, moving all extremities equally  ED Results / Procedures / Treatments   LABS: (all labs ordered are listed, but only abnormal results are displayed) Labs Reviewed  CBC WITH DIFFERENTIAL/PLATELET - Abnormal; Notable for the following components:      Result Value   RBC 3.25 (*)    Hemoglobin 12.6 (*)    HCT 36.7 (*)    MCV 112.9 (*)    MCH 38.8 (*)    Platelets 131 (*)    All other components within normal limits  COMPREHENSIVE METABOLIC PANEL WITH GFR - Abnormal; Notable for the following components:   Sodium 129 (*)    Glucose, Bld 117 (*)  BUN 29 (*)    Creatinine, Ser 1.56 (*)    Calcium  8.4 (*)    Total Protein 6.1 (*)    Total Bilirubin 1.3 (*)    GFR, Estimated 46 (*)    All other components within normal limits  URINALYSIS, ROUTINE W REFLEX MICROSCOPIC  TROPONIN I (HIGH SENSITIVITY)  TROPONIN I (HIGH SENSITIVITY)     EKG:  EKG Interpretation Date/Time:  Friday October 13 2023 03:30:51 EDT Ventricular Rate:  67 PR Interval:  170 QRS Duration:  144 QT Interval:  474 QTC Calculation: 500 R Axis:   27  Text Interpretation: AV dual-paced rhythm Abnormal ECG When compared with ECG of 20-Oct-2018 23:38, PREVIOUS ECG IS PRESENT Confirmed by Verneda Golder [phone]) on 10/13/2023 7:25:49 AM          RADIOLOGY: My personal review and interpretation of imaging: Patient has a right fifth toe fracture and right second toe mild dislocation.  I have personally reviewed all radiology reports. DG Foot Complete Right Result Date: 10/13/2023 CLINICAL DATA:  Fall, right foot EXAM: RIGHT FOOT COMPLETE - 3+ VIEW COMPARISON:  None Available. FINDINGS: There is an acute fracture of the proximal diaphysis of the right fifth proximal phalanx with fracture fragments in near anatomic alignment. No other fracture. Dorsal lateral dislocation of the right second proximal phalanx. Remaining joint spaces are preserved. Small plantar calcaneal spur. Vascular  calcifications. Mild soft tissue swelling of the dorsal right forefoot. IMPRESSION: 1. Acute fracture of the right fifth proximal phalanx. 2. Dorsal lateral dislocation of the right second proximal phalanx. Electronically Signed   By: Worthy Heads M.D.   On: 10/13/2023 04:44   DG Knee Complete 4 Views Right Result Date: 10/13/2023 CLINICAL DATA:  Fall, right knee pain EXAM: RIGHT KNEE - COMPLETE 4+ VIEW COMPARISON:  None Available. FINDINGS: Normal alignment. No acute fracture or dislocation. Mild tricompartmental degenerative arthritis, most severe within the patellofemoral compartment. Small right knee effusion. Vascular calcifications are noted. IMPRESSION: 1. Mild tricompartmental degenerative arthritis. Small right knee effusion. Electronically Signed   By: Worthy Heads M.D.   On: 10/13/2023 04:42   DG Knee Complete 4 Views Left Result Date: 10/13/2023 CLINICAL DATA:  Fall EXAM: LEFT KNEE - COMPLETE 4+ VIEW COMPARISON:  None Available. FINDINGS: Normal alignment. No acute fracture or dislocation. Mild tricompartmental degenerative arthritis, most severe within the patellofemoral compartment. Small effusion. Vascular calcifications are noted. Varicoid densities within the subcutaneous soft tissues are in keeping with venous varicosities. IMPRESSION: 1. Mild tricompartmental degenerative arthritis. Small effusion. Electronically Signed   By: Worthy Heads M.D.   On: 10/13/2023 04:42   DG Hip Unilat W or Wo Pelvis 2-3 Views Left Result Date: 10/13/2023 CLINICAL DATA:  Fall EXAM: DG HIP (WITH OR WITHOUT PELVIS) 2-3V LEFT COMPARISON:  None Available. FINDINGS: Normal alignment. No acute fracture or dislocation. Mild left hip degenerative arthritis. Vascular calcifications are noted. IMPRESSION: 1. Mild left hip degenerative arthritis. Electronically Signed   By: Worthy Heads M.D.   On: 10/13/2023 04:25   CT Head Wo Contrast Result Date: 10/13/2023 EXAM: CT HEAD AND CERVICAL SPINE 10/13/2023 03:56:49  AM TECHNIQUE: CT of the head and cervical spine was performed without the administration of intravenous contrast. Multiplanar reformatted images are provided for review. Automated exposure control, iterative reconstruction, and/or weight based adjustment of the mA/kV was utilized to reduce the radiation dose to as low as reasonably achievable. COMPARISON: None available. CLINICAL HISTORY: Head trauma, minor (Age >= 65y). Patient C/O three falls in the  last twenty four hours. Patient states that twice the falls were syncopal episodes (he does have orthostatic hypotension), and one was a mechanical fall where he tripped. He does endorse hitting his head during one of the syncopal episodes, and is on eliquis . Patient is alert and oriented x4. He is now C/O left hip pain and bilateral knee pain from the most recent fall (around 0130 this morning). Also of note, patient has a pacemaker and he wonders if his episodes have anything to do with this. FINDINGS: HEAD: BRAIN AND VENTRICLES: There is no acute intracranial hemorrhage, mass effect or midline shift. No abnormal extra-axial fluid collection. The gray-white differentiation is maintained without an acute infarct. There is no hydrocephalus. Global cortical atrophy. Subcortical and periventricular small vessel ischemic changes. Intracranial atherosclerosis. ORBITS: The visualized portion of the orbits demonstrate no acute abnormality. SINUSES: The visualized paranasal sinuses and mastoid air cells demonstrate no acute abnormality. SOFT TISSUES AND SKULL: No acute abnormality of the visualized skull or soft tissues. CERVICAL SPINE: BONES AND ALIGNMENT: There is no acute fracture or traumatic malalignment. DEGENERATIVE CHANGES: Mild degenerative changes at C5-6 and C6-7. SOFT TISSUES: There is no prevertebral soft tissue swelling. IMPRESSION: 1. No acute intracranial abnormality. 2. No acute fracture in the cervical spine. Electronically signed by: Zadie Herter MD  10/13/2023 04:01 AM EDT RP Workstation: [id number]   CT Cervical Spine Wo Contrast Result Date: 10/13/2023 EXAM: CT HEAD AND CERVICAL SPINE 10/13/2023 03:56:49 AM TECHNIQUE: CT of the head and cervical spine was performed without the administration of intravenous contrast. Multiplanar reformatted images are provided for review. Automated exposure control, iterative reconstruction, and/or weight based adjustment of the mA/kV was utilized to reduce the radiation dose to as low as reasonably achievable. COMPARISON: None available. CLINICAL HISTORY: Head trauma, minor (Age >= 65y). Patient C/O three falls in the last twenty four hours. Patient states that twice the falls were syncopal episodes (he does have orthostatic hypotension), and one was a mechanical fall where he tripped. He does endorse hitting his head during one of the syncopal episodes, and is on eliquis . Patient is alert and oriented x4. He is now C/O left hip pain and bilateral knee pain from the most recent fall (around 0130 this morning). Also of note, patient has a pacemaker and he wonders if his episodes have anything to do with this. FINDINGS: HEAD: BRAIN AND VENTRICLES: There is no acute intracranial hemorrhage, mass effect or midline shift. No abnormal extra-axial fluid collection. The gray-white differentiation is maintained without an acute infarct. There is no hydrocephalus. Global cortical atrophy. Subcortical and periventricular small vessel ischemic changes. Intracranial atherosclerosis. ORBITS: The visualized portion of the orbits demonstrate no acute abnormality. SINUSES: The visualized paranasal sinuses and mastoid air cells demonstrate no acute abnormality. SOFT TISSUES AND SKULL: No acute abnormality of the visualized skull or soft tissues. CERVICAL SPINE: BONES AND ALIGNMENT: There is no acute fracture or traumatic malalignment. DEGENERATIVE CHANGES: Mild degenerative changes at C5-6 and C6-7. SOFT TISSUES: There is no prevertebral  soft tissue swelling. IMPRESSION: 1. No acute intracranial abnormality. 2. No acute fracture in the cervical spine. Electronically signed by: Zadie Herter MD 10/13/2023 04:01 AM EDT RP Workstation: [id number]     PROCEDURES:  Critical Care performed: No    .1-3 Lead EKG Interpretation  Performed by: Alyra Patty, Clover Dao, DO Authorized by: Adriahna Shearman, Clover Dao, DO     Interpretation: normal     ECG rate:  60   ECG rate assessment: normal  Rhythm: sinus rhythm     Ectopy: none     Conduction: normal       IMPRESSION / MDM / ASSESSMENT AND PLAN / ED COURSE  I reviewed the triage vital signs and the nursing notes.  Patient here after syncopal event x 2.  Also had an additional fall.  Has injuries to the right foot.  The patient is on the cardiac monitor to evaluate for evidence of arrhythmia and/or significant heart rate changes.   DIFFERENTIAL DIAGNOSIS (includes but not limited to):   Orthostasis, anemia, electrolyte derangement, ACS, arrhythmia, intracranial hemorrhage, skull fracture, cervical spine fracture, foot fracture, foot contusion, foot sprain  Patient's presentation is most consistent with acute presentation with potential threat to life or bodily function.  PLAN: EKG shows paced rhythm.  Hemoglobin normal at 12.6.  First troponin negative.  Second pending.  Mild chronic kidney disease which is stable.  Will give IV fluids and reassess.     CT imaging, x-rays reviewed and interpreted by myself and radiologist.  Patient has a right fifth proximal toe fracture and a left right second toe dislocation.  Dislocation is very mild.  No significant change with attempted reduction.  Will buddy tape to the great toe.  Will place him in a postop shoe.  Will give fentanyl  for his pain.   MEDICATIONS GIVEN IN ED: Medications  sodium chloride  0.9 % bolus 1,000 mL (1,000 mLs Intravenous New Bag/Given 10/13/23 0602)  fentaNYL  (SUBLIMAZE ) injection 50 mcg (50 mcg Intravenous Given  10/13/23 0603)  ondansetron  (ZOFRAN ) injection 4 mg (4 mg Intravenous Given 10/13/23 0603)     ED COURSE: Second troponin negative.  Patient still states he is feeling very sore and is worried about trying to walk.  Initially reluctant to be admitted but now is agreeable.  Pacemaker has been interrogated.  On my review, it does not appear that there were any acute events.  Will discuss with the hospitalist for admission.   CONSULTS:  Consulted and discussed patient's case with hospitalist, Dr. [name].  I have recommended admission and consulting physician agrees and will place admission orders.  Patient (and family if present) agree with this plan.   I reviewed all nursing notes, vitals, pertinent previous records.  All labs, EKGs, imaging ordered have been independently reviewed and interpreted by myself.   Hospitalist requests that I consult cardiology and orthopedics.  Secure chat messages have been sent to Dr. Bob Burn with [facility] cardiology as patient is followed by [facility] as well as Dr. Artie Bimler with orthopedics.   OUTSIDE RECORDS REVIEWED: Reviewed recent cardiology notes.       FINAL CLINICAL IMPRESSION(S) / ED DIAGNOSES   Final diagnoses:  Syncope, unspecified syncope type  Closed fracture of phalanx of right fifth toe, initial encounter  Closed dislocation of second toe of right foot, initial encounter     Rx / DC Orders   ED Discharge Orders     None        Note:  This document was prepared using Dragon voice recognition software and may include unintentional dictation errors.   Kolbi Altadonna, Clover Dao, DO 10/13/23 [phone]

## 2023-10-13 NOTE — Consult Note (Signed)
 [facility] CLINIC CARDIOLOGY CONSULT NOTE       Patient ID: Mike Townsend MRN: [id number] DOB/AGE: 1946/01/22 78 y.o.  Admit date: 10/13/2023 Referring Physician Dr. Hines Ludwig Primary Physician [name], MD Primary Cardiologist Dr. Laury Portela [facility] Cardiology) Reason for Consultation Syncope  HPI: Mike Townsend is a 78 y.o. male  with a past medical history of SSS, DCM with improved pEF s/p CRT-P (upgraded from micra 01/2023), paroxysmal atrial fibrillation (recent DCCV 06/27/23 converted to AV pacing), coronary artery disease, hyperlipidemia, hx hypotension, CKD stage 3b  who presented to the ED on 10/13/2023 for syncope. Cardiology was consulted for further evaluation.   Patient presented to the ED with after having 2 syncopal evidence. Patient denies chest pain or SOB prior to syncopal events. Patient states he felt lightheaded right before LOC. Work up in the ED notable for Na 129, K 4.2, Cr 1.56, Hgb 12.6> 10.9, plts 131.Ct cervical and head with no evidence of acute abnormalities. Foot XR with acute fracture of right 5th p[proximal phalanx, Dorsal lateral dislocation of the right second proximal phalanx. Trops negative x2. EKG in ED AV-paced, rate 67 bpm. Patient started in IVFs and resumed on home meds.   At the time of my evaluation this afternoon, patient was resting comfortably in ED stretcher in no acute distress. We discussed patients sxs in further detail. Patient states he had 2 episodes of syncopal events at home. Patient reports after standing up to go to bathroom he felt lightheaded and passed out and LOC. The second time we also was getting up from chair and LOC. Patient denies any associated chest pain, SOB, or palpitations. Patient states he has a history of low BP at home and normally drinks a lot of water which resolves his BP. Patient states he's been having syncopal episodes for years and these two episodes are similar to his prior syncopal events.   Pertinent Cardiac  History (Most recent) Device Interrogation (10/13/23) - No evidence of atrial fibrillation. No evidence of VT or fast A&V. Pacemaker is adequetly capturing rhythm good threshold and lead impedance. Thoracic impendence revealing no evidence of fluid overload.  (Interrogation done on 09/29/23 showing similar results).   Hx of 3x DCCV for atrial fibrillation.  - Most recent 06/27/2023 patient successful converted to with 1x 360J to A-V paced rhythm. Per device interrogation patient has remained in sinus rhythm with no recurrence of atrial fibrillation.   LHC (10/24/2018) with Dr. Parks Bollman 1.  One-vessel CAD, with heavily calcified 75% stenosis mid, mild to moderate caliber LAD, disease-free large D1 which extends to the apex 2.  Normal left ventricular function with mild apical wall hypokinesis  Review of systems complete and found to be negative unless listed above    Past Medical History:  Diagnosis Date   CHF (congestive heart failure) (HCC)    Dysrhythmia    A-Fib   Gout    Hyperlipidemia    Hypertension    Paroxysmal atrial fibrillation (HCC)    Presence of permanent cardiac pacemaker    Medtronic Micra AV  [id number]   Transfusion history    Wrist fracture 12/2019   left    Past Surgical History:  Procedure Laterality Date   CARDIAC CATHETERIZATION     CARDIAC ELECTROPHYSIOLOGY STUDY AND ABLATION     CATARACT EXTRACTION W/PHACO Right 12/23/2019   Procedure: CATARACT EXTRACTION PHACO AND INTRAOCULAR LENS PLACEMENT (IOC) RIGHT  PANOPTIC TORIC LENS;  Surgeon: [name], MD;  Location: [facility];  Service:  Ophthalmology;  Laterality: Right;  4.70 0:34.7   CATARACT EXTRACTION W/PHACO Left 01/20/2020   Procedure: CATARACT EXTRACTION PHACO AND INTRAOCULAR LENS PLACEMENT (IOC) LEFT PANOPTIX LENS;  Surgeon: [name], MD;  Location: [facility];  Service: Ophthalmology;  Laterality: Left;  10.12 1:14   LAPAROSCOPIC GASTRIC BYPASS     LEFT HEART CATH  AND CORONARY ANGIOGRAPHY N/A 10/24/2018   Procedure: LEFT HEART CATH AND CORONARY ANGIOGRAPHY;  Surgeon: [name], MD;  Location: [facility] INVASIVE CV LAB;  Service: Cardiovascular;  Laterality: N/A;   LUMBAR LAMINECTOMY     PACEMAKER INSERTION N/A 03/22/2016   Procedure: INSERTION PACEMAKER;  Surgeon: [name], MD;  Location: [facility] ORS;  Service: Cardiovascular;  Laterality: N/A;   TONSILLECTOMY      (Not in a hospital admission)  Social History   Socioeconomic History   Marital status: Married    Spouse name: Not on file   Number of children: Not on file   Years of education: Not on file   Highest education level: Not on file  Occupational History   Not on file  Tobacco Use   Smoking status: Former    Types: Pipe    Quit date: 12/08/2018    Years since quitting: 4.8   Smokeless tobacco: Never  Vaping Use   Vaping status: Never Used  Substance and Sexual Activity   Alcohol use: Yes    Comment: wine occ   Drug use: No   Sexual activity: Not on file  Other Topics Concern   Not on file  Social History Narrative   Not on file   Social Drivers of Health   Financial Resource Strain: Low Risk  (04/25/2023)   Received from [facility]   Overall Financial Resource Strain (CARDIA)    Difficulty of Paying Living Expenses: Not hard at all  Food Insecurity: No Food Insecurity (04/25/2023)   [facility] from [facility]   Hunger Vital Sign    Worried About Running Out of Food in the Last Year: Never true    Ran Out of Food in the Last Year: Never true  Transportation Needs: No Transportation Needs (04/25/2023)   Received from [facility] - Transportation    In the past 12 months, has lack of transportation kept you from medical appointments or from getting medications?: No    Lack of Transportation (Non-Medical): No  Physical Activity: Not on file  Stress: Not on file  Social Connections: Not on file   Intimate Partner Violence: Not on file    Family History  Problem Relation Age of Onset   CAD Father      Vitals:   10/13/23 0700 10/13/23 0730 10/13/23 0800 10/13/23 0900  BP: 125/77 124/75 (!) 142/75 99/62  Pulse: (!) 59 (!) 59 61 60  Resp: 16 16 19 17   Temp:      TempSrc:      SpO2: 100% 100% 98% 98%  Weight:      Height:        PHYSICAL EXAM General: well appearing elderly male, well nourished, in no acute distress. HEENT: Normocephalic and atraumatic. Neck: No JVD.   Lungs: Normal respiratory effort on room air. Clear bilaterally to auscultation. No wheezes, crackles, rhonchi.  Heart: HRRR. Normal S1 and S2 without gallops or murmurs.  Abdomen: Non-distended appearing.  Msk: Normal strength and tone for age. Extremities: Warm and well perfused. No clubbing, cyanosis. No edema.  Neuro: Alert and oriented  X 3. Psych: Answers questions appropriately.   Labs: Basic Metabolic Panel: Recent Labs    10/13/23 0559  NA 129*  K 4.2  CL 99  CO2 23  GLUCOSE 117*  BUN 29*  CREATININE 1.56*  CALCIUM  8.4*   Liver Function Tests: Recent Labs    10/13/23 0559  AST 22  ALT 22  ALKPHOS 56  BILITOT 1.3*  PROT 6.1*  ALBUMIN  3.5   No results for input(s): "LIPASE", "AMYLASE" in the last 72 hours. CBC: Recent Labs    10/13/23 0330 10/13/23 0855  WBC 7.2 7.0  NEUTROABS 5.5 5.3  HGB 12.6* 10.9*  HCT 36.7* 31.6*  MCV 112.9* 110.9*  PLT 131* 124*   Cardiac Enzymes: Recent Labs    10/13/23 0330 10/13/23 0559  TROPONINIHS 8 8   BNP: No results for input(s): "BNP" in the last 72 hours. D-Dimer: No results for input(s): "DDIMER" in the last 72 hours. Hemoglobin A1C: No results for input(s): "HGBA1C" in the last 72 hours. Fasting Lipid Panel: No results for input(s): "CHOL", "HDL", "LDLCALC", "TRIG", "CHOLHDL", "LDLDIRECT" in the last 72 hours. Thyroid Function Tests: No results for input(s): "TSH", "T4TOTAL", "T3FREE", "THYROIDAB" in the last 72  hours.  Invalid input(s): "FREET3" Anemia Panel: No results for input(s): "VITAMINB12", "FOLATE", "FERRITIN", "TIBC", "IRON", "RETICCTPCT" in the last 72 hours.   Radiology: DG Foot Complete Right Result Date: 10/13/2023 CLINICAL DATA:  Fall, right foot EXAM: RIGHT FOOT COMPLETE - 3+ VIEW COMPARISON:  None Available. FINDINGS: There is an acute fracture of the proximal diaphysis of the right fifth proximal phalanx with fracture fragments in near anatomic alignment. No other fracture. Dorsal lateral dislocation of the right second proximal phalanx. Remaining joint spaces are preserved. Small plantar calcaneal spur. Vascular calcifications. Mild soft tissue swelling of the dorsal right forefoot. IMPRESSION: 1. Acute fracture of the right fifth proximal phalanx. 2. Dorsal lateral dislocation of the right second proximal phalanx. Electronically Signed   By: Worthy Heads M.D.   On: 10/13/2023 04:44   DG Knee Complete 4 Views Right Result Date: 10/13/2023 CLINICAL DATA:  Fall, right knee pain EXAM: RIGHT KNEE - COMPLETE 4+ VIEW COMPARISON:  None Available. FINDINGS: Normal alignment. No acute fracture or dislocation. Mild tricompartmental degenerative arthritis, most severe within the patellofemoral compartment. Small right knee effusion. Vascular calcifications are noted. IMPRESSION: 1. Mild tricompartmental degenerative arthritis. Small right knee effusion. Electronically Signed   By: Worthy Heads M.D.   On: 10/13/2023 04:42   DG Knee Complete 4 Views Left Result Date: 10/13/2023 CLINICAL DATA:  Fall EXAM: LEFT KNEE - COMPLETE 4+ VIEW COMPARISON:  None Available. FINDINGS: Normal alignment. No acute fracture or dislocation. Mild tricompartmental degenerative arthritis, most severe within the patellofemoral compartment. Small effusion. Vascular calcifications are noted. Varicoid densities within the subcutaneous soft tissues are in keeping with venous varicosities. IMPRESSION: 1. Mild tricompartmental  degenerative arthritis. Small effusion. Electronically Signed   By: Worthy Heads M.D.   On: 10/13/2023 04:42   DG Hip Unilat W or Wo Pelvis 2-3 Views Left Result Date: 10/13/2023 CLINICAL DATA:  Fall EXAM: DG HIP (WITH OR WITHOUT PELVIS) 2-3V LEFT COMPARISON:  None Available. FINDINGS: Normal alignment. No acute fracture or dislocation. Mild left hip degenerative arthritis. Vascular calcifications are noted. IMPRESSION: 1. Mild left hip degenerative arthritis. Electronically Signed   By: Worthy Heads M.D.   On: 10/13/2023 04:25   CT Head Wo Contrast Result Date: 10/13/2023 EXAM: CT HEAD AND CERVICAL SPINE 10/13/2023 03:56:49 AM TECHNIQUE: CT  of the head and cervical spine was performed without the administration of intravenous contrast. Multiplanar reformatted images are provided for review. Automated exposure control, iterative reconstruction, and/or weight based adjustment of the mA/kV was utilized to reduce the radiation dose to as low as reasonably achievable. COMPARISON: None available. CLINICAL HISTORY: Head trauma, minor (Age >= 65y). Patient C/O three falls in the last twenty four hours. Patient states that twice the falls were syncopal episodes (he does have orthostatic hypotension), and one was a mechanical fall where he tripped. He does endorse hitting his head during one of the syncopal episodes, and is on eliquis . Patient is alert and oriented x4. He is now C/O left hip pain and bilateral knee pain from the most recent fall (around 0130 this morning). Also of note, patient has a pacemaker and he wonders if his episodes have anything to do with this. FINDINGS: HEAD: BRAIN AND VENTRICLES: There is no acute intracranial hemorrhage, mass effect or midline shift. No abnormal extra-axial fluid collection. The gray-white differentiation is maintained without an acute infarct. There is no hydrocephalus. Global cortical atrophy. Subcortical and periventricular small vessel ischemic changes. Intracranial  atherosclerosis. ORBITS: The visualized portion of the orbits demonstrate no acute abnormality. SINUSES: The visualized paranasal sinuses and mastoid air cells demonstrate no acute abnormality. SOFT TISSUES AND SKULL: No acute abnormality of the visualized skull or soft tissues. CERVICAL SPINE: BONES AND ALIGNMENT: There is no acute fracture or traumatic malalignment. DEGENERATIVE CHANGES: Mild degenerative changes at C5-6 and C6-7. SOFT TISSUES: There is no prevertebral soft tissue swelling. IMPRESSION: 1. No acute intracranial abnormality. 2. No acute fracture in the cervical spine. Electronically signed by: Zadie Herter MD 10/13/2023 04:01 AM EDT RP Workstation: [id number]   CT Cervical Spine Wo Contrast Result Date: 10/13/2023 EXAM: CT HEAD AND CERVICAL SPINE 10/13/2023 03:56:49 AM TECHNIQUE: CT of the head and cervical spine was performed without the administration of intravenous contrast. Multiplanar reformatted images are provided for review. Automated exposure control, iterative reconstruction, and/or weight based adjustment of the mA/kV was utilized to reduce the radiation dose to as low as reasonably achievable. COMPARISON: None available. CLINICAL HISTORY: Head trauma, minor (Age >= 65y). Patient C/O three falls in the last twenty four hours. Patient states that twice the falls were syncopal episodes (he does have orthostatic hypotension), and one was a mechanical fall where he tripped. He does endorse hitting his head during one of the syncopal episodes, and is on eliquis . Patient is alert and oriented x4. He is now C/O left hip pain and bilateral knee pain from the most recent fall (around 0130 this morning). Also of note, patient has a pacemaker and he wonders if his episodes have anything to do with this. FINDINGS: HEAD: BRAIN AND VENTRICLES: There is no acute intracranial hemorrhage, mass effect or midline shift. No abnormal extra-axial fluid collection. The gray-white differentiation is  maintained without an acute infarct. There is no hydrocephalus. Global cortical atrophy. Subcortical and periventricular small vessel ischemic changes. Intracranial atherosclerosis. ORBITS: The visualized portion of the orbits demonstrate no acute abnormality. SINUSES: The visualized paranasal sinuses and mastoid air cells demonstrate no acute abnormality. SOFT TISSUES AND SKULL: No acute abnormality of the visualized skull or soft tissues. CERVICAL SPINE: BONES AND ALIGNMENT: There is no acute fracture or traumatic malalignment. DEGENERATIVE CHANGES: Mild degenerative changes at C5-6 and C6-7. SOFT TISSUES: There is no prevertebral soft tissue swelling. IMPRESSION: 1. No acute intracranial abnormality. 2. No acute fracture in the cervical spine. Electronically signed by: Monna Antes  Lyndon Santiago MD 10/13/2023 04:01 AM EDT RP Workstation: [id number]    ECHO 09/29/2023 NORMAL LEFT VENTRICULAR SYSTOLIC FUNCTION WITH MODERATE LVH  ESTIMATED EF: >55%, CALC EF(2D): 54%  ELEVATED LA PRESSURES WITH DIASTOLIC DYSFUNCTION (GRADE 2)  NORMAL RIGHT VENTRICULAR SYSTOLIC FUNCTION  VALVULAR REGURGITATION: TRIVIAL AR, MILD MR, MILD PR, MILD TR  NO VALVULAR STENOSIS   TELEMETRY reviewed by me 10/13/2023: AV pacing 60s  EKG reviewed by me: AV paced, rate 67 bpm  Data reviewed by me 10/13/2023: last 24h vitals tele labs imaging I/O ED provider note, admission H&P.  Principal Problem:   Syncope Active Problems:   Sick sinus syndrome (HCC)   Bradycardia   NSTEMI (non-ST elevated myocardial infarction) (HCC)   Coronary artery disease due to lipid rich plaque   Dilated cardiomyopathy (HCC)   Essential hypertension   Paroxysmal atrial fibrillation (HCC)   Stage 3b chronic kidney disease (HCC)   SSS (sick sinus syndrome) (HCC)   Hypothyroidism   Closed dislocation of second toe of right foot    ASSESSMENT AND PLAN:  Mike Townsend is a 78 y.o. male  with a past medical history of SSS, DCM with improved pEF s/p CRT-P  (upgraded from micra 01/2023), paroxysmal atrial fibrillation (recent DCCV 06/27/23 converted to AV pacing), coronary artery disease, hyperlipidemia, hx hypotension, CKD stage 3b  who presented to the ED on 10/13/2023 for syncope. Cardiology was consulted for further evaluation.   # Vasovagal and Orthostatic syncope  # SSS s/p (upgraded to CRT-P Medtronic 01/2023) # Paroxymal atrial fibrillation # Dilated Cardiomyopathy (improved pEF) # Coronary artery disease # Hx of hypotension Patient with hx of hypotension and past syncopal episodes presents after 2 recent episodes of syncope without chest pain, SOB or palpitations. Prior Echo from 09/29/23 with pEF (>55%), mild MR, mild TR,mod concentric LVH, elevated LA pressures with grade 2 diastolic dysfxn. CRT-P device interrogated on 09/29/23 and 06/06 with no significant events or arrhthymias. Trops negative x2. EKG in ED AV-paced, rate 67 bpm.   -Monitor and replenish electrolytes for a goal K >4, Mag >2  -Recommend compression stockings and adequate hydration.  -Discontinue coreg 3.125 twice daily. Do not continue coreg at discharge.  -Continue home amio 100 mg daily. -Continue Eliquis  5 mg twice daily for stroke risk reduction. -Continue rosuvastatin  20 mg daily.   No further recommendations from cardiac perspective. Cardiology will sign off. Please haiku with questions or re-engage if needed.   This patient's plan of care was discussed and created with Dr. Bob Burn and he is in agreement.  Signed: Creighton Doffing, PA-C  10/13/2023, 10:02 AM [facility]

## 2023-10-13 NOTE — ED Triage Notes (Signed)
 EMS brings pt in from home where he had syncopal episode; c/o pain left hip

## 2023-10-13 NOTE — ED Triage Notes (Addendum)
 Patient C/O three falls in the last twenty four hours. Patient states that twice the falls were syncopal episodes (he does have orthostatic hypotension), and one was a mechanical fall where he tripped. He does endorse hitting his head during one of the syncopal episodes, and is on eliquis . Patient is alert and oriented x4. He is now C/O left hip pain and bilateral knee pain from the most recent fall (around 0130 this morning). Also of note, patient has a pacemaker and he wonders if his episodes have anything to do with this.

## 2023-10-13 NOTE — Evaluation (Addendum)
 Occupational Therapy Evaluation Patient Details Name: Mike Townsend MRN: [id number] DOB: February 09, 1946 Today's Date: 10/13/2023   History of Present Illness   Pt is a 78 year old presenting with syncope, imaging also found R 5th phalanx proximal fracture, Dorsal dislocation right second proximal phalanx    PMH significant for sick sinus s/p pacemaker, a fib, cad, dchf, htn, ckd 3b     Clinical Impressions Chart reviewed, pt greeted semi supine in bed, alert and oriented x4, agreeable to OT evaluation. Co tx completed with PT on this date. PTA reports he is MOD I-I in ADL/IADL, amb with no AD. Post op shoe donned during attempted mobility. MAX A required for donning R post op shoe, MIN A for L slip on shoe. Orthostatic vital signs monitored throughout, BP in sitting 51/35, then 77/45 with pt initially not reporting symptoms, but then reports symptoms after approx 30 seconds. BP 117/67 after return to supine. R lateral scoots up the bed completed with supervision, anticipate pt will benefit from initial RW use when safe to attempt further mobility. Further mobility deferred on this date. Educated pt re: role of OT, role of rehab, discharge recommendations, compensatory techniques/lifestyle modifications for safe ADL/functional mobility completion. Notified MD/nurse re: pt status. Pt is left semi supine in bed, all needs met. OT will follow acutely to facilitate optimal ADL performance.      If plan is discharge home, recommend the following:   A little help with walking and/or transfers;A little help with bathing/dressing/bathroom;Help with stairs or ramp for entrance;Assistance with cooking/housework     Functional Status Assessment   Patient has had a recent decline in their functional status and demonstrates the ability to make significant improvements in function in a reasonable and predictable amount of time.     Equipment Recommendations   BSC/3in1;Other (comment) (2WW)      Recommendations for Other Services         Precautions/Restrictions   Precautions Precautions: Fall Recall of Precautions/Restrictions: Intact Restrictions Weight Bearing Restrictions Per Provider Order: Yes RLE Weight Bearing Per Provider Order: Weight bearing as tolerated (in post op shoe)     Mobility Bed Mobility Overal bed mobility: Needs Assistance Bed Mobility: Supine to Sit     Supine to sit: Contact guard, HOB elevated, Used rails     General bed mobility comments: from ED stretcher, increased time    Transfers                   General transfer comment: lateral scoot up the bed to the R with supervision      Balance Overall balance assessment: Needs assistance Sitting-balance support: Feet supported Sitting balance-Leahy Scale: Good                                     ADL either performed or assessed with clinical judgement   ADL Overall ADL's : Needs assistance/impaired     Grooming: Set up Grooming Details (indicate cue type and reason): anticipate             Lower Body Dressing: Maximal assistance Lower Body Dressing Details (indicate cue type and reason): post op shoe; MIN A for L croc   Toilet Transfer Details (indicate cue type and reason): unsafe to attempt at this time due to +orthostatic                 Vision Patient Visual Report: No  change from baseline       Perception         Praxis         Pertinent Vitals/Pain Pain Assessment Pain Assessment: Faces Faces Pain Scale: Hurts even more Pain Location: R foot Pain Descriptors / Indicators: Discomfort Pain Intervention(s): Monitored during session, Repositioned     Extremity/Trunk Assessment Upper Extremity Assessment Upper Extremity Assessment: Defer to OT evaluation   Lower Extremity Assessment Lower Extremity Assessment: Generalized weakness       Communication Communication Communication: No apparent difficulties    Cognition Arousal: Alert Behavior During Therapy: WFL for tasks assessed/performed Cognition: No apparent impairments                               Following commands: Intact       Cueing  General Comments   Cueing Techniques: Verbal cues  +orthostatics, deferred further mobility   Exercises Other Exercises Other Exercises: edu re: role of OT, role of rehab, safe ADL completion, lifestyle modifications/compensatory techniques for safe ADL completion   Shoulder Instructions      Home Living Family/patient expects to be discharged to:: Private residence Living Arrangements: Spouse/significant other Available Help at Discharge: Family;Available 24 hours/day Type of Home: House Home Access: Level entry     Home Layout: One level     Bathroom Shower/Tub: Walk-in shower (threshold 4 inches)   Bathroom Toilet: Handicapped height     Home Equipment: Cane - single point;Shower seat - built in (hiking stair)          Prior Functioning/Environment Prior Level of Function : Independent/Modified Independent             Mobility Comments: amb with no AD, recently went on a multi week trip to [place] ADLs Comments: MOD I-I in ADL/IADL    OT Problem List: Decreased activity tolerance;Decreased knowledge of use of DME or AE   OT Treatment/Interventions: Self-care/ADL training;DME and/or AE instruction;Therapeutic activities;Therapeutic exercise;Balance training;Patient/family education      OT Goals(Current goals can be found in the care plan section)   Acute Rehab OT Goals Patient Stated Goal: go home, figure it out OT Goal Formulation: With patient Time For Goal Achievement: 10/27/23 Potential to Achieve Goals: Good ADL Goals Pt Will Perform Grooming: with modified independence;sitting;standing Pt Will Perform Lower Body Dressing: with modified independence;sit to/from stand;sitting/lateral leans Pt Will Transfer to Toilet: with modified  independence;ambulating Pt Will Perform Toileting - Clothing Manipulation and hygiene: with modified independence;sit to/from stand;sitting/lateral leans   OT Frequency:  Min 2X/week    Co-evaluation PT/OT/SLP Co-Evaluation/Treatment: Yes Reason for Co-Treatment: For patient/therapist safety;To address functional/ADL transfers PT goals addressed during session: Mobility/safety with mobility;Proper use of DME OT goals addressed during session: ADL's and self-care      AM-PAC OT "6 Clicks" Daily Activity     Outcome Measure Help from another person eating meals?: None Help from another person taking care of personal grooming?: None Help from another person toileting, which includes using toliet, bedpan, or urinal?: A Little Help from another person bathing (including washing, rinsing, drying)?: A Little Help from another person to put on and taking off regular upper body clothing?: None Help from another person to put on and taking off regular lower body clothing?: A Little 6 Click Score: 21   End of Session Equipment Utilized During Treatment: Gait belt Nurse Communication: Other (comment) (nurse/MD re: vitals, nurse re: pain report via secure chat)  Activity Tolerance: Other (comment) (limited by symptomatic orthostatic vital signs) Patient left: in bed;with call bell/phone within reach  OT Visit Diagnosis: Other abnormalities of gait and mobility (R26.89)                Time: 1610-9604 OT Time Calculation (min): 23 min Charges:  OT General Charges $OT Visit: 1 Visit OT Evaluation $OT Eval Moderate Complexity: 1 Mod  Gerre Kraft, OTD OTR/L  10/13/23, 3:24 PM

## 2023-10-13 NOTE — H&P (Signed)
 History and Physical    Mike Townsend ZOX:[id number] DOB: 08-03-1945 DOA: 10/13/2023  PCP: [name], MD  Patient coming from: cardiology   Chief Complaint: syncope  HPI: Mike Townsend is a 78 y.o. male with medical history significant for sick sinus s/p pacemaker, a fib, cad, dchf, htn, ckd 3b, presenting with the above.  Chronic problem, happens from time to time, but happened twice in past two days, fell and hurt toes and knees and left hip today. Always happens when standing, today shortly after getting out of bed, yesterday while standing urinating. Gets lightheaded. No chest pain or palpitations. No recent illness, no fever, nausea, vomiting, or diarrhea. No head injury    Review of Systems: As per HPI otherwise 10 point review of systems negative.    Past Medical History:  Diagnosis Date   CHF (congestive heart failure) (HCC)    Dysrhythmia    A-Fib   Gout    Hyperlipidemia    Hypertension    Paroxysmal atrial fibrillation (HCC)    Presence of permanent cardiac pacemaker    Medtronic Micra AV  [id number]   Transfusion history    Wrist fracture 12/2019   left    Past Surgical History:  Procedure Laterality Date   CARDIAC CATHETERIZATION     CARDIAC ELECTROPHYSIOLOGY STUDY AND ABLATION     CATARACT EXTRACTION W/PHACO Right 12/23/2019   Procedure: CATARACT EXTRACTION PHACO AND INTRAOCULAR LENS PLACEMENT (IOC) RIGHT  PANOPTIC TORIC LENS;  Surgeon: [name], MD;  Location: [facility];  Service: Ophthalmology;  Laterality: Right;  4.70 0:34.7   CATARACT EXTRACTION W/PHACO Left 01/20/2020   Procedure: CATARACT EXTRACTION PHACO AND INTRAOCULAR LENS PLACEMENT (IOC) LEFT PANOPTIX LENS;  Surgeon: [name], MD;  Location: [facility];  Service: Ophthalmology;  Laterality: Left;  10.12 1:14   LAPAROSCOPIC GASTRIC BYPASS     LEFT HEART CATH AND CORONARY ANGIOGRAPHY N/A 10/24/2018   Procedure: LEFT HEART CATH AND CORONARY ANGIOGRAPHY;   Surgeon: [name], MD;  Location: [facility];  Service: Cardiovascular;  Laterality: N/A;   LUMBAR LAMINECTOMY     PACEMAKER INSERTION N/A 03/22/2016   Procedure: INSERTION PACEMAKER;  Surgeon: [name], MD;  Location: [facility];  Service: Cardiovascular;  Laterality: N/A;   TONSILLECTOMY       reports that he quit smoking about 4 years ago. His smoking use included pipe. He has never used smokeless tobacco. He reports current alcohol use. He reports that he does not use drugs.  No Known Allergies  Family History  Problem Relation Age of Onset   CAD Father     Prior to Admission medications   Medication Sig Start Date End Date Taking? Authorizing Provider  acetaminophen  (TYLENOL ) 325 MG tablet Take 2 tablets (650 mg total) by mouth every 6 (six) hours as needed for mild pain or moderate pain (or Fever >/= 101). 10/25/18   Gouru, Aruna, MD  allopurinol  (ZYLOPRIM ) 300 MG tablet Take 300 mg by mouth daily.    [provider]  amiodarone  (PACERONE ) 200 MG tablet Take 100 mg by mouth daily.     [provider]  ELIQUIS  5 MG TABS tablet Take 5 mg by mouth 2 (two) times a day. 10/15/18   [provider]  losartan  (COZAAR ) 25 MG tablet Take 25 mg by mouth daily.    [provider]  magnesium  oxide (MAG-OX) 400 MG tablet Take 400 mg by mouth daily.    [provider]  metoprolol  succinate (TOPROL -XL) 50 MG 24 hr tablet Take 1 tablet (50 mg total) by mouth daily. Take with or immediately following a meal. Patient taking differently: Take 25 mg by mouth daily. Take with or immediately following a meal. 10/26/18   Gouru, Aruna, MD  rosuvastatin  (CRESTOR ) 20 MG tablet Take 20 mg by mouth daily.    [provider]  Saw Palmetto , Serenoa repens, (SAW PALMETTO  PO) Take 1 capsule by mouth daily.    [provider]    Physical Exam: Vitals:   10/13/23 0310 10/13/23 0313 10/13/23 0325 10/13/23 0700  BP:  134/83   125/77  Pulse:  60  (!) 59  Resp:  18  16  Temp:  97.7 F (36.5 C)    TempSrc:  Oral    SpO2: 100% 100%  100%  Weight:   106.6 kg   Height:   6\' 2"  (1.88 m)     Constitutional: No acute distress Head: Atraumatic Eyes: Conjunctiva clear ENM: Moist mucous membranes. Normal dentition.  Neck: Supple Respiratory: Clear to auscultation bilaterally, no wheezing/rales/rhonchi. Normal respiratory effort. No accessory muscle use. . Cardiovascular: Regular rate and rhythm. No murmurs/rubs/gallops. Abdomen: Non-tender, non-distended. No masses. No rebound or guarding. Positive bowel sounds. Musculoskeletal: No joint deformity upper and lower extremities. Normal ROM, no contractures. Normal muscle tone.  Skin: No rashes, lesions, or ulcers.  Extremities: No peripheral edema. Palpable peripheral pulses. Neurologic: Alert, moving all 4 extremities. Psychiatric: Normal insight and judgement.   Labs on Admission: I have personally reviewed following labs and imaging studies  CBC: Recent Labs  Lab 10/13/23 0330  WBC 7.2  NEUTROABS 5.5  HGB 12.6*  HCT 36.7*  MCV 112.9*  PLT 131*   Basic Metabolic Panel: Recent Labs  Lab 10/13/23 0559  NA 129*  K 4.2  CL 99  CO2 23  GLUCOSE 117*  BUN 29*  CREATININE 1.56*  CALCIUM  8.4*   GFR: Estimated Creatinine Clearance: 52.4 mL/min (A) (by C-G formula based on SCr of 1.56 mg/dL (H)). Liver Function Tests: Recent Labs  Lab 10/13/23 0559  AST 22  ALT 22  ALKPHOS 56  BILITOT 1.3*  PROT 6.1*  ALBUMIN  3.5   No results for input(s): "LIPASE", "AMYLASE" in the last 168 hours. No results for input(s): "AMMONIA" in the last 168 hours. Coagulation Profile: No results for input(s): "INR", "PROTIME" in the last 168 hours. Cardiac Enzymes: No results for input(s): "CKTOTAL", "CKMB", "CKMBINDEX", "TROPONINI" in the last 168 hours. BNP (last 3 results) No results for input(s): "PROBNP" in the last 8760 hours. HbA1C: No results for input(s):  "HGBA1C" in the last 72 hours. CBG: No results for input(s): "GLUCAP" in the last 168 hours. Lipid Profile: No results for input(s): "CHOL", "HDL", "LDLCALC", "TRIG", "CHOLHDL", "LDLDIRECT" in the last 72 hours. Thyroid Function Tests: No results for input(s): "TSH", "T4TOTAL", "FREET4", "T3FREE", "THYROIDAB" in the last 72 hours. Anemia Panel: No results for input(s): "VITAMINB12", "FOLATE", "FERRITIN", "TIBC", "IRON", "RETICCTPCT" in the last 72 hours. Urine analysis: No results found for: "COLORURINE", "APPEARANCEUR", "LABSPEC", "PHURINE", "GLUCOSEU", "HGBUR", "BILIRUBINUR", "KETONESUR", "PROTEINUR", "UROBILINOGEN", "NITRITE", "LEUKOCYTESUR"  Radiological Exams on Admission: DG Foot Complete Right Result Date: 10/13/2023 CLINICAL DATA:  Fall, right foot EXAM: RIGHT FOOT COMPLETE - 3+ VIEW COMPARISON:  None Available. FINDINGS: There is an acute fracture of the proximal diaphysis of the right fifth proximal phalanx with fracture fragments in near anatomic alignment. No other fracture. Dorsal lateral dislocation of the right second proximal phalanx. Remaining joint spaces are preserved.  Small plantar calcaneal spur. Vascular calcifications. Mild soft tissue swelling of the dorsal right forefoot. IMPRESSION: 1. Acute fracture of the right fifth proximal phalanx. 2. Dorsal lateral dislocation of the right second proximal phalanx. Electronically Signed   By: Worthy Heads M.D.   On: 10/13/2023 04:44   DG Knee Complete 4 Views Right Result Date: 10/13/2023 CLINICAL DATA:  Fall, right knee pain EXAM: RIGHT KNEE - COMPLETE 4+ VIEW COMPARISON:  None Available. FINDINGS: Normal alignment. No acute fracture or dislocation. Mild tricompartmental degenerative arthritis, most severe within the patellofemoral compartment. Small right knee effusion. Vascular calcifications are noted. IMPRESSION: 1. Mild tricompartmental degenerative arthritis. Small right knee effusion. Electronically Signed   By: Worthy Heads  M.D.   On: 10/13/2023 04:42   DG Knee Complete 4 Views Left Result Date: 10/13/2023 CLINICAL DATA:  Fall EXAM: LEFT KNEE - COMPLETE 4+ VIEW COMPARISON:  None Available. FINDINGS: Normal alignment. No acute fracture or dislocation. Mild tricompartmental degenerative arthritis, most severe within the patellofemoral compartment. Small effusion. Vascular calcifications are noted. Varicoid densities within the subcutaneous soft tissues are in keeping with venous varicosities. IMPRESSION: 1. Mild tricompartmental degenerative arthritis. Small effusion. Electronically Signed   By: Worthy Heads M.D.   On: 10/13/2023 04:42   DG Hip Unilat W or Wo Pelvis 2-3 Views Left Result Date: 10/13/2023 CLINICAL DATA:  Fall EXAM: DG HIP (WITH OR WITHOUT PELVIS) 2-3V LEFT COMPARISON:  None Available. FINDINGS: Normal alignment. No acute fracture or dislocation. Mild left hip degenerative arthritis. Vascular calcifications are noted. IMPRESSION: 1. Mild left hip degenerative arthritis. Electronically Signed   By: Worthy Heads M.D.   On: 10/13/2023 04:25   CT Head Wo Contrast Result Date: 10/13/2023 EXAM: CT HEAD AND CERVICAL SPINE 10/13/2023 03:56:49 AM TECHNIQUE: CT of the head and cervical spine was performed without the administration of intravenous contrast. Multiplanar reformatted images are provided for review. Automated exposure control, iterative reconstruction, and/or weight based adjustment of the mA/kV was utilized to reduce the radiation dose to as low as reasonably achievable. COMPARISON: None available. CLINICAL HISTORY: Head trauma, minor (Age >= 65y). Patient C/O three falls in the last twenty four hours. Patient states that twice the falls were syncopal episodes (he does have orthostatic hypotension), and one was a mechanical fall where he tripped. He does endorse hitting his head during one of the syncopal episodes, and is on eliquis . Patient is alert and oriented x4. He is now C/O left hip pain and bilateral  knee pain from the most recent fall (around 0130 this morning). Also of note, patient has a pacemaker and he wonders if his episodes have anything to do with this. FINDINGS: HEAD: BRAIN AND VENTRICLES: There is no acute intracranial hemorrhage, mass effect or midline shift. No abnormal extra-axial fluid collection. The gray-white differentiation is maintained without an acute infarct. There is no hydrocephalus. Global cortical atrophy. Subcortical and periventricular small vessel ischemic changes. Intracranial atherosclerosis. ORBITS: The visualized portion of the orbits demonstrate no acute abnormality. SINUSES: The visualized paranasal sinuses and mastoid air cells demonstrate no acute abnormality. SOFT TISSUES AND SKULL: No acute abnormality of the visualized skull or soft tissues. CERVICAL SPINE: BONES AND ALIGNMENT: There is no acute fracture or traumatic malalignment. DEGENERATIVE CHANGES: Mild degenerative changes at C5-6 and C6-7. SOFT TISSUES: There is no prevertebral soft tissue swelling. IMPRESSION: 1. No acute intracranial abnormality. 2. No acute fracture in the cervical spine. Electronically signed by: Zadie Herter MD 10/13/2023 04:01 AM EDT RP Workstation: [id number]  CT Cervical Spine Wo Contrast Result Date: 10/13/2023 EXAM: CT HEAD AND CERVICAL SPINE 10/13/2023 03:56:49 AM TECHNIQUE: CT of the head and cervical spine was performed without the administration of intravenous contrast. Multiplanar reformatted images are provided for review. Automated exposure control, iterative reconstruction, and/or weight based adjustment of the mA/kV was utilized to reduce the radiation dose to as low as reasonably achievable. COMPARISON: None available. CLINICAL HISTORY: Head trauma, minor (Age >= 65y). Patient C/O three falls in the last twenty four hours. Patient states that twice the falls were syncopal episodes (he does have orthostatic hypotension), and one was a mechanical fall where he tripped. He  does endorse hitting his head during one of the syncopal episodes, and is on eliquis . Patient is alert and oriented x4. He is now C/O left hip pain and bilateral knee pain from the most recent fall (around 0130 this morning). Also of note, patient has a pacemaker and he wonders if his episodes have anything to do with this. FINDINGS: HEAD: BRAIN AND VENTRICLES: There is no acute intracranial hemorrhage, mass effect or midline shift. No abnormal extra-axial fluid collection. The gray-white differentiation is maintained without an acute infarct. There is no hydrocephalus. Global cortical atrophy. Subcortical and periventricular small vessel ischemic changes. Intracranial atherosclerosis. ORBITS: The visualized portion of the orbits demonstrate no acute abnormality. SINUSES: The visualized paranasal sinuses and mastoid air cells demonstrate no acute abnormality. SOFT TISSUES AND SKULL: No acute abnormality of the visualized skull or soft tissues. CERVICAL SPINE: BONES AND ALIGNMENT: There is no acute fracture or traumatic malalignment. DEGENERATIVE CHANGES: Mild degenerative changes at C5-6 and C6-7. SOFT TISSUES: There is no prevertebral soft tissue swelling. IMPRESSION: 1. No acute intracranial abnormality. 2. No acute fracture in the cervical spine. Electronically signed by: Zadie Herter MD 10/13/2023 04:01 AM EDT RP Workstation: [id number]    EKG: Independently reviewed. Av paced rhythm  Assessment/Plan Principal Problem:   Syncope Active Problems:   Sick sinus syndrome (HCC)   Bradycardia   NSTEMI (non-ST elevated myocardial infarction) (HCC)   Coronary artery disease due to lipid rich plaque   Dilated cardiomyopathy (HCC)   Essential hypertension   Paroxysmal atrial fibrillation (HCC)   Stage 3b chronic kidney disease (HCC)   SSS (sick sinus syndrome) (HCC)   Hypothyroidism   # Syncope Appears to be orthostatic. Is on metop and losartan . Longstanding issue, but more frequent as of late.  TTE last month so holding on repeating. Probably needs pacemaker interrogation. Says has compression stockings, doesn't sound like he's tried an abdominal binder - cardiology consult - maintain tele - hold home losartan  for now  # Fall # right 5th phalanx proximal fracture # Dorsal dislocation right second proximal phalanx - ortho consulted - pt/ot  # HTN Bp appropriate - losartan  on hold - cont metop for now  # A-fib Av paced rhythm, s/p multiple recent cardioversions - continue home metop and amio for now - continue apixaban   # Sick sinus S/p pacer - interrogation as above  # Hypothyroid - on synthroid at home? Med rec pending  # HFpEF Euvolemic - home metop  # Hyponatremia Appears euvolemic - tsh, cortisol, serum and urine osm, urine sodium  # CKD 3a At baseline - monitor  # Macrocytosis - b12, folate  # Thrombocytopenia - hiv, hcv, smear  DVT prophylaxis: apixaban  Code Status: full Family Communication: wife at bedside  Consults called: podiatry, cardiology   Level of care: Telemetry Cardiac Status is: Observation The patient remains OBS appropriate and  will d/c before 2 midnights.    Raymonde Calico MD [facility] Pager [phone]  If 7PM-7AM, please contact night-coverage [email or web address] Password [facility]  10/13/2023, 8:13 AM  ]

## 2023-10-13 NOTE — Evaluation (Signed)
 Physical Therapy Evaluation Patient Details Name: Mike Townsend MRN: [id number] DOB: [birth year] Today's Date: 10/13/2023  History of Present Illness  Pt is a 78 year old presenting with syncope, imaging also found R 5th phalanx proximal fracture, Dorsal dislocation right second proximal phalanx    PMH significant for sick sinus s/p pacemaker, a fib, cad, dchf, htn, ckd 3b  Clinical Impression  Patient admitted with the above. PTA, patient lives with wife and is independent with mobility with no AD. Post op shoe donned during attempted mobility. Orthostatics obtained during session, see below. Initially asymptomatic, but after ~30 seconds, patient experiencing mild symptoms. Able to laterally scoot towards HOB to R with supervision. Anticipate patient will likely not need follow up PT once BP is managed. Patient will benefit from skilled PT services during acute stay to address listed deficits.      Orthostatic BPs  Supine 108/73  Sitting 110/78  Sitting after 1-2 min 51/35  Sitting after 3-4 min 77/45  Supine 117/67      If plan is discharge home, recommend the following: A little help with walking and/or transfers;A little help with bathing/dressing/bathroom   Can travel by private vehicle        Equipment Recommendations Rolling Sherina Stammer (2 wheels)  Recommendations for Other Services       Functional Status Assessment Patient has had a recent decline in their functional status and demonstrates the ability to make significant improvements in function in a reasonable and predictable amount of time.     Precautions / Restrictions Precautions Precautions: Fall Recall of Precautions/Restrictions: Intact Restrictions Weight Bearing Restrictions Per Provider Order: No      Mobility  Bed Mobility Overal bed mobility: Needs Assistance Bed Mobility: Supine to Sit     Supine to sit: Contact guard, HOB elevated, Used rails          Transfers                    General transfer comment: lateral scoot up the bed to the R with supervision    Ambulation/Gait                  Stairs            Wheelchair Mobility     Tilt Bed    Modified Rankin (Stroke Patients Only)       Balance Overall balance assessment: Needs assistance Sitting-balance support: Feet supported Sitting balance-Leahy Scale: Good                                       Pertinent Vitals/Pain Pain Assessment Pain Assessment: Faces Faces Pain Scale: Hurts even more Pain Location: R foot Pain Descriptors / Indicators: Discomfort Pain Intervention(s): Monitored during session, Repositioned    Home Living Family/patient expects to be discharged to:: Private residence Living Arrangements: Spouse/significant other Available Help at Discharge: Family;Available 24 hours/day Type of Home: House Home Access: Level entry       Home Layout: One level Home Equipment: Cane - single point;Shower seat - built in (hiking stair)      Prior Function Prior Level of Function : Independent/Modified Independent             Mobility Comments: amb with no AD, recently went on a multi week trip to [place] ADLs Comments: MOD I-I in ADL/IADL     Extremity/Trunk Assessment   Upper Extremity Assessment  Upper Extremity Assessment: Defer to OT evaluation    Lower Extremity Assessment Lower Extremity Assessment: Generalized weakness       Communication   Communication Communication: No apparent difficulties    Cognition Arousal: Alert Behavior During Therapy: WFL for tasks assessed/performed   PT - Cognitive impairments: No apparent impairments                         Following commands: Intact       Cueing       General Comments General comments (skin integrity, edema, etc.): +orthostatics, deferred further mobility    Exercises     Assessment/Plan    PT Assessment Patient needs continued PT services  PT Problem  List Decreased strength;Decreased activity tolerance;Decreased balance;Decreased mobility;Decreased safety awareness;Decreased knowledge of use of DME;Decreased knowledge of precautions;Cardiopulmonary status limiting activity       PT Treatment Interventions Gait training;DME instruction;Functional mobility training;Therapeutic activities;Balance training;Therapeutic exercise;Patient/family education    PT Goals (Current goals can be found in the Care Plan section)  Acute Rehab PT Goals Patient Stated Goal: to go home PT Goal Formulation: With patient Time For Goal Achievement: 10/27/23 Potential to Achieve Goals: Good    Frequency Min 2X/week     Co-evaluation PT/OT/SLP Co-Evaluation/Treatment: Yes Reason for Co-Treatment: For patient/therapist safety;To address functional/ADL transfers PT goals addressed during session: Mobility/safety with mobility;Proper use of DME OT goals addressed during session: ADL's and self-care       AM-PAC PT "6 Clicks" Mobility  Outcome Measure Help needed turning from your back to your side while in a flat bed without using bedrails?: A Little Help needed moving from lying on your back to sitting on the side of a flat bed without using bedrails?: A Little Help needed moving to and from a bed to a chair (including a wheelchair)?: A Little Help needed standing up from a chair using your arms (e.g., wheelchair or bedside chair)?: A Little Help needed to walk in hospital room?: A Lot Help needed climbing 3-5 steps with a railing? : A Lot 6 Click Score: 16    End of Session   Activity Tolerance: Patient tolerated treatment well Patient left: in bed;with call bell/phone within reach Nurse Communication: Mobility status PT Visit Diagnosis: Unsteadiness on feet (R26.81);Muscle weakness (generalized) (M62.81)    Time: 1610-9604 PT Time Calculation (min) (ACUTE ONLY): 23 min   Charges:   PT Evaluation $PT Eval Moderate Complexity: 1 Mod   PT  General Charges $$ ACUTE PT VISIT: 1 Visit         Janine Melbourne, PT, DPT Physical Therapist - [facility]  [facility]   Imojean Yoshino A Octavie Westerhold 10/13/2023, 3:24 PM

## 2023-10-13 NOTE — Consult Note (Signed)
 PODIATRY CONSULTATION  NAME Mike Townsend MRN [id number] DOB [birth year] DOA 10/13/2023   Reason for consult: Right 2nd toe dislocation and 5th toe fx  Attending/Consulting physician: Joie Narrow MD  History of present illness: "Mike Townsend is a 78 y.o. male with medical history significant for sick sinus s/p pacemaker, a fib, cad, dchf, htn, ckd 3b, presenting with the above.   Chronic problem, happens from time to time, but happened twice in past two days, fell and hurt toes and knees and left hip today. Always happens when standing, today shortly after getting out of bed, yesterday while standing urinating. Gets lightheaded. No chest pain or palpitations. No recent illness, no fever, nausea, vomiting, or diarrhea. No head injury"  States he does have sensation in the  2nd toe distally. Able to wiggle the toes. Discussed plan for conservative management and he is in agreement. Discussed follow up plan and what to watch out for.   Past Medical History:  Diagnosis Date   CHF (congestive heart failure) (HCC)    Dysrhythmia    A-Fib   Gout    Hyperlipidemia    Hypertension    Paroxysmal atrial fibrillation (HCC)    Presence of permanent cardiac pacemaker    Medtronic Micra AV  [id number]   Transfusion history    Wrist fracture 12/2019   left       Latest Ref Rng & Units 10/13/2023    3:30 AM 03/20/2023    8:37 AM 10/25/2018    4:30 AM  CBC  WBC 4.0 - 10.5 K/uL 7.2  6.5  9.2   Hemoglobin 13.0 - 17.0 g/dL 08.6  57.8  46.9   Hematocrit 39.0 - 52.0 % 36.7  37.3  33.1   Platelets 150 - 400 K/uL 131  182  144        Latest Ref Rng & Units 10/13/2023    5:59 AM 10/25/2018    4:30 AM 10/23/2018    4:15 AM  BMP  Glucose 70 - 99 mg/dL 629  528  413   BUN 8 - 23 mg/dL 29  22  34   Creatinine 0.61 - 1.24 mg/dL 2.44  0.10  2.72   Sodium 135 - 145 mmol/L 129  136  133   Potassium 3.5 - 5.1 mmol/L 4.2  3.9  4.3   Chloride 98 - 111 mmol/L 99  111  107   CO2 22 - 32 mmol/L 23  18  19     Calcium  8.9 - 10.3 mg/dL 8.4  7.7  8.1       Physical Exam: Lower Extremity Exam Right foot edema and ecchymosis of 2nd and 5th toe primarily worst at 2nd toe in regards to ecchymosis  Sensation is intact to all toes to light touch, able to wiggle toes with pain,   Cap refill <3 sec to tip of 2nd and 5th toe  Mild lateral displacement of distal aspect of 2nd toe though not severe      ASSESSMENT/PLAN OF CARE 78 y.o. male with PMHx significant for  sick sinus s/p pacemaker, a fib, cad, dchf, htn, ckd 3b  with Righ 2nd toe mild PIPJ dorsolateral dislocation s/p reduction attempt per EDP, also with nondisplaced 5th proximal phalanx fx  - Non operative management no further reduction to be attempted, s/p reduction attempt per EDP and the 2nd toe is NV intact. Monitor coloration of the toe. Elevate the foot, do not place ice on the toe but ok to do ice  at ankle or behind knee.  - Coban buddy tape the 2nd toe hallux with gauze 2x2 in between toes, change daily or PRN - No abx indicated - Wound care: none required - WB status: WBAT in post op shoe to right foot - Follow up in 2 weeks in office for recheck. Discussed pt to monitor for worsening discoloration of toe as well as if he looses sensation to the tip of the toe to call office or come to ED. Will sign off please message with questions /concerns.    Thank you for the consult.  Please contact me directly with any questions or concerns.           Maridee Shoemaker, DPM [facility]    [address] [place], [place] [address]                Office [phone]  Fax [phone]

## 2023-10-14 ENCOUNTER — Encounter: Payer: Self-pay | Admitting: Obstetrics and Gynecology

## 2023-10-14 ENCOUNTER — Observation Stay

## 2023-10-14 DIAGNOSIS — R55 Syncope and collapse: Secondary | ICD-10-CM | POA: Diagnosis not present

## 2023-10-14 LAB — BASIC METABOLIC PANEL WITH GFR
Anion gap: 6 (ref 5–15)
BUN: 24 mg/dL — ABNORMAL HIGH (ref 8–23)
CO2: 24 mmol/L (ref 22–32)
Calcium: 8.4 mg/dL — ABNORMAL LOW (ref 8.9–10.3)
Chloride: 99 mmol/L (ref 98–111)
Creatinine, Ser: 1.47 mg/dL — ABNORMAL HIGH (ref 0.61–1.24)
GFR, Estimated: 49 mL/min — ABNORMAL LOW (ref >60)
Glucose, Bld: 102 mg/dL — ABNORMAL HIGH (ref 70–99)
Potassium: 4.3 mmol/L (ref 3.5–5.1)
Sodium: 129 mmol/L — ABNORMAL LOW (ref 135–145)

## 2023-10-14 LAB — CORTISOL-AM, BLOOD: Cortisol - AM: 14.7 ug/dL (ref 6.7–22.6)

## 2023-10-14 LAB — CBC
HCT: 31 % — ABNORMAL LOW (ref 39.0–52.0)
Hemoglobin: 10.6 g/dL — ABNORMAL LOW (ref 13.0–17.0)
MCH: 38.3 pg — ABNORMAL HIGH (ref 26.0–34.0)
MCHC: 34.2 g/dL (ref 30.0–36.0)
MCV: 111.9 fL — ABNORMAL HIGH (ref 80.0–100.0)
Platelets: 119 10*3/uL — ABNORMAL LOW (ref 150–400)
RBC: 2.77 MIL/uL — ABNORMAL LOW (ref 4.22–5.81)
RDW: 14.7 % (ref 11.5–15.5)
WBC: 7.5 10*3/uL (ref 4.0–10.5)
nRBC: 0 % (ref 0.0–0.2)

## 2023-10-14 LAB — HEMOGLOBIN: Hemoglobin: 9.5 g/dL — ABNORMAL LOW (ref 13.0–17.0)

## 2023-10-14 LAB — HIV ANTIBODY (ROUTINE TESTING W REFLEX): HIV Screen 4th Generation wRfx: NONREACTIVE

## 2023-10-14 MED ORDER — TRANEXAMIC ACID-NACL 1000-0.7 MG/100ML-% IV SOLN
1000.0000 mg | Freq: Once | INTRAVENOUS | Status: AC
  Administered 2023-10-16: 1000 mg via INTRAVENOUS
  Filled 2023-10-14 (×2): qty 100

## 2023-10-14 MED ORDER — OXYCODONE HCL 5 MG PO TABS
5.0000 mg | ORAL_TABLET | ORAL | Status: DC | PRN
  Administered 2023-10-14 – 2023-10-18 (×16): 5 mg via ORAL
  Filled 2023-10-14 (×17): qty 1

## 2023-10-14 MED ORDER — OXYCODONE HCL 5 MG PO TABS: 5.0000 mg | ORAL_TABLET | ORAL | Status: DC | PRN

## 2023-10-14 MED ORDER — SODIUM CHLORIDE 1 G PO TABS
1.0000 g | ORAL_TABLET | Freq: Two times a day (BID) | ORAL | Status: DC
  Administered 2023-10-14 (×2): 1 g via ORAL
  Filled 2023-10-14 (×2): qty 1

## 2023-10-14 MED ORDER — MORPHINE SULFATE (PF) 2 MG/ML IV SOLN
2.0000 mg | INTRAVENOUS | Status: AC | PRN
  Administered 2023-10-14: 2 mg via INTRAVENOUS
  Filled 2023-10-14: qty 1

## 2023-10-14 NOTE — Care Management Obs Status (Signed)
 MEDICARE OBSERVATION STATUS NOTIFICATION   Patient Details  Name: Calin Fantroy MRN: [id number] Date of Birth: 1946/01/05   Medicare Observation Status Notification Given:  Yes    Anise Kerns 10/14/2023, 10:18 AM

## 2023-10-14 NOTE — Progress Notes (Signed)
 History and Physical    Mike Townsend ZOX:[id number] DOB: 02/05/1946 DOA: 10/13/2023  PCP: [name], MD  Patient coming from: cardiology   Chief Complaint: syncope  HPI: Mike Townsend is a 78 y.o. male with medical history significant for sick sinus s/p pacemaker, a fib, cad, dchf, htn, ckd 3b, presenting with the above.  Chronic problem, happens from time to time, but happened twice in past two days, fell and hurt toes and knees and left hip today. Always happens when standing, today shortly after getting out of bed, yesterday while standing urinating. Gets lightheaded. No chest pain or palpitations. No recent illness, no fever, nausea, vomiting, or diarrhea. No head injury    Review of Systems: As per HPI otherwise 10 point review of systems negative.    Past Medical History:  Diagnosis Date   CHF (congestive heart failure) (HCC)    Dysrhythmia    A-Fib   Gout    Hyperlipidemia    Hypertension    Paroxysmal atrial fibrillation (HCC)    Presence of permanent cardiac pacemaker    Medtronic Micra AV  [id number]   Transfusion history    Wrist fracture 12/2019   left    Past Surgical History:  Procedure Laterality Date   CARDIAC CATHETERIZATION     CARDIAC ELECTROPHYSIOLOGY STUDY AND ABLATION     CATARACT EXTRACTION W/PHACO Right 12/23/2019   Procedure: CATARACT EXTRACTION PHACO AND INTRAOCULAR LENS PLACEMENT (IOC) RIGHT  PANOPTIC TORIC LENS;  Surgeon: [name], MD;  Location: [facility];  Service: Ophthalmology;  Laterality: Right;  4.70 0:34.7   CATARACT EXTRACTION W/PHACO Left 01/20/2020   Procedure: CATARACT EXTRACTION PHACO AND INTRAOCULAR LENS PLACEMENT (IOC) LEFT PANOPTIX LENS;  Surgeon: [name], MD;  Location: [facility];  Service: Ophthalmology;  Laterality: Left;  10.12 1:14   LAPAROSCOPIC GASTRIC BYPASS     LEFT HEART CATH AND CORONARY ANGIOGRAPHY N/A 10/24/2018   Procedure: LEFT HEART CATH AND CORONARY ANGIOGRAPHY;   Surgeon: [name], MD;  Location: [facility] INVASIVE CV LAB;  Service: Cardiovascular;  Laterality: N/A;   LUMBAR LAMINECTOMY     PACEMAKER INSERTION N/A 03/22/2016   Procedure: INSERTION PACEMAKER;  Surgeon: [name], MD;  Location: [facility] ORS;  Service: Cardiovascular;  Laterality: N/A;   TONSILLECTOMY       reports that he quit smoking about 4 years ago. His smoking use included pipe. He has never used smokeless tobacco. He reports current alcohol use. He reports that he does not use drugs.  No Known Allergies  Family History  Problem Relation Age of Onset   CAD Father     Prior to Admission medications   Medication Sig Start Date End Date Taking? Authorizing Provider  acetaminophen  (TYLENOL ) 325 MG tablet Take 2 tablets (650 mg total) by mouth every 6 (six) hours as needed for mild pain or moderate pain (or Fever >/= 101). 10/25/18   Gouru, Aruna, MD  allopurinol  (ZYLOPRIM ) 300 MG tablet Take 300 mg by mouth daily.    [provider]  amiodarone  (PACERONE ) 200 MG tablet Take 100 mg by mouth daily.     [provider]  ELIQUIS  5 MG TABS tablet Take 5 mg by mouth 2 (two) times a day. 10/15/18   [provider]  losartan  (COZAAR ) 25 MG tablet Take 25 mg by mouth daily.    [provider]  magnesium  oxide (MAG-OX) 400 MG tablet Take 400 mg by mouth daily.    [provider]  metoprolol  succinate (TOPROL -XL) 50 MG 24 hr tablet Take 1 tablet (50 mg total) by mouth daily. Take with or immediately following a meal. Patient taking differently: Take 25 mg by mouth daily. Take with or immediately following a meal. 10/26/18   Gouru, Aruna, MD  rosuvastatin  (CRESTOR ) 20 MG tablet Take 20 mg by mouth daily.    [provider]  Saw Palmetto , Serenoa repens, (SAW PALMETTO  PO) Take 1 capsule by mouth daily.    [provider]    Physical Exam: Vitals:   10/14/23 0200 10/14/23 0300 10/14/23 0338 10/14/23 0738  BP:   113/72  (!) 131/55  Pulse:   60 60  Resp: (!) 22 16 20 15   Temp:   98.8 F (37.1 C) 98.4 F (36.9 C)  TempSrc:   Oral Oral  SpO2:   100% 98%  Weight:      Height:        Constitutional: No acute distress Head: Atraumatic Eyes: Conjunctiva clear ENM: Moist mucous membranes. Normal dentition.  Neck: Supple Respiratory: Clear to auscultation bilaterally, no wheezing/rales/rhonchi. Normal respiratory effort. No accessory muscle use. . Cardiovascular: Regular rate and rhythm. No murmurs/rubs/gallops. Abdomen: Non-tender, non-distended. No masses. No rebound or guarding. Positive bowel sounds. Musculoskeletal: No joint deformity upper and lower extremities. Normal ROM, no contractures. Normal muscle tone.  Skin: No rashes, lesions, or ulcers.  Extremities: No peripheral edema. Palpable peripheral pulses. Neurologic: Alert, moving all 4 extremities. Psychiatric: Normal insight and judgement.   Labs on Admission: I have personally reviewed following labs and imaging studies  CBC: Recent Labs  Lab 10/13/23 0330 10/13/23 0855 10/14/23 0511  WBC 7.2 7.0 7.5  NEUTROABS 5.5 5.3  --   HGB 12.6* 10.9* 10.6*  HCT 36.7* 31.6* 31.0*  MCV 112.9* 110.9* 111.9*  PLT 131* 124* 119*   Basic Metabolic Panel: Recent Labs  Lab 10/13/23 0559 10/14/23 0511  NA 129* 129*  K 4.2 4.3  CL 99 99  CO2 23 24  GLUCOSE 117* 102*  BUN 29* 24*  CREATININE 1.56* 1.47*  CALCIUM  8.4* 8.4*  MG 2.0  --    GFR: Estimated Creatinine Clearance: 55.6 mL/min (A) (by C-G formula based on SCr of 1.47 mg/dL (H)). Liver Function Tests: Recent Labs  Lab 10/13/23 0559  AST 22  ALT 22  ALKPHOS 56  BILITOT 1.3*  PROT 6.1*  ALBUMIN  3.5   No results for input(s): "LIPASE", "AMYLASE" in the last 168 hours. No results for input(s): "AMMONIA" in the last 168 hours. Coagulation Profile: No results for input(s): "INR", "PROTIME" in the last 168 hours. Cardiac Enzymes: No results for input(s): "CKTOTAL", "CKMB",  "CKMBINDEX", "TROPONINI" in the last 168 hours. BNP (last 3 results) No results for input(s): "PROBNP" in the last 8760 hours. HbA1C: No results for input(s): "HGBA1C" in the last 72 hours. CBG: No results for input(s): "GLUCAP" in the last 168 hours. Lipid Profile: No results for input(s): "CHOL", "HDL", "LDLCALC", "TRIG", "CHOLHDL", "LDLDIRECT" in the last 72 hours. Thyroid Function Tests: Recent Labs    10/13/23 0833  TSH 3.479   Anemia Panel: Recent Labs    10/13/23 0833  VITAMINB12 320  FOLATE 11.1   Urine analysis:    Component Value Date/Time   COLORURINE YELLOW (A) 10/13/2023 0833   APPEARANCEUR CLEAR (A) 10/13/2023 0833   LABSPEC 1.018 10/13/2023 0833   PHURINE 6.0 10/13/2023 0833   GLUCOSEU NEGATIVE 10/13/2023 0833   HGBUR NEGATIVE 10/13/2023 0833   BILIRUBINUR NEGATIVE 10/13/2023 1610   Brena Calk  NEGATIVE 10/13/2023 0833   PROTEINUR NEGATIVE 10/13/2023 0833   NITRITE NEGATIVE 10/13/2023 0833   LEUKOCYTESUR NEGATIVE 10/13/2023 1610    Radiological Exams on Admission: DG Foot Complete Right Result Date: 10/13/2023 CLINICAL DATA:  Fall, right foot EXAM: RIGHT FOOT COMPLETE - 3+ VIEW COMPARISON:  None Available. FINDINGS: There is an acute fracture of the proximal diaphysis of the right fifth proximal phalanx with fracture fragments in near anatomic alignment. No other fracture. Dorsal lateral dislocation of the right second proximal phalanx. Remaining joint spaces are preserved. Small plantar calcaneal spur. Vascular calcifications. Mild soft tissue swelling of the dorsal right forefoot. IMPRESSION: 1. Acute fracture of the right fifth proximal phalanx. 2. Dorsal lateral dislocation of the right second proximal phalanx. Electronically Signed   By: Worthy Heads M.D.   On: 10/13/2023 04:44   DG Knee Complete 4 Views Right Result Date: 10/13/2023 CLINICAL DATA:  Fall, right knee pain EXAM: RIGHT KNEE - COMPLETE 4+ VIEW COMPARISON:  None Available. FINDINGS: Normal  alignment. No acute fracture or dislocation. Mild tricompartmental degenerative arthritis, most severe within the patellofemoral compartment. Small right knee effusion. Vascular calcifications are noted. IMPRESSION: 1. Mild tricompartmental degenerative arthritis. Small right knee effusion. Electronically Signed   By: Worthy Heads M.D.   On: 10/13/2023 04:42   DG Knee Complete 4 Views Left Result Date: 10/13/2023 CLINICAL DATA:  Fall EXAM: LEFT KNEE - COMPLETE 4+ VIEW COMPARISON:  None Available. FINDINGS: Normal alignment. No acute fracture or dislocation. Mild tricompartmental degenerative arthritis, most severe within the patellofemoral compartment. Small effusion. Vascular calcifications are noted. Varicoid densities within the subcutaneous soft tissues are in keeping with venous varicosities. IMPRESSION: 1. Mild tricompartmental degenerative arthritis. Small effusion. Electronically Signed   By: Worthy Heads M.D.   On: 10/13/2023 04:42   DG Hip Unilat W or Wo Pelvis 2-3 Views Left Result Date: 10/13/2023 CLINICAL DATA:  Fall EXAM: DG HIP (WITH OR WITHOUT PELVIS) 2-3V LEFT COMPARISON:  None Available. FINDINGS: Normal alignment. No acute fracture or dislocation. Mild left hip degenerative arthritis. Vascular calcifications are noted. IMPRESSION: 1. Mild left hip degenerative arthritis. Electronically Signed   By: Worthy Heads M.D.   On: 10/13/2023 04:25   CT Head Wo Contrast Result Date: 10/13/2023 EXAM: CT HEAD AND CERVICAL SPINE 10/13/2023 03:56:49 AM TECHNIQUE: CT of the head and cervical spine was performed without the administration of intravenous contrast. Multiplanar reformatted images are provided for review. Automated exposure control, iterative reconstruction, and/or weight based adjustment of the mA/kV was utilized to reduce the radiation dose to as low as reasonably achievable. COMPARISON: None available. CLINICAL HISTORY: Head trauma, minor (Age >= 65y). Patient C/O three falls in the  last twenty four hours. Patient states that twice the falls were syncopal episodes (he does have orthostatic hypotension), and one was a mechanical fall where he tripped. He does endorse hitting his head during one of the syncopal episodes, and is on eliquis . Patient is alert and oriented x4. He is now C/O left hip pain and bilateral knee pain from the most recent fall (around 0130 this morning). Also of note, patient has a pacemaker and he wonders if his episodes have anything to do with this. FINDINGS: HEAD: BRAIN AND VENTRICLES: There is no acute intracranial hemorrhage, mass effect or midline shift. No abnormal extra-axial fluid collection. The gray-white differentiation is maintained without an acute infarct. There is no hydrocephalus. Global cortical atrophy. Subcortical and periventricular small vessel ischemic changes. Intracranial atherosclerosis. ORBITS: The visualized portion of the  orbits demonstrate no acute abnormality. SINUSES: The visualized paranasal sinuses and mastoid air cells demonstrate no acute abnormality. SOFT TISSUES AND SKULL: No acute abnormality of the visualized skull or soft tissues. CERVICAL SPINE: BONES AND ALIGNMENT: There is no acute fracture or traumatic malalignment. DEGENERATIVE CHANGES: Mild degenerative changes at C5-6 and C6-7. SOFT TISSUES: There is no prevertebral soft tissue swelling. IMPRESSION: 1. No acute intracranial abnormality. 2. No acute fracture in the cervical spine. Electronically signed by: Zadie Herter MD 10/13/2023 04:01 AM EDT RP Workstation: [id number]   CT Cervical Spine Wo Contrast Result Date: 10/13/2023 EXAM: CT HEAD AND CERVICAL SPINE 10/13/2023 03:56:49 AM TECHNIQUE: CT of the head and cervical spine was performed without the administration of intravenous contrast. Multiplanar reformatted images are provided for review. Automated exposure control, iterative reconstruction, and/or weight based adjustment of the mA/kV was utilized to reduce the  radiation dose to as low as reasonably achievable. COMPARISON: None available. CLINICAL HISTORY: Head trauma, minor (Age >= 65y). Patient C/O three falls in the last twenty four hours. Patient states that twice the falls were syncopal episodes (he does have orthostatic hypotension), and one was a mechanical fall where he tripped. He does endorse hitting his head during one of the syncopal episodes, and is on eliquis . Patient is alert and oriented x4. He is now C/O left hip pain and bilateral knee pain from the most recent fall (around 0130 this morning). Also of note, patient has a pacemaker and he wonders if his episodes have anything to do with this. FINDINGS: HEAD: BRAIN AND VENTRICLES: There is no acute intracranial hemorrhage, mass effect or midline shift. No abnormal extra-axial fluid collection. The gray-white differentiation is maintained without an acute infarct. There is no hydrocephalus. Global cortical atrophy. Subcortical and periventricular small vessel ischemic changes. Intracranial atherosclerosis. ORBITS: The visualized portion of the orbits demonstrate no acute abnormality. SINUSES: The visualized paranasal sinuses and mastoid air cells demonstrate no acute abnormality. SOFT TISSUES AND SKULL: No acute abnormality of the visualized skull or soft tissues. CERVICAL SPINE: BONES AND ALIGNMENT: There is no acute fracture or traumatic malalignment. DEGENERATIVE CHANGES: Mild degenerative changes at C5-6 and C6-7. SOFT TISSUES: There is no prevertebral soft tissue swelling. IMPRESSION: 1. No acute intracranial abnormality. 2. No acute fracture in the cervical spine. Electronically signed by: Zadie Herter MD 10/13/2023 04:01 AM EDT RP Workstation: [id number]    EKG: Independently reviewed. Av paced rhythm  Assessment/Plan Principal Problem:   Syncope Active Problems:   Sick sinus syndrome (HCC)   Bradycardia   NSTEMI (non-ST elevated myocardial infarction) (HCC)   Coronary artery disease  due to lipid rich plaque   Dilated cardiomyopathy (HCC)   Essential hypertension   Paroxysmal atrial fibrillation (HCC)   Stage 3b chronic kidney disease (HCC)   SSS (sick sinus syndrome) (HCC)   Hypothyroidism   Closed dislocation of second toe of right foot   # Syncope, orthostatic Appears to be orthostatic. Is on metop and losartan . Longstanding issue, but more frequent as of late. TTE last month so holding on repeating. Unremarkable pacemaker interrogation. Appreciate cardiology recs, advise holding BB - maintain tele - holding BB and losartan  - salt tablets as below  # right 5th phalanx proximal fracture # Dorsal dislocation right second proximal phalanx.  Podiatry has consulted - continue buddy tape of 2nd toe, wvat, f/u 2 weeks dr standiford  # Left hip pain Ongoing, severe, unable to bear weight. No fracture on plain films 2 days ago - check CT of pelvis  #  Debility PT advising snf, patient agreeable - TOC consulted  # HTN Bp appropriate - home meds held  # A-fib Av paced rhythm, s/p multiple recent cardioversions - continue home amio, holding home BB - hold home apixaban  pending CT evaluation of hip  # Sick sinus S/p pacer. Cardiology says pacemaker interrogated, no issues.  # Hypothyroid Tsh wnl - home synthroid   # HFpEF Euvolemic  # Hyponatremia Appears euvolemic. Labs consistent w/ siadh - f/u am cortisol - start salt tabs  # CKD 3a At baseline - monitor  # Macrocytosis b12, folate wnl, smear unremarkable  # Thrombocytopenia - hiv, hcv - smear unremarkable  DVT prophylaxis: SCDs Code Status: full Family Communication: wife at bedside  Consults called: podiatry, cardiology   Level of care: Telemetry Cardiac Status is: Observation     Raymonde Calico MD [facility] Pager [phone]  If 7PM-7AM, please contact night-coverage [email or web address] Password [facility]  10/14/2023, 12:23 PM  ]

## 2023-10-14 NOTE — Progress Notes (Addendum)
 Physical Therapy Treatment Patient Details Name: Mike Townsend MRN: [id number] DOB: 04/25/1946 Today's Date: 10/14/2023   History of Present Illness Pt is a 78 year old presenting with syncope, imaging also found R 5th phalanx proximal fracture, Dorsal dislocation right second proximal phalanx    PMH significant for sick sinus s/p pacemaker, a fib, cad, dchf, htn, ckd 3b    PT Comments  Pt received in Semi-Fowler's position and agreeable to therapy.  Pt wanting to mobilize today, as he feels as though he is very weak.  Pt able to move to the EOB with UE support on the bed rails.  Pt noted to be very dizzy upon sitting and noting that the room was moving up/down and he was feeling lightheaded.  Pt given direction to focus on one spot (sink faucet) which assisted with the movement but took a longer period of time to get "balanced".  Pt still reported significant lightheadedness along with 10/10 pain in the L hip that was radiating down the L LE.  Pt ultimately attempted to stand, but was too weak to do so.  Pt not wanting to go to SNF at this time, however due to the transfer complications, SNF still seems to be the best recommendation at this time.  MD notified of the pain in the hip for further imaging if necessary.  Pt and spouse left with all needs met, questions answered, and call bell within reach.     If plan is discharge home, recommend the following: A little help with walking and/or transfers;A little help with bathing/dressing/bathroom   Can travel by private vehicle        Equipment Recommendations  Rolling walker (2 wheels)    Recommendations for Other Services       Precautions / Restrictions Precautions Precautions: Fall Recall of Precautions/Restrictions: Intact Restrictions Weight Bearing Restrictions Per Provider Order: Yes RLE Weight Bearing Per Provider Order: Weight bearing as tolerated (in post op shoe)     Mobility  Bed Mobility Overal bed mobility: Needs  Assistance Bed Mobility: Supine to Sit     Supine to sit: Contact guard, HOB elevated, Used rails          Transfers                   General transfer comment: lateral scoot up the bed to the R with supervision    Ambulation/Gait         Gait velocity: decreased     General Gait Details: deferred due to significant dizziness and pain in the L hip.   Stairs             Wheelchair Mobility     Tilt Bed    Modified Rankin (Stroke Patients Only)       Balance Overall balance assessment: Needs assistance Sitting-balance support: Feet supported Sitting balance-Leahy Scale: Good                                      Communication Communication Communication: No apparent difficulties  Cognition Arousal: Alert Behavior During Therapy: WFL for tasks assessed/performed   PT - Cognitive impairments: No apparent impairments                         Following commands: Intact      Cueing    Exercises      General Comments General  comments (skin integrity, edema, etc.): +orthostatics, deferred further mobility      Pertinent Vitals/Pain Pain Assessment Pain Assessment: 0-10 Faces Pain Scale: Hurts worst Pain Location: L Hip Pain Descriptors / Indicators: Constant, Discomfort Pain Intervention(s): Limited activity within patient's tolerance, Monitored during session, Premedicated before session, Repositioned    Home Living                          Prior Function            PT Goals (current goals can now be found in the care plan section) Acute Rehab PT Goals Patient Stated Goal: to go home PT Goal Formulation: With patient Time For Goal Achievement: 10/27/23 Potential to Achieve Goals: Good Progress towards PT goals: Not progressing toward goals - comment (Pt still with significant orthostatics and pain in the L hip.)    Frequency    Min 2X/week      PT Plan      Co-evaluation               AM-PAC PT "6 Clicks" Mobility   Outcome Measure  Help needed turning from your back to your side while in a flat bed without using bedrails?: A Little Help needed moving from lying on your back to sitting on the side of a flat bed without using bedrails?: A Little Help needed moving to and from a bed to a chair (including a wheelchair)?: A Little Help needed standing up from a chair using your arms (e.g., wheelchair or bedside chair)?: A Little Help needed to walk in hospital room?: A Lot Help needed climbing 3-5 steps with a railing? : A Lot 6 Click Score: 16    End of Session Equipment Utilized During Treatment: Gait belt Activity Tolerance: Patient tolerated treatment well Patient left: in bed;with call bell/phone within reach Nurse Communication: Mobility status PT Visit Diagnosis: Unsteadiness on feet (R26.81);Muscle weakness (generalized) (M62.81)     Time: 1610-9604 PT Time Calculation (min) (ACUTE ONLY): 37 min  Charges:    $Therapeutic Exercise: 23-37 mins PT General Charges $$ ACUTE PT VISIT: 1 Visit                     Rozanna Corner, PT, DPT Physical Therapist - [facility]  10/14/23, 3:29 PM

## 2023-10-14 NOTE — [place] FL2 (Signed)
 [place]  MEDICAID FL2 LEVEL OF CARE FORM     IDENTIFICATION  Patient Name: Mike Townsend Birthdate: [birth year] Sex: male Admission Date (Current Location): 10/13/2023  [facility] and [place] Number:  Chiropodist and Address:  [facility], [address] [place], [place] [address]      Provider Number: [id number]  Attending Physician Name and Address:  Janeane Mealy, MD  Relative Name and Phone Number:  Spouse: [name], [phone]    Current Level of Care: Hospital Recommended Level of Care: Skilled Nursing Facility Prior Approval Number:    Date Approved/Denied:   PASRR Number: [id number]  Discharge Plan: SNF    Current Diagnoses: Patient Active Problem List   Diagnosis Date Noted   Coronary artery disease due to lipid rich plaque 10/13/2023   Hypothyroidism 10/13/2023   Syncope 10/13/2023   Closed dislocation of second toe of right foot 10/13/2023   Dilated cardiomyopathy (HCC) 05/17/2019   Stage 3b chronic kidney disease (HCC) 05/17/2019   SSS (sick sinus syndrome) (HCC) 05/05/2019   NSTEMI (non-ST elevated myocardial infarction) (HCC) 10/21/2018   Sick sinus syndrome (HCC) 03/22/2016   Bradycardia 03/22/2016   Essential hypertension 07/03/2014   Paroxysmal atrial fibrillation (HCC) 07/03/2014    Orientation RESPIRATION BLADDER Height & Weight     Self, Time, Situation, Place  Normal Continent Weight: 106.6 kg Height:  6\' 2"  (188 cm)  BEHAVIORAL SYMPTOMS/MOOD NEUROLOGICAL BOWEL NUTRITION STATUS      Continent Diet (Regular diet, thin liquid)  AMBULATORY STATUS COMMUNICATION OF NEEDS Skin   Extensive Assist Verbally Normal                       Personal Care Assistance Level of Assistance  Bathing, Feeding, Dressing Bathing Assistance: Limited assistance Feeding assistance: Independent Dressing Assistance: Limited assistance     Functional Limitations Info             SPECIAL CARE FACTORS  FREQUENCY  PT (By licensed PT), OT (By licensed OT)     PT Frequency: 5 times per week OT Frequency: 5 times per week            Contractures Contractures Info: Not present    Additional Factors Info  Code Status, Allergies Code Status Info: Full Code Allergies Info: NKDA           Current Medications (10/14/2023):  This is the current hospital active medication list Current Facility-Administered Medications  Medication Dose Route Frequency Provider Last Rate Last Admin   allopurinol  (ZYLOPRIM ) tablet 300 mg  300 mg Oral Daily Janeane Mealy, MD   300 mg at 10/14/23 0825   amiodarone  (PACERONE ) tablet 100 mg  100 mg Oral Daily Janeane Mealy, MD   100 mg at 10/14/23 0825   levothyroxine  (SYNTHROID ) tablet 75 mcg  75 mcg Oral Q0600 Janeane Mealy, MD   75 mcg at 10/14/23 0550   oxyCODONE  (Oxy IR/ROXICODONE ) immediate release tablet 5 mg  5 mg Oral Q4H PRN Duncan, Hazel V, MD   5 mg at 10/14/23 8469   rosuvastatin  (CRESTOR ) tablet 20 mg  20 mg Oral Daily Janeane Mealy, MD   20 mg at 10/14/23 0825   sodium chloride  tablet 1 g  1 g Oral BID WC Wouk, Haynes Lips, MD   1 g at 10/14/23 6295     Discharge Medications: Please see discharge summary for a list of discharge medications.  Relevant Imaging Results:  Relevant Lab Results:  Additional Information SSN: [id number]  Alexandra Ice, RN

## 2023-10-14 NOTE — Plan of Care (Signed)
  Problem: Education: Goal: Knowledge of General Education information will improve Description: Including pain rating scale, medication(s)/side effects and non-pharmacologic comfort measures Outcome: Progressing   Problem: Clinical Measurements: Goal: Ability to maintain clinical measurements within normal limits will improve Outcome: Progressing   Problem: Clinical Measurements: Goal: Will remain free from infection Outcome: Progressing   Problem: Clinical Measurements: Goal: Diagnostic test results will improve Outcome: Progressing   Problem: Clinical Measurements: Goal: Respiratory complications will improve Outcome: Progressing   Problem: Clinical Measurements: Goal: Cardiovascular complication will be avoided Outcome: Progressing   Problem: Activity: Goal: Risk for activity intolerance will decrease Outcome: Progressing   Problem: Pain Managment: Goal: General experience of comfort will improve and/or be controlled Outcome: Progressing   Problem: Safety: Goal: Ability to remain free from injury will improve Outcome: Progressing   Problem: Skin Integrity: Goal: Risk for impaired skin integrity will decrease Outcome: Progressing    Plan of care ongoing, see MAR see flowsheet

## 2023-10-14 NOTE — Plan of Care (Signed)
  Problem: Clinical Measurements: Goal: Will remain free from infection Outcome: Progressing   Problem: Activity: Goal: Risk for activity intolerance will decrease Outcome: Progressing   Problem: Pain Managment: Goal: General experience of comfort will improve and/or be controlled Outcome: Progressing

## 2023-10-15 ENCOUNTER — Observation Stay

## 2023-10-15 DIAGNOSIS — R55 Syncope and collapse: Secondary | ICD-10-CM | POA: Diagnosis not present

## 2023-10-15 LAB — CBC
HCT: 27.5 % — ABNORMAL LOW (ref 39.0–52.0)
Hemoglobin: 9.5 g/dL — ABNORMAL LOW (ref 13.0–17.0)
MCH: 38 pg — ABNORMAL HIGH (ref 26.0–34.0)
MCHC: 34.5 g/dL (ref 30.0–36.0)
MCV: 110 fL — ABNORMAL HIGH (ref 80.0–100.0)
Platelets: 115 10*3/uL — ABNORMAL LOW (ref 150–400)
RBC: 2.5 MIL/uL — ABNORMAL LOW (ref 4.22–5.81)
RDW: 14.5 % (ref 11.5–15.5)
WBC: 8 10*3/uL (ref 4.0–10.5)
nRBC: 0 % (ref 0.0–0.2)

## 2023-10-15 LAB — BASIC METABOLIC PANEL WITH GFR
Anion gap: 6 (ref 5–15)
BUN: 21 mg/dL (ref 8–23)
CO2: 22 mmol/L (ref 22–32)
Calcium: 8.1 mg/dL — ABNORMAL LOW (ref 8.9–10.3)
Chloride: 97 mmol/L — ABNORMAL LOW (ref 98–111)
Creatinine, Ser: 1.42 mg/dL — ABNORMAL HIGH (ref 0.61–1.24)
GFR, Estimated: 51 mL/min — ABNORMAL LOW (ref >60)
Glucose, Bld: 106 mg/dL — ABNORMAL HIGH (ref 70–99)
Potassium: 4.3 mmol/L (ref 3.5–5.1)
Sodium: 125 mmol/L — ABNORMAL LOW (ref 135–145)

## 2023-10-15 LAB — BRAIN NATRIURETIC PEPTIDE: B Natriuretic Peptide: 88.2 pg/mL (ref 0.0–100.0)

## 2023-10-15 MED ORDER — SODIUM CHLORIDE 1 G PO TABS
1.0000 g | ORAL_TABLET | Freq: Three times a day (TID) | ORAL | Status: DC
  Administered 2023-10-15 – 2023-10-19 (×13): 1 g via ORAL
  Filled 2023-10-15 (×13): qty 1

## 2023-10-15 MED ORDER — POLYETHYLENE GLYCOL 3350 17 G PO PACK
34.0000 g | PACK | Freq: Every day | ORAL | Status: DC
  Administered 2023-10-15 – 2023-10-16 (×2): 34 g via ORAL
  Filled 2023-10-15 (×3): qty 2

## 2023-10-15 MED ORDER — SENNA 8.6 MG PO TABS
1.0000 | ORAL_TABLET | Freq: Every day | ORAL | Status: DC
  Administered 2023-10-15 – 2023-10-17 (×3): 8.6 mg via ORAL
  Filled 2023-10-15 (×3): qty 1

## 2023-10-15 NOTE — TOC Progression Note (Signed)
 Transition of Care [facility]) - Progression Note    Patient Details  Name: Mike Townsend MRN: [id number] Date of Birth: April 15, 1946  Transition of [facility]) CM/SW Contact  Alexandra Ice, RN Phone Number: 10/15/2023, 1:14 PM  Clinical Narrative:        [facility] completed, pending bed offers.     Expected Discharge Plan and Services                                               Social Determinants of Health (SDOH) Interventions SDOH Screenings   Food Insecurity: No Food Insecurity (10/13/2023)  Housing: Unknown (10/13/2023)  Transportation Needs: No Transportation Needs (10/13/2023)  Utilities: Not At Risk (10/13/2023)  Financial Resource Strain: Low Risk  (04/25/2023)   Received from [facility]  Social Connections: Moderately Integrated (10/13/2023)  Tobacco Use: Medium Risk (07/07/2023)   Received from [facility]    Readmission Risk Interventions     No data to display

## 2023-10-15 NOTE — Plan of Care (Signed)
  Problem: Education: Goal: Knowledge of General Education information will improve Description: Including pain rating scale, medication(s)/side effects and non-pharmacologic comfort measures Outcome: Progressing   Problem: Health Behavior/Discharge Planning: Goal: Ability to manage health-related needs will improve Outcome: Progressing   Problem: Clinical Measurements: Goal: Will remain free from infection Outcome: Progressing   Problem: Clinical Measurements: Goal: Diagnostic test results will improve Outcome: Progressing   Problem: Clinical Measurements: Goal: Respiratory complications will improve Outcome: Progressing   Problem: Activity: Goal: Risk for activity intolerance will decrease Outcome: Progressing   Problem: Coping: Goal: Level of anxiety will decrease Outcome: Progressing   Problem: Pain Managment: Goal: General experience of comfort will improve and/or be controlled Outcome: Progressing   Problem: Safety: Goal: Ability to remain free from injury will improve Outcome: Progressing   Problem: Skin Integrity: Goal: Risk for impaired skin integrity will decrease Outcome: Progressing   Plan of care ongoing, see MAR see flowsheet

## 2023-10-15 NOTE — Progress Notes (Signed)
 PROGRESS NOTE    Mike Townsend  WUJ:[id number] DOB: 12-03-1945 DOA: 10/13/2023 PCP: [name], MD     Brief Narrative:   Mike Townsend is a 78 y.o. male with medical history significant for sick sinus s/p pacemaker, a fib, cad, dchf, htn, ckd 3b, presenting with the above.   Chronic problem, happens from time to time, but happened twice in past two days, fell and hurt toes and knees and left hip today. Always happens when standing, today shortly after getting out of bed, yesterday while standing urinating. Gets lightheaded. No chest pain or palpitations. No recent illness, no fever, nausea, vomiting, or diarrhea. No head injury     Assessment & Plan:   Principal Problem:   Syncope Active Problems:   Sick sinus syndrome (HCC)   Bradycardia   NSTEMI (non-ST elevated myocardial infarction) (HCC)   Coronary artery disease due to lipid rich plaque   Dilated cardiomyopathy (HCC)   Essential hypertension   Paroxysmal atrial fibrillation (HCC)   Stage 3b chronic kidney disease (HCC)   SSS (sick sinus syndrome) (HCC)   Hypothyroidism   Closed dislocation of second toe of right foot   # Syncope, orthostatic Appears to be orthostatic. Is on metop and losartan . Longstanding issue, but more frequent as of late. TTE last month so holding on repeating. Unremarkable pacemaker interrogation. Appreciate cardiology recs, advise holding BB - maintain tele - holding BB and losartan  - salt tablets as below   # right 5th phalanx proximal fracture # Dorsal dislocation right second proximal phalanx.  Podiatry has consulted - continue buddy tape of 2nd toe, wvat, f/u 2 weeks dr standiford   # Left gluteal hematoma Ct obtained for ongoing pain, intramuscular hematoma identified. Appears stable - apixaban  on hold  # Blood loss anemia 2/2 above hematoma, hgb has trended to 9.5 now stable there - holding apixaban  - monitor  # Hyponatremia Appears euvolemic. Labs consistent w/ siadh.  Salt tabs started yesterday, sodium now down to 125. Cortisol and tsh wnl - increase salt tabs to tid - ct chest   # Debility PT advising snf, patient agreeable - TOC consulted   # HTN Bp appropriate - home meds held   # A-fib Av paced rhythm, s/p multiple recent cardioversions. Chads2vasc of 3 - continue home amio, holding home BB - hold home apixaban  given frequent falls, hematoma. Will plan on holding at discharge, can consider resuming as outpatient if orthostasis and frequent falls better controlled   # Sick sinus S/p pacer. Cardiology says pacemaker interrogated, no issues.   # Hypothyroid Tsh wnl - home synthroid    # HFpEF Euvolemic  # CKD 3a At baseline - monitor   # Macrocytosis b12, folate wnl, smear unremarkable   # Thrombocytopenia Hiv neg, smear unremarkable. Stable, mild - f/u hcv  # Constipation - start miralax/senna   DVT prophylaxis: SCDs Code Status: full Family Communication: wife at bedside  Consults called: podiatry, cardiology   Level of care: Telemetry Cardiac Status is: Observation     Consultants:  cardiology  Procedures: none  Antimicrobials:  none    Subjective: Reports ongoing knee and hip pain some improvement  Objective: Vitals:   10/14/23 1955 10/15/23 0005 10/15/23 0357 10/15/23 0751  BP: (!) 97/47 124/61 (!) 122/58 114/62  Pulse: 68 61 61 60  Resp: 20 20 20 11   Temp: 98.6 F (37 C) 98.7 F (37.1 C) 99.2 F (37.3 C) 98.4 F (36.9 C)  TempSrc:    Oral  SpO2:  96% 94% 98% 97%  Weight:      Height:        Intake/Output Summary (Last 24 hours) at 10/15/2023 1147 Last data filed at 10/15/2023 0753 Gross per 24 hour  Intake --  Output 800 ml  Net -800 ml   Filed Weights   10/13/23 0325  Weight: 106.6 kg    Examination:  General exam: Appears calm and comfortable  Respiratory system: Clear to auscultation. Respiratory effort normal. Cardiovascular system: S1 & S2 heard, RRR.   Gastrointestinal  system: Abdomen is nondistended, soft and nontender.   Central nervous system: Alert and oriented. No focal neurological deficits. Extremities: Symmetric 5 x 5 power.  Skin: bruising left buttock Psychiatry: Judgement and insight appear normal. Mood & affect appropriate.     Data Reviewed: I have personally reviewed following labs and imaging studies  CBC: Recent Labs  Lab 10/13/23 0330 10/13/23 0855 10/14/23 0511 10/14/23 1851 10/15/23 0426  WBC 7.2 7.0 7.5  --  8.0  NEUTROABS 5.5 5.3  --   --   --   HGB 12.6* 10.9* 10.6* 9.5* 9.5*  HCT 36.7* 31.6* 31.0*  --  27.5*  MCV 112.9* 110.9* 111.9*  --  110.0*  PLT 131* 124* 119*  --  115*   Basic Metabolic Panel: Recent Labs  Lab 10/13/23 0559 10/14/23 0511 10/15/23 0426  NA 129* 129* 125*  K 4.2 4.3 4.3  CL 99 99 97*  CO2 23 24 22   GLUCOSE 117* 102* 106*  BUN 29* 24* 21  CREATININE 1.56* 1.47* 1.42*  CALCIUM  8.4* 8.4* 8.1*  MG 2.0  --   --    GFR: Estimated Creatinine Clearance: 57.6 mL/min (A) (by C-G formula based on SCr of 1.42 mg/dL (H)). Liver Function Tests: Recent Labs  Lab 10/13/23 0559  AST 22  ALT 22  ALKPHOS 56  BILITOT 1.3*  PROT 6.1*  ALBUMIN  3.5   No results for input(s): "LIPASE", "AMYLASE" in the last 168 hours. No results for input(s): "AMMONIA" in the last 168 hours. Coagulation Profile: No results for input(s): "INR", "PROTIME" in the last 168 hours. Cardiac Enzymes: No results for input(s): "CKTOTAL", "CKMB", "CKMBINDEX", "TROPONINI" in the last 168 hours. BNP (last 3 results) No results for input(s): "PROBNP" in the last 8760 hours. HbA1C: No results for input(s): "HGBA1C" in the last 72 hours. CBG: No results for input(s): "GLUCAP" in the last 168 hours. Lipid Profile: No results for input(s): "CHOL", "HDL", "LDLCALC", "TRIG", "CHOLHDL", "LDLDIRECT" in the last 72 hours. Thyroid Function Tests: Recent Labs    10/13/23 0833  TSH 3.479   Anemia Panel: Recent Labs     10/13/23 0833  VITAMINB12 320  FOLATE 11.1   Urine analysis:    Component Value Date/Time   COLORURINE YELLOW (A) 10/13/2023 0833   APPEARANCEUR CLEAR (A) 10/13/2023 0833   LABSPEC 1.018 10/13/2023 0833   PHURINE 6.0 10/13/2023 0833   GLUCOSEU NEGATIVE 10/13/2023 0833   HGBUR NEGATIVE 10/13/2023 0833   BILIRUBINUR NEGATIVE 10/13/2023 0833   KETONESUR NEGATIVE 10/13/2023 0833   PROTEINUR NEGATIVE 10/13/2023 0833   NITRITE NEGATIVE 10/13/2023 0833   LEUKOCYTESUR NEGATIVE 10/13/2023 0833   Sepsis Labs: @LABRCNTIP (procalcitonin:4,lacticidven:4)  )No results found for this or any previous visit (from the past 240 hours).       Radiology Studies: CT PELVIS WO CONTRAST Result Date: 10/14/2023 CLINICAL DATA:  Fall and left hip pain. EXAM: CT PELVIS WITHOUT CONTRAST TECHNIQUE: Multidetector CT imaging of the pelvis was performed following the  standard protocol without intravenous contrast. RADIATION DOSE REDUCTION: This exam was performed according to the departmental dose-optimization program which includes automated exposure control, adjustment of the mA and/or kV according to patient size and/or use of iterative reconstruction technique. COMPARISON:  Left hip radiograph dated 10/13/2023. FINDINGS: Urinary Tract:  No abnormality visualized. Bowel:  Unremarkable visualized pelvic bowel loops. Vascular/Lymphatic: Advanced aortoiliac atherosclerotic disease. The IVC is unremarkable no pelvic adenopathy. Reproductive: The prostate and seminal vesicles are grossly remarkable. Other:  None Musculoskeletal: No acute fracture or dislocation. The bones are osteopenic. Mild bilateral hip arthritic changes. Left gluteal intramuscular hematoma. There is subcutaneous contusion of the left buttock. No drainable fluid collection. IMPRESSION: 1. No acute fracture or dislocation. 2. Left buttock subcutaneous contusion and left gluteal intramuscular hematoma. 3.  Aortic Atherosclerosis (ICD10-I70.0).  Electronically Signed   By: Angus Bark M.D.   On: 10/14/2023 13:20        Scheduled Meds:  allopurinol   300 mg Oral Daily   amiodarone   100 mg Oral Daily   levothyroxine   75 mcg Oral Q0600   polyethylene glycol  34 g Oral Daily   rosuvastatin   20 mg Oral Daily   senna  1 tablet Oral Daily   sodium chloride   1 g Oral TID WC   Continuous Infusions:  tranexamic acid        LOS: 0 days     Raymonde Calico, MD [facility]   If 7PM-7AM, please contact night-coverage [email or web address] Password [email or web address] 10/15/2023, 11:47 AM

## 2023-10-16 DIAGNOSIS — R55 Syncope and collapse: Secondary | ICD-10-CM | POA: Diagnosis not present

## 2023-10-16 LAB — BASIC METABOLIC PANEL WITH GFR
Anion gap: 8 (ref 5–15)
BUN: 21 mg/dL (ref 8–23)
CO2: 21 mmol/L — ABNORMAL LOW (ref 22–32)
Calcium: 8.2 mg/dL — ABNORMAL LOW (ref 8.9–10.3)
Chloride: 99 mmol/L (ref 98–111)
Creatinine, Ser: 1.37 mg/dL — ABNORMAL HIGH (ref 0.61–1.24)
GFR, Estimated: 53 mL/min — ABNORMAL LOW (ref >60)
Glucose, Bld: 104 mg/dL — ABNORMAL HIGH (ref 70–99)
Potassium: 4.3 mmol/L (ref 3.5–5.1)
Sodium: 128 mmol/L — ABNORMAL LOW (ref 135–145)

## 2023-10-16 LAB — CBC
HCT: 25.8 % — ABNORMAL LOW (ref 39.0–52.0)
Hemoglobin: 9 g/dL — ABNORMAL LOW (ref 13.0–17.0)
MCH: 38.3 pg — ABNORMAL HIGH (ref 26.0–34.0)
MCHC: 34.9 g/dL (ref 30.0–36.0)
MCV: 109.8 fL — ABNORMAL HIGH (ref 80.0–100.0)
Platelets: 124 10*3/uL — ABNORMAL LOW (ref 150–400)
RBC: 2.35 MIL/uL — ABNORMAL LOW (ref 4.22–5.81)
RDW: 14.6 % (ref 11.5–15.5)
WBC: 7.5 10*3/uL (ref 4.0–10.5)
nRBC: 0 % (ref 0.0–0.2)

## 2023-10-16 NOTE — Progress Notes (Signed)
 PROGRESS NOTE    Antionne Enrique  ZOX:[id number] DOB: April 21, 1946 DOA: 10/13/2023 PCP: [name], MD     Brief Narrative:   Ayan Heffington is a 78 y.o. male with medical history significant for sick sinus s/p pacemaker, a fib, cad, dchf, htn, ckd 3b, presenting with the above.   Chronic problem, happens from time to time, but happened twice in past two days, fell and hurt toes and knees and left hip today. Always happens when standing, today shortly after getting out of bed, yesterday while standing urinating. Gets lightheaded. No chest pain or palpitations. No recent illness, no fever, nausea, vomiting, or diarrhea. No head injury     Assessment & Plan:   Principal Problem:   Syncope Active Problems:   Sick sinus syndrome (HCC)   Bradycardia   NSTEMI (non-ST elevated myocardial infarction) (HCC)   Coronary artery disease due to lipid rich plaque   Dilated cardiomyopathy (HCC)   Essential hypertension   Paroxysmal atrial fibrillation (HCC)   Stage 3b chronic kidney disease (HCC)   SSS (sick sinus syndrome) (HCC)   Hypothyroidism   Closed dislocation of second toe of right foot   # Syncope, orthostatic Appears to be orthostatic. Is on metop and losartan . Longstanding issue, but more frequent as of late. TTE last month so holding on repeating. Unremarkable pacemaker interrogation. Appreciate cardiology recs, advise holding BB. We are also trying TED hose and abdominal binder - maintain tele - holding BB and losartan  - salt tablets as below   # right 5th phalanx proximal fracture # Dorsal dislocation right second proximal phalanx.  Podiatry has consulted - continue buddy tape of 2nd toe, wvat, f/u 2 weeks dr standiford   # Left gluteal hematoma Ct obtained for ongoing pain, intramuscular hematoma identified. Appears stable. Gave TXA - apixaban  on hold  # Blood loss anemia 2/2 above hematoma, hgb has trended to 9.5 last 2 days, 9 today - holding apixaban  - monitor for  now  # Hyponatremia Appears euvolemic. Labs consistent w/ siadh. Salt tabs started 6/7, sodium has improved 125>128. Cortisol and tsh wnl. CT chest nothing acute - continue salt tabs   # Debility PT advising snf, patient agreeable - TOC consulted, bed search underway   # HTN Bp appropriate - home meds held   # A-fib Av paced rhythm, s/p multiple recent cardioversions. Chads2vasc of 3 - continue home amio, holding home BB - hold home apixaban  given frequent falls, hematoma. Will plan on holding at discharge, can consider resuming as outpatient if orthostasis and frequent falls better controlled   # Sick sinus S/p pacer. Cardiology says pacemaker interrogated, no issues.   # Hypothyroid Tsh wnl - home synthroid    # HFpEF Euvolemic  # CKD 3a At baseline - monitor   # Macrocytosis b12, folate wnl, smear unremarkable   # Thrombocytopenia Hiv neg, smear unremarkable. Stable, mild - f/u hcv  # Constipation - continue miralax/senna   DVT prophylaxis: SCDs Code Status: full Family Communication: wife at bedside 6/9 Consults called: podiatry, cardiology   Level of care: Telemetry Cardiac Status is: Observation     Consultants:  cardiology  Procedures: none  Antimicrobials:  none    Subjective: Reports ongoing knee and hip pain some improvement, able to ambulate some with PT today  Objective: Vitals:   10/16/23 0045 10/16/23 0521 10/16/23 0948 10/16/23 1149  BP: 121/67 117/62 (!) 111/53 121/64  Pulse: 61 61 60 60  Resp: 20 18    Temp: 100 F (  37.8 C) 98 F (36.7 C) 98.9 F (37.2 C) 98.8 F (37.1 C)  TempSrc: Oral Oral Oral Oral  SpO2: 96% 95% 99% 98%  Weight:      Height:        Intake/Output Summary (Last 24 hours) at 10/16/2023 1520 Last data filed at 10/16/2023 1155 Gross per 24 hour  Intake 480 ml  Output 1500 ml  Net -1020 ml   Filed Weights   10/13/23 0325  Weight: 106.6 kg    Examination:  General exam: Appears calm and  comfortable  Respiratory system: Clear to auscultation. Respiratory effort normal. Cardiovascular system: S1 & S2 heard, RRR.   Gastrointestinal system: Abdomen is nondistended, soft and nontender.   Central nervous system: Alert and oriented. No focal neurological deficits. Extremities: Symmetric 5 x 5 power.  Skin: bruising left buttock Psychiatry: Judgement and insight appear normal. Mood & affect appropriate.     Data Reviewed: I have personally reviewed following labs and imaging studies  CBC: Recent Labs  Lab 10/13/23 0330 10/13/23 0855 10/14/23 0511 10/14/23 1851 10/15/23 0426 10/16/23 0418  WBC 7.2 7.0 7.5  --  8.0 7.5  NEUTROABS 5.5 5.3  --   --   --   --   HGB 12.6* 10.9* 10.6* 9.5* 9.5* 9.0*  HCT 36.7* 31.6* 31.0*  --  27.5* 25.8*  MCV 112.9* 110.9* 111.9*  --  110.0* 109.8*  PLT 131* 124* 119*  --  115* 124*   Basic Metabolic Panel: Recent Labs  Lab 10/13/23 0559 10/14/23 0511 10/15/23 0426 10/16/23 0418  NA 129* 129* 125* 128*  K 4.2 4.3 4.3 4.3  CL 99 99 97* 99  CO2 23 24 22  21*  GLUCOSE 117* 102* 106* 104*  BUN 29* 24* 21 21  CREATININE 1.56* 1.47* 1.42* 1.37*  CALCIUM  8.4* 8.4* 8.1* 8.2*  MG 2.0  --   --   --    GFR: Estimated Creatinine Clearance: 59.7 mL/min (A) (by C-G formula based on SCr of 1.37 mg/dL (H)). Liver Function Tests: Recent Labs  Lab 10/13/23 0559  AST 22  ALT 22  ALKPHOS 56  BILITOT 1.3*  PROT 6.1*  ALBUMIN  3.5   No results for input(s): "LIPASE", "AMYLASE" in the last 168 hours. No results for input(s): "AMMONIA" in the last 168 hours. Coagulation Profile: No results for input(s): "INR", "PROTIME" in the last 168 hours. Cardiac Enzymes: No results for input(s): "CKTOTAL", "CKMB", "CKMBINDEX", "TROPONINI" in the last 168 hours. BNP (last 3 results) No results for input(s): "PROBNP" in the last 8760 hours. HbA1C: No results for input(s): "HGBA1C" in the last 72 hours. CBG: No results for input(s): "GLUCAP" in the  last 168 hours. Lipid Profile: No results for input(s): "CHOL", "HDL", "LDLCALC", "TRIG", "CHOLHDL", "LDLDIRECT" in the last 72 hours. Thyroid Function Tests: No results for input(s): "TSH", "T4TOTAL", "FREET4", "T3FREE", "THYROIDAB" in the last 72 hours.  Anemia Panel: No results for input(s): "VITAMINB12", "FOLATE", "FERRITIN", "TIBC", "IRON", "RETICCTPCT" in the last 72 hours.  Urine analysis:    Component Value Date/Time   COLORURINE YELLOW (A) 10/13/2023 0833   APPEARANCEUR CLEAR (A) 10/13/2023 0833   LABSPEC 1.018 10/13/2023 0833   PHURINE 6.0 10/13/2023 0833   GLUCOSEU NEGATIVE 10/13/2023 0833   HGBUR NEGATIVE 10/13/2023 0833   BILIRUBINUR NEGATIVE 10/13/2023 0833   KETONESUR NEGATIVE 10/13/2023 0833   PROTEINUR NEGATIVE 10/13/2023 0833   NITRITE NEGATIVE 10/13/2023 0833   LEUKOCYTESUR NEGATIVE 10/13/2023 0833   Sepsis Labs: @LABRCNTIP (procalcitonin:4,lacticidven:4)  )No results found  for this or any previous visit (from the past 240 hours).       Radiology Studies: CT CHEST WO CONTRAST Result Date: 10/15/2023 CLINICAL DATA:  Hyponatremia, SIADH, rule out malignancy * Tracking Code: BO * EXAM: CT CHEST WITHOUT CONTRAST TECHNIQUE: Multidetector CT imaging of the chest was performed following the standard protocol without IV contrast. RADIATION DOSE REDUCTION: This exam was performed according to the departmental dose-optimization program which includes automated exposure control, adjustment of the mA and/or kV according to patient size and/or use of iterative reconstruction technique. COMPARISON:  None Available. FINDINGS: Cardiovascular: Aortic atherosclerosis. Left chest multi lead pacer. Normal heart size. Extensive three-vessel coronary artery calcifications. No pericardial effusion. Mediastinum/Nodes: Small, densely calcified benign granulomatous subcarinal and left hilar lymph nodes, sequelae of prior infection or inflammation. No enlarged mediastinal, hilar, or axillary  lymph nodes. Thyroid gland, trachea, and esophagus demonstrate no significant findings. Lungs/Pleura: Bibasilar scarring or atelectasis with elevation of the right hemidiaphragm. No pleural effusion or pneumothorax. Upper Abdomen: No acute abnormality. Musculoskeletal: No chest wall abnormality. No acute osseous findings. IMPRESSION: 1. No noncontrast CT evidence of malignancy in the chest. 2. Bibasilar scarring or atelectasis with elevation of the right hemidiaphragm. 3. Benign calcific granulomatous lymph nodes. 4. Coronary artery disease. Aortic Atherosclerosis (ICD10-I70.0). Electronically Signed   By: Fredricka Jenny M.D.   On: 10/15/2023 13:47        Scheduled Meds:  allopurinol   300 mg Oral Daily   amiodarone   100 mg Oral Daily   levothyroxine   75 mcg Oral Q0600   polyethylene glycol  34 g Oral Daily   rosuvastatin   20 mg Oral Daily   senna  1 tablet Oral Daily   sodium chloride   1 g Oral TID WC   Continuous Infusions:     LOS: 0 days     Raymonde Calico, MD [facility]   If 7PM-7AM, please contact night-coverage [email or web address] Password [email or web address] 10/16/2023, 3:20 PM

## 2023-10-16 NOTE — Progress Notes (Signed)
 Occupational Therapy Treatment Patient Details Name: Mike Townsend MRN: [id number] DOB: November 02, 1945 Today's Date: 10/16/2023   History of present illness Pt is a 78 year old presenting with syncope, imaging also found R 5th phalanx proximal fracture, Dorsal dislocation right second proximal phalanx    PMH significant for sick sinus s/p pacemaker, a fib, cad, dchf, htn, ckd 3b   OT comments  Pt is supine in bed on arrival. Pleasant and agreeable to PT/OT co-tx session to maximize safety. He continue to have LLE pain with movement. BP in supine checked by NT =126/66. Pt performed supine to sit at EOB with Mod A for BLE management d/t pain and weakness. BP in RUE initially 66/46, however pt not reporting dizziness, just fatigue and neck strain. Moved BP cuff to L arm and checked again 79/58, then again improved to 91/56.  Pt required intermittent assist for seated balance d/t back and neck pain-SBA to Mod A with fatigue. Max A to don post op shoe and L shoe. Mod A x2 for STS from EOB (significantly elevated) to RW with ~30 second standing tolerance. Max A to adjust underwear in standing over hips. TED hose obtained after discussion with nurse and donned with return to supine with total assist. Nurse also to get abdominal binder ordered to trial with pt. He was able to scoot to [facility] with supervision, increased time. Pt left with all needs in place and will cont to require skilled acute OT services to maximize his safety and IND to return to PLOF.       If plan is discharge home, recommend the following:  Help with stairs or ramp for entrance;Assistance with cooking/housework;A lot of help with bathing/dressing/bathroom;A lot of help with walking and/or transfers   Equipment Recommendations  BSC/3in1;Other (comment) (RW)    Recommendations for Other Services      Precautions / Restrictions Precautions Precautions: Fall Recall of Precautions/Restrictions: Intact Precaution/Restrictions Comments:  DONNED BIL TED HOSE, pt to get abdominal binder Restrictions Weight Bearing Restrictions Per Provider Order: Yes RLE Weight Bearing Per Provider Order: Weight bearing as tolerated       Mobility Bed Mobility Overal bed mobility: Needs Assistance Bed Mobility: Supine to Sit, Sit to Supine     Supine to sit: Mod assist, HOB elevated, Used rails Sit to supine: Mod assist, Used rails   General bed mobility comments: mostly LLE assist d/t pain; able to reposition self in bed with cueing and supervision    Transfers Overall transfer level: Needs assistance Equipment used: Rolling walker (2 wheels) Transfers: Sit to/from Stand Sit to Stand: Mod assist, From elevated surface, +2 physical assistance           General transfer comment: significantly elevated bed height with LLE blocking     Balance Overall balance assessment: Needs assistance Sitting-balance support: Feet supported Sitting balance-Leahy Scale: Fair Sitting balance - Comments: intermittent SBA to Mod A at most d/t fatigue and neck strain   Standing balance support: Reliant on assistive device for balance, Bilateral upper extremity supported Standing balance-Leahy Scale: Poor Standing balance comment: mod A X2 at RW                           ADL either performed or assessed with clinical judgement   ADL Overall ADL's : Needs assistance/impaired                     Lower Body Dressing: Maximal assistance Lower  Body Dressing Details (indicate cue type and reason): post op shoe and croc, to pull underwear over hips in standing                    Extremity/Trunk Assessment              Vision       Perception     Praxis     Communication Communication Communication: No apparent difficulties   Cognition Arousal: Alert Behavior During Therapy: WFL for tasks assessed/performed                                 Following commands: Intact        Cueing       Exercises Other Exercises Other Exercises: donned bil thigh high ted hose at end of session to help manage/regulate BP.    Shoulder Instructions       General Comments LLE pain, +orthostatics; 126/66 supine; 66/46 in R arm, moved to L arm and improved to 79/58, then improved to 91/56; pt with no dizziness, just fatigue and neck strain    Pertinent Vitals/ Pain       Pain Assessment Pain Assessment: Faces Faces Pain Scale: Hurts even more Pain Location: L Hip Pain Descriptors / Indicators: Discomfort, Sore Pain Intervention(s): Monitored during session, Limited activity within patient's tolerance, Repositioned  Home Living                                          Prior Functioning/Environment              Frequency  Min 2X/week        Progress Toward Goals  OT Goals(current goals can now be found in the care plan section)  Progress towards OT goals: Progressing toward goals  Acute Rehab OT Goals Patient Stated Goal: get stronger OT Goal Formulation: With patient Time For Goal Achievement: 10/27/23 Potential to Achieve Goals: Good  Plan      Co-evaluation    PT/OT/SLP Co-Evaluation/Treatment: Yes Reason for Co-Treatment: For patient/therapist safety;To address functional/ADL transfers PT goals addressed during session: Mobility/safety with mobility;Proper use of DME OT goals addressed during session: ADL's and self-care      AM-PAC OT "6 Clicks" Daily Activity     Outcome Measure   Help from another person eating meals?: None Help from another person taking care of personal grooming?: None Help from another person toileting, which includes using toliet, bedpan, or urinal?: A Lot Help from another person bathing (including washing, rinsing, drying)?: A Lot Help from another person to put on and taking off regular upper body clothing?: None Help from another person to put on and taking off regular lower body clothing?: A Lot 6 Click  Score: 18    End of Session Equipment Utilized During Treatment: Rolling walker (2 wheels)  OT Visit Diagnosis: Other abnormalities of gait and mobility (R26.89)   Activity Tolerance Patient limited by fatigue   Patient Left in bed;with call bell/phone within reach;with bed alarm set;with family/visitor present   Nurse Communication Mobility status (nurse to order abdominal binder and TED hose)        Time: 4098-1191 OT Time Calculation (min): 32 min  Charges: OT General Charges $OT Visit: 1 Visit OT Treatments $Therapeutic Activity: 8-22 mins  Zori Benbrook, OTR/L  10/16/23,  1:13 PM   Raveen Wieseler E Audie Wieser 10/16/2023, 1:08 PM

## 2023-10-16 NOTE — Progress Notes (Signed)
 Physical Therapy Treatment Patient Details Name: Mike Townsend MRN: [id number] DOB: 1945-08-05 Today's Date: 10/16/2023   History of Present Illness Pt is a 78 year old presenting with syncope, imaging also found R 5th phalanx proximal fracture, Dorsal dislocation right second proximal phalanx    PMH significant for sick sinus s/p pacemaker, a fib, cad, dchf, htn, ckd 3b    PT Comments  Some improvement in upright activity tolerance this session. MAP of 55, 65, 67 all taken while sitting with several minutes between each reading. No "dizziness" reported, however patient does complain of feeling fatigue.  Patient was able to stand with +2 person assistance from elevated bed height using rolling walker. Up to Mod A required to maintain sitting balance for trunk support due to fatigue also. At this time, anticipate patient will need rehabilitation <  3 hours/day after this hospital stay. PT will continue to follow with plans to use abdominal binder and TED hose during next session.    If plan is discharge home, recommend the following: A lot of help with walking and/or transfers;A lot of help with bathing/dressing/bathroom;Assistance with cooking/housework;Help with stairs or ramp for entrance;Assist for transportation   Can travel by private vehicle     No  Equipment Recommendations  Rolling walker (2 wheels);Wheelchair (measurements PT)    Recommendations for Other Services       Precautions / Restrictions Precautions Precautions: Fall Recall of Precautions/Restrictions: Intact Precaution/Restrictions Comments: got TED hose during session, abdominal binder ordered per nurse Restrictions Weight Bearing Restrictions Per Provider Order: Yes RLE Weight Bearing Per Provider Order: Weight bearing as tolerated Other Position/Activity Restrictions: surgical shoe     Mobility  Bed Mobility Overal bed mobility: Needs Assistance Bed Mobility: Supine to Sit, Sit to Supine     Supine to sit:  Mod assist, HOB elevated, Used rails Sit to supine: Mod assist, Used rails   General bed mobility comments: assistance for LLE support and for scooting. increased time and effort with occasional cues provided    Transfers Overall transfer level: Needs assistance Equipment used: Rolling walker (2 wheels) Transfers: Sit to/from Stand Sit to Stand: Mod assist, +2 physical assistance, From elevated surface           General transfer comment: LLE blocked. cues for technique    Ambulation/Gait               General Gait Details: unable to at this time due to limited standing tolerance   Stairs             Wheelchair Mobility     Tilt Bed    Modified Rankin (Stroke Patients Only)       Balance Overall balance assessment: Needs assistance Sitting-balance support: Feet supported Sitting balance-Leahy Scale: Fair Sitting balance - Comments: up to Mod A required due to fatigue   Standing balance support: Reliant on assistive device for balance, Bilateral upper extremity supported Standing balance-Leahy Scale: Poor Standing balance comment: external support required in addition to rolling walker. posture is flexed and left knee blocked for safety                            Communication Communication Communication: No apparent difficulties  Cognition Arousal: Alert Behavior During Therapy: WFL for tasks assessed/performed   PT - Cognitive impairments: No apparent impairments  Following commands: Intact      Cueing    Exercises Other Exercises Other Exercises: bilateral TED hose donned at end of session when nurse brought them to the room    General Comments General comments (skin integrity, edema, etc.): seated MAP was initially 55, increased to 65, and then 67 when taken several minutes apart. no "dizziness" reported, however patient does report feeling extreme fatigue      Pertinent Vitals/Pain Pain  Assessment Pain Assessment: Faces Faces Pain Scale: Hurts even more Pain Location: lateral left leg Pain Descriptors / Indicators: Discomfort, Sore Pain Intervention(s): Limited activity within patient's tolerance, Monitored during session, Repositioned    Home Living                          Prior Function            PT Goals (current goals can now be found in the care plan section) Acute Rehab PT Goals Patient Stated Goal: to go home PT Goal Formulation: With patient Time For Goal Achievement: 10/27/23 Potential to Achieve Goals: Good Progress towards PT goals: Progressing toward goals    Frequency    Min 2X/week      PT Plan      Co-evaluation PT/OT/SLP Co-Evaluation/Treatment: Yes Reason for Co-Treatment: For patient/therapist safety;To address functional/ADL transfers PT goals addressed during session: Mobility/safety with mobility;Proper use of DME OT goals addressed during session: ADL's and self-care      AM-PAC PT "6 Clicks" Mobility   Outcome Measure  Help needed turning from your back to your side while in a flat bed without using bedrails?: A Little Help needed moving from lying on your back to sitting on the side of a flat bed without using bedrails?: A Lot Help needed moving to and from a bed to a chair (including a wheelchair)?: Total Help needed standing up from a chair using your arms (e.g., wheelchair or bedside chair)?: Total Help needed to walk in hospital room?: Total Help needed climbing 3-5 steps with a railing? : Total 6 Click Score: 9    End of Session   Activity Tolerance: Patient limited by fatigue Patient left: in bed;with call bell/phone within reach;with bed alarm set;with family/visitor present Nurse Communication: Mobility status (nurse present for part of session) PT Visit Diagnosis: Unsteadiness on feet (R26.81);Muscle weakness (generalized) (M62.81)     Time: 1610-9604 PT Time Calculation (min) (ACUTE ONLY): 32  min  Charges:    $Therapeutic Activity: 8-22 mins PT General Charges $$ ACUTE PT VISIT: 1 Visit                     Ozie Bo, PT, MPT    Erlene Hawks 10/16/2023, 1:42 PM

## 2023-10-17 DIAGNOSIS — R55 Syncope and collapse: Secondary | ICD-10-CM | POA: Diagnosis not present

## 2023-10-17 LAB — HCV AB W REFLEX TO QUANT PCR: HCV Ab: NONREACTIVE

## 2023-10-17 LAB — CBC
HCT: 26.3 % — ABNORMAL LOW (ref 39.0–52.0)
Hemoglobin: 9.1 g/dL — ABNORMAL LOW (ref 13.0–17.0)
MCH: 38.7 pg — ABNORMAL HIGH (ref 26.0–34.0)
MCHC: 34.6 g/dL (ref 30.0–36.0)
MCV: 111.9 fL — ABNORMAL HIGH (ref 80.0–100.0)
Platelets: 148 10*3/uL — ABNORMAL LOW (ref 150–400)
RBC: 2.35 MIL/uL — ABNORMAL LOW (ref 4.22–5.81)
RDW: 14.6 % (ref 11.5–15.5)
WBC: 6.4 10*3/uL (ref 4.0–10.5)
nRBC: 0 % (ref 0.0–0.2)

## 2023-10-17 LAB — HCV INTERPRETATION

## 2023-10-17 LAB — BASIC METABOLIC PANEL WITH GFR
Anion gap: 6 (ref 5–15)
BUN: 24 mg/dL — ABNORMAL HIGH (ref 8–23)
CO2: 25 mmol/L (ref 22–32)
Calcium: 8.4 mg/dL — ABNORMAL LOW (ref 8.9–10.3)
Chloride: 100 mmol/L (ref 98–111)
Creatinine, Ser: 1.42 mg/dL — ABNORMAL HIGH (ref 0.61–1.24)
GFR, Estimated: 51 mL/min — ABNORMAL LOW (ref >60)
Glucose, Bld: 99 mg/dL (ref 70–99)
Potassium: 4.1 mmol/L (ref 3.5–5.1)
Sodium: 131 mmol/L — ABNORMAL LOW (ref 135–145)

## 2023-10-17 LAB — CK: Total CK: 91 U/L (ref 49–397)

## 2023-10-17 NOTE — Progress Notes (Signed)
 Occupational Therapy Treatment Patient Details Name: Mike Townsend MRN: [id number] DOB: 01-11-1946 Today's Date: 10/17/2023   History of present illness Pt is a 78 year old presenting with syncope, imaging also found R 5th phalanx proximal fracture, Dorsal dislocation right second proximal phalanx    PMH significant for sick sinus s/p pacemaker, a fib, cad, dchf, htn, ckd 3b   OT comments  Pt seen for OT treatment on this date. Upon arrival to room pt semi supine with wife at bedside, pt agreeable to tx. Pt requires MINA to come to sit on the EOB with assistance for LLE. Pt eager to sit up and stretch on the EOB. Pt tolerated supine/sitting up orthostatics; Supine: 136/73(MAP88), HR 60bpm, sp02 levels 98%. Sitting on EOB: 82/48(60MAP), HR 70bpm, Sp02 levels 100%. Pt required MODA with his LE's to return to supine. Pt tolerated sitting on the EOB for ~10 minutes with single UE support. Pt reports how  much easier bed mobility is getting, and that he feels like he is getting much better. Deferred further mobility due positive orthostatics and reported LLE pain. Pt making good progress toward goals, will continue to follow POC. Discharge recommendation remains appropriate.        If plan is discharge home, recommend the following:  Help with stairs or ramp for entrance;Assistance with cooking/housework;A lot of help with bathing/dressing/bathroom;A lot of help with walking and/or transfers   Equipment Recommendations  BSC/3in1;Other (comment)    Recommendations for Other Services      Precautions / Restrictions Precautions Precautions: Fall Recall of Precautions/Restrictions: Intact Precaution/Restrictions Comments: TED hose, abdominal binder Restrictions Weight Bearing Restrictions Per Provider Order: Yes RLE Weight Bearing Per Provider Order: Weight bearing as tolerated Other Position/Activity Restrictions: surgical shoe       Mobility Bed Mobility Overal bed mobility: Needs  Assistance Bed Mobility: Supine to Sit, Sit to Supine     Supine to sit: Min assist, HOB elevated, Used rails Sit to supine: Mod assist, HOB elevated, Used rails   General bed mobility comments: Eager to complete bed mobility; MINA supine <> sit, MODA sit <> supine. LE support throughout    Transfers                   General transfer comment: NT on this date, limited due to BP.     Balance Overall balance assessment: Needs assistance Sitting-balance support: Feet supported Sitting balance-Leahy Scale: Fair Sitting balance - Comments: Steady reaching within BOS                                   ADL either performed or assessed with clinical judgement   ADL Overall ADL's : Needs assistance/impaired Eating/Feeding: Set up;Sitting Eating/Feeding Details (indicate cue type and reason): EOB   Grooming Details (indicate cue type and reason): Declined grooming tasks due to wife assisting pt this AM                               General ADL Comments: Defered ADL tasks due to pain and tasks that were completed this AM    Communication Communication Communication: No apparent difficulties   Cognition Arousal: Alert Behavior During Therapy: WFL for tasks assessed/performed Cognition: No apparent impairments             OT - Cognition Comments: A/Ox4  Following commands: Intact        Cueing   Cueing Techniques: Verbal cues  Exercises Exercises: Other exercises Other Exercises Other Exercises: Edu: Orthostatics vitials, safe bed mobility    Shoulder Instructions       General Comments Orthostatics takes during session; unable to tolerate standing vitals due to pain/+orthostatics    Pertinent Vitals/ Pain       Pain Assessment Pain Assessment: Faces Faces Pain Scale: Hurts even more Pain Location: lateral left leg Pain Descriptors / Indicators: Discomfort, Sore Pain Intervention(s): Limited activity  within patient's tolerance, Monitored during session  Home Living                                          Prior Functioning/Environment              Frequency  Min 2X/week        Progress Toward Goals  OT Goals(current goals can now be found in the care plan section)  Progress towards OT goals: Progressing toward goals  Acute Rehab OT Goals OT Goal Formulation: With patient Time For Goal Achievement: 10/27/23 Potential to Achieve Goals: Good ADL Goals Pt Will Perform Grooming: with modified independence;sitting;standing Pt Will Perform Lower Body Dressing: with modified independence;sit to/from stand;sitting/lateral leans Pt Will Transfer to Toilet: with modified independence;ambulating Pt Will Perform Toileting - Clothing Manipulation and hygiene: with modified independence;sit to/from stand;sitting/lateral leans  Plan      Co-evaluation                 AM-PAC OT "6 Clicks" Daily Activity     Outcome Measure   Help from another person eating meals?: None Help from another person taking care of personal grooming?: None Help from another person toileting, which includes using toliet, bedpan, or urinal?: A Lot Help from another person bathing (including washing, rinsing, drying)?: A Lot Help from another person to put on and taking off regular upper body clothing?: None Help from another person to put on and taking off regular lower body clothing?: A Lot 6 Click Score: 18    End of Session    OT Visit Diagnosis: Other abnormalities of gait and mobility (R26.89)   Activity Tolerance Patient tolerated treatment well   Patient Left in bed;with call bell/phone within reach;with bed alarm set;with family/visitor present   Nurse Communication          Time: 1610-9604 OT Time Calculation (min): 14 min  Charges: OT General Charges $OT Visit: 1 Visit OT Treatments $Self Care/Home Management : 8-22 mins  Rosaria Common M.S. OTR/L   10/17/23, 4:17 PM

## 2023-10-17 NOTE — Progress Notes (Signed)
 PROGRESS NOTE    Mike Townsend  AVW:[id number] DOB: 02-25-1946 DOA: 10/13/2023 PCP: [name], MD     Brief Narrative:   Mike Townsend is a 78 y.o. male with medical history significant for sick sinus s/p pacemaker, a fib, cad, dchf, htn, ckd 3b, presenting with the above.   Chronic problem, happens from time to time, but happened twice in past two days, fell and hurt toes and knees and left hip today. Always happens when standing, today shortly after getting out of bed, yesterday while standing urinating. Gets lightheaded. No chest pain or palpitations. No recent illness, no fever, nausea, vomiting, or diarrhea. No head injury     Assessment & Plan:   Principal Problem:   Syncope Active Problems:   Sick sinus syndrome (HCC)   Bradycardia   NSTEMI (non-ST elevated myocardial infarction) (HCC)   Coronary artery disease due to lipid rich plaque   Dilated cardiomyopathy (HCC)   Essential hypertension   Paroxysmal atrial fibrillation (HCC)   Stage 3b chronic kidney disease (HCC)   SSS (sick sinus syndrome) (HCC)   Hypothyroidism   Closed dislocation of second toe of right foot   # Syncope, orthostatic Appears to be orthostatic. Is on metop and losartan . Longstanding issue, but more frequent as of late. TTE last month so holding on repeating. Unremarkable pacemaker interrogation. Appreciate cardiology recs, advise holding BB. We are also trying TED hose and abdominal binder - holding BB and losartan  - salt tablets as below - check CK given complaint of generalized weakness   # right 5th phalanx proximal fracture # Dorsal dislocation right second proximal phalanx.  Podiatry has consulted - continue buddy tape of 2nd toe, wvat, f/u 2 weeks dr standiford   # Left gluteal hematoma Ct obtained for ongoing pain, intramuscular hematoma identified. Appears stable. Gave TXA - apixaban  on hold  # Blood loss anemia 2/2 above hematoma, hgb has trended to 9.5 last 2 days, 9 today  and 9.1 today - holding apixaban  - monitor for now  # Hyponatremia Appears euvolemic. Labs consistent w/ siadh. Salt tabs started 6/7, sodium has improved 125>131. Cortisol and tsh wnl. CT chest nothing acute - continue salt tabs   # Debility PT advising snf, patient agreeable - TOC consulted, bed search underway   # HTN Bp appropriate - home meds held   # A-fib Av paced rhythm, s/p multiple recent cardioversions. Chads2vasc of 3 - continue home amio, holding home BB - hold home apixaban  given frequent falls, hematoma. Will plan on holding at discharge, can consider resuming as outpatient if orthostasis and frequent falls better controlled   # Sick sinus S/p pacer. Cardiology says pacemaker interrogated, no issues.   # Hypothyroid Tsh wnl - home synthroid    # HFpEF Euvolemic  # CKD 3a At baseline - monitor   # Macrocytosis b12, folate wnl, smear unremarkable   # Thrombocytopenia Hiv neg, smear unremarkable. Stable, mild - f/u hcv  # Constipation - continue miralax/senna   DVT prophylaxis: SCDs Code Status: full Family Communication: wife at bedside 6/10 Consults called: podiatry, cardiology   Level of care: Telemetry Cardiac Status is: Observation     Consultants:  cardiology  Procedures: none  Antimicrobials:  none    Subjective: Daily improvement but still lightheaded with PT   Objective: Vitals:   10/16/23 2320 10/17/23 0339 10/17/23 0736 10/17/23 1253  BP: 127/77 115/76 125/74 121/66  Pulse: 63 61 64 61  Resp: 20 20    Temp: 99.7 F (  37.6 C) 98.5 F (36.9 C) 99.4 F (37.4 C) 99.4 F (37.4 C)  TempSrc: Oral Oral  Oral  SpO2: 97% 96% 98% 99%  Weight:      Height:        Intake/Output Summary (Last 24 hours) at 10/17/2023 1557 Last data filed at 10/17/2023 1032 Gross per 24 hour  Intake 240 ml  Output 575 ml  Net -335 ml   Filed Weights   10/13/23 0325  Weight: 106.6 kg    Examination:  General exam: Appears calm and  comfortable  Respiratory system: Clear to auscultation. Respiratory effort normal. Cardiovascular system: S1 & S2 heard, RRR.   Gastrointestinal system: Abdomen is nondistended, soft and nontender.   Central nervous system: Alert and oriented. No focal neurological deficits. Extremities: Symmetric 5 x 5 power.  Skin: bruising left buttock/flank Psychiatry: Judgement and insight appear normal. Mood & affect appropriate.     Data Reviewed: I have personally reviewed following labs and imaging studies  CBC: Recent Labs  Lab 10/13/23 0330 10/13/23 0855 10/14/23 0511 10/14/23 1851 10/15/23 0426 10/16/23 0418 10/17/23 0623  WBC 7.2 7.0 7.5  --  8.0 7.5 6.4  NEUTROABS 5.5 5.3  --   --   --   --   --   HGB 12.6* 10.9* 10.6* 9.5* 9.5* 9.0* 9.1*  HCT 36.7* 31.6* 31.0*  --  27.5* 25.8* 26.3*  MCV 112.9* 110.9* 111.9*  --  110.0* 109.8* 111.9*  PLT 131* 124* 119*  --  115* 124* 148*   Basic Metabolic Panel: Recent Labs  Lab 10/13/23 0559 10/14/23 0511 10/15/23 0426 10/16/23 0418 10/17/23 0623  NA 129* 129* 125* 128* 131*  K 4.2 4.3 4.3 4.3 4.1  CL 99 99 97* 99 100  CO2 23 24 22  21* 25  GLUCOSE 117* 102* 106* 104* 99  BUN 29* 24* 21 21 24*  CREATININE 1.56* 1.47* 1.42* 1.37* 1.42*  CALCIUM  8.4* 8.4* 8.1* 8.2* 8.4*  MG 2.0  --   --   --   --    GFR: Estimated Creatinine Clearance: 57.6 mL/min (A) (by C-G formula based on SCr of 1.42 mg/dL (H)). Liver Function Tests: Recent Labs  Lab 10/13/23 0559  AST 22  ALT 22  ALKPHOS 56  BILITOT 1.3*  PROT 6.1*  ALBUMIN  3.5   No results for input(s): "LIPASE", "AMYLASE" in the last 168 hours. No results for input(s): "AMMONIA" in the last 168 hours. Coagulation Profile: No results for input(s): "INR", "PROTIME" in the last 168 hours. Cardiac Enzymes: No results for input(s): "CKTOTAL", "CKMB", "CKMBINDEX", "TROPONINI" in the last 168 hours. BNP (last 3 results) No results for input(s): "PROBNP" in the last 8760  hours. HbA1C: No results for input(s): "HGBA1C" in the last 72 hours. CBG: No results for input(s): "GLUCAP" in the last 168 hours. Lipid Profile: No results for input(s): "CHOL", "HDL", "LDLCALC", "TRIG", "CHOLHDL", "LDLDIRECT" in the last 72 hours. Thyroid Function Tests: No results for input(s): "TSH", "T4TOTAL", "FREET4", "T3FREE", "THYROIDAB" in the last 72 hours.  Anemia Panel: No results for input(s): "VITAMINB12", "FOLATE", "FERRITIN", "TIBC", "IRON", "RETICCTPCT" in the last 72 hours.  Urine analysis:    Component Value Date/Time   COLORURINE YELLOW (A) 10/13/2023 0833   APPEARANCEUR CLEAR (A) 10/13/2023 0833   LABSPEC 1.018 10/13/2023 0833   PHURINE 6.0 10/13/2023 0833   GLUCOSEU NEGATIVE 10/13/2023 0833   HGBUR NEGATIVE 10/13/2023 0833   BILIRUBINUR NEGATIVE 10/13/2023 0833   KETONESUR NEGATIVE 10/13/2023 0833   PROTEINUR NEGATIVE  10/13/2023 0833   NITRITE NEGATIVE 10/13/2023 0833   LEUKOCYTESUR NEGATIVE 10/13/2023 0833   Sepsis Labs: @LABRCNTIP (procalcitonin:4,lacticidven:4)  )No results found for this or any previous visit (from the past 240 hours).       Radiology Studies: No results found.       Scheduled Meds:  allopurinol   300 mg Oral Daily   amiodarone   100 mg Oral Daily   levothyroxine   75 mcg Oral Q0600   polyethylene glycol  34 g Oral Daily   rosuvastatin   20 mg Oral Daily   senna  1 tablet Oral Daily   sodium chloride   1 g Oral TID WC   Continuous Infusions:     LOS: 0 days     Raymonde Calico, MD [facility]   If 7PM-7AM, please contact night-coverage [email or web address] Password [facility] 10/17/2023, 3:57 PM

## 2023-10-17 NOTE — TOC Progression Note (Signed)
 Transition of Care [facility]) - Progression Note    Patient Details  Name: Mike Townsend MRN: [id number] Date of Birth: Aug 27, 1945  [facility]) CM/SW Contact  Baird Bombard, RN Phone Number: 10/17/2023, 4:17 PM  Clinical Narrative:    Spoke with patient and family at bedside. To give bed offers for [facility], [facility], [place], [facility]  [facility]. Patient wanted to review bed offers at [facility]. gov before making his decision.          Expected Discharge Plan and Services                                               Social Determinants of Health (SDOH) Interventions SDOH Screenings   Food Insecurity: No Food Insecurity (10/13/2023)  Housing: Unknown (10/13/2023)  Transportation Needs: No Transportation Needs (10/13/2023)  Utilities: Not At Risk (10/13/2023)  Financial Resource Strain: Low Risk  (04/25/2023)   Received from [facility]  Social Connections: Moderately Integrated (10/13/2023)  Tobacco Use: Medium Risk (07/07/2023)   Received from [facility]    Readmission Risk Interventions     No data to display

## 2023-10-17 NOTE — Progress Notes (Addendum)
 Physical Therapy Treatment Patient Details Name: Mike Townsend MRN: [id number] DOB: January 31, 1946 Today's Date: 10/17/2023   History of Present Illness Pt is a 78 year old presenting with syncope, imaging also found R 5th phalanx proximal fracture, Dorsal dislocation right second proximal phalanx    PMH significant for sick sinus s/p pacemaker, a fib, cad, dchf, htn, ckd 3b    PT Comments  Pt was supine (HOB elevated ~ 30 degrees) with TED stocking and abdominal binder in place. He is A and O x 4 and extremely motivated to attempt OOB to [facility] to have BM. Had been unsuccessful on bedpan prior to session however did have successful BM on bedpan at conclusion of session. Pt supportive spouse present throughout session. He continues to be severely limited by orthostatic hypotension. Supine BP 124/68(82) Sitting EOB 61/44(51) pt asymptomatic and cleared by MD to continue Sitting EOB x 5 minutes 71/44(51) Pt remained EOB sitting x ~ 30 minutes throughout session with BP remaining soft. He did stand 1 x from very elevated bed height with +2 assistance(RN assisted author) however once standing begins to profusely sweat and is unable to tolerate standing for more than 10 sec. Pt did not endorse dizziness until standing however author questions if pt was not endorsing true symptoms until unable to tolerate any longer. He wanted to attempt transfer to [facility] so badly but was just unsafe to at this time. Immediately after returning to supine BP 118/63(77). Pt did drink two full bottles of water during session and endorses drinking a lot of fluids prior to session. Author feels pt will progress quickly once hypotension improves. Acute PT will continue to follow and progress as able per pt tolerance. DC recs remain appropriate. Pt remains far from his baseline and will continue to benefit from skilled PT to maximize safety with all ADLs + assisting pt to PLOF.    If plan is discharge home, recommend the following: A lot  of help with walking and/or transfers;A lot of help with bathing/dressing/bathroom;Assistance with cooking/housework;Help with stairs or ramp for entrance;Assist for transportation   Can travel by private vehicle     No  Equipment Recommendations  Rolling walker (2 wheels);Wheelchair (measurements PT);Hoyer lift;Wheelchair cushion (measurements PT);Hospital bed (If unable to resolve orthostatic hypotensive concerns)       Precautions / Restrictions Precautions Precautions: Fall Recall of Precautions/Restrictions: Intact Precaution/Restrictions Comments: TED stocking + abdominal binder Restrictions Weight Bearing Restrictions Per Provider Order: Yes RLE Weight Bearing Per Provider Order: Weight bearing as tolerated Other Position/Activity Restrictions: surgical shoe     Mobility  Bed Mobility Overal bed mobility: Needs Assistance Bed Mobility: Supine to Sit, Sit to Supine     Supine to sit: Mod assist, Max assist Sit to supine: Used rails, Total assist   General bed mobility comments: extensive assistance for LLE progression/advancement. total assistance required to return to supine after EOB activity due to concerns for syncopal episode. pt tolerated EOB sitting x 20-30 minutes. poor flexed posture but is able to correct zi'ly but unable to maintain due to hypotension    Transfers Overall transfer level: Needs assistance Equipment used: Rolling walker (2 wheels) Transfers: Sit to/from Stand Sit to Stand: Mod assist, +2 physical assistance, From elevated surface  General transfer comment: LLE blocked. cues for technique. very poor standing tolerance~ 5-10 sec but unable to maintain due to c/o dizziness + profuse sweating.orthostatic hypotensive. pt very motivated to try increased activity however unsafe at this time.    Ambulation/Gait  General Gait  Details: currently unable to at this time due to limited standing tolerance     Balance Overall balance assessment: Needs  assistance Sitting-balance support: Feet supported Sitting balance-Leahy Scale: Fair Sitting balance - Comments: up to Mod A required due to fatigue   Standing balance support: Reliant on assistive device for balance, Bilateral upper extremity supported Standing balance-Leahy Scale: Poor Standing balance comment: external support required in addition to rolling walker. posture is flexed and left knee blocked for safety       Communication Communication Communication: No apparent difficulties  Cognition Arousal: Alert Behavior During Therapy: WFL for tasks assessed/performed   PT - Cognitive impairments: No apparent impairments    PT - Cognition Comments: pt is A and O x 4. Extremely pleasant and motivated Following commands: Intact      Cueing Cueing Techniques: Tactile cues, Verbal cues  Exercises Other Exercises Other Exercises: bilateral TED hose+ abdominal binder in place throughout session and remains donned post session        Pertinent Vitals/Pain Pain Assessment Pain Assessment: No/denies pain Pain Score: 0-No pain Pain Location: lateral left leg Pain Descriptors / Indicators: Discomfort, Sore           PT Goals (current goals can now be found in the care plan section) Acute Rehab PT Goals Patient Stated Goal: to go home after rehab Progress towards PT goals: Not progressing toward goals - comment    Frequency    Min 2X/week           Co-evaluation PT/OT/SLP Co-Evaluation/Treatment: Yes Reason for Co-Treatment: For patient/therapist safety;To address functional/ADL transfers PT goals addressed during session: Mobility/safety with mobility;Proper use of DME        AM-PAC PT "6 Clicks" Mobility   Outcome Measure  Help needed turning from your back to your side while in a flat bed without using bedrails?: A Lot Help needed moving from lying on your back to sitting on the side of a flat bed without using bedrails?: A Lot Help needed moving to and  from a bed to a chair (including a wheelchair)?: Total Help needed standing up from a chair using your arms (e.g., wheelchair or bedside chair)?: Total Help needed to walk in hospital room?: Total Help needed climbing 3-5 steps with a railing? : Total 6 Click Score: 8    End of Session   Activity Tolerance: Patient limited by fatigue Patient left: in bed;with call bell/phone within reach;with bed alarm set;with family/visitor present Nurse Communication: Mobility status (nurse present for part of session) PT Visit Diagnosis: Unsteadiness on feet (R26.81);Muscle weakness (generalized) (M62.81)     Time: 1122-1205 PT Time Calculation (min) (ACUTE ONLY): 43 min  Charges:    $Therapeutic Activity: 38-52 mins PT General Charges $$ ACUTE PT VISIT: 1 Visit                     Chester Costa PTA 10/17/23, 9:47 PM

## 2023-10-18 DIAGNOSIS — R55 Syncope and collapse: Secondary | ICD-10-CM | POA: Diagnosis not present

## 2023-10-18 LAB — CBC
HCT: 25.4 % — ABNORMAL LOW (ref 39.0–52.0)
Hemoglobin: 8.9 g/dL — ABNORMAL LOW (ref 13.0–17.0)
MCH: 38.9 pg — ABNORMAL HIGH (ref 26.0–34.0)
MCHC: 35 g/dL (ref 30.0–36.0)
MCV: 110.9 fL — ABNORMAL HIGH (ref 80.0–100.0)
Platelets: 167 10*3/uL (ref 150–400)
RBC: 2.29 MIL/uL — ABNORMAL LOW (ref 4.22–5.81)
RDW: 14.5 % (ref 11.5–15.5)
WBC: 6.6 10*3/uL (ref 4.0–10.5)
nRBC: 0 % (ref 0.0–0.2)

## 2023-10-18 LAB — BASIC METABOLIC PANEL WITH GFR
Anion gap: 6 (ref 5–15)
BUN: 25 mg/dL — ABNORMAL HIGH (ref 8–23)
CO2: 23 mmol/L (ref 22–32)
Calcium: 8.3 mg/dL — ABNORMAL LOW (ref 8.9–10.3)
Chloride: 103 mmol/L (ref 98–111)
Creatinine, Ser: 1.39 mg/dL — ABNORMAL HIGH (ref 0.61–1.24)
GFR, Estimated: 53 mL/min — ABNORMAL LOW (ref >60)
Glucose, Bld: 105 mg/dL — ABNORMAL HIGH (ref 70–99)
Potassium: 4.2 mmol/L (ref 3.5–5.1)
Sodium: 132 mmol/L — ABNORMAL LOW (ref 135–145)

## 2023-10-18 NOTE — Progress Notes (Signed)
 Physical Therapy Treatment Patient Details Name: Mike Townsend MRN: [id number] DOB: 03-12-1946 Today's Date: 10/18/2023   History of Present Illness Pt is a 79 year old presenting with syncope, imaging also found R 5th phalanx proximal fracture, Dorsal dislocation right second proximal phalanx    PMH significant for sick sinus s/p pacemaker, a fib, cad, dchf, htn, ckd 3b    PT Comments  PT was broken into two separate shorter session 2/2 to pt's limited activity tolerance. He remains extremely motivated and cooperative. Eager to attempt OOB activity again this session. Cleared by MD previous date to advance activity even in setting of hypotensive BP reading As long as asymptomatic. Overall pt demonstrated much improved abilities to exit bed, and tolerate standing activity. BP did drop as low as 82/44 and 88/30 with MAP below 60 however pt was asymptomatic unlike previous date. Sessions even included getting to/from recliner with use of RW +1 assist. Did take quality steps but does fatigue quickly. Overall pt remains weak and far from his baseline abilities.  He will continue to benefit from skilled PT at DC to maximize independence and safety with all ADLs.    If plan is discharge home, recommend the following: A lot of help with walking and/or transfers;Assistance with cooking/housework;Assist for transportation;Help with stairs or ramp for entrance;A lot of help with bathing/dressing/bathroom   Can travel by private vehicle     No  Equipment Recommendations   (Defer to next level of care)       Precautions / Restrictions Precautions Precautions: Fall Recall of Precautions/Restrictions: Intact Precaution/Restrictions Comments: TED stocking + abdominal binder Restrictions Weight Bearing Restrictions Per Provider Order: Yes RLE Weight Bearing Per Provider Order: Weight bearing as tolerated Other Position/Activity Restrictions: surgical shoe     Mobility  Bed Mobility Overal bed  mobility: Needs Assistance Bed Mobility: Sit to Supine  Supine to sit: Min assist, Mod assist Sit to supine: Min assist General bed mobility comments: Pt was able to progress BLEs back into bed with min assist only. Much improved abilities from previous date.    Transfers Overall transfer level: Needs assistance Equipment used: Rolling walker (2 wheels) Transfers: Sit to/from Stand Sit to Stand: Mod assist, From elevated surface  General transfer comment: pt stood from recliner with mod assist of one. Overall demonstrated much improved transfer abilities versus previous date. BP remains soft however pt does not endorse symptoms    Ambulation/Gait Ambulation/Gait assistance: Min assist Gait Distance (Feet): 3 Feet Assistive device: Rolling walker (2 wheels) Gait Pattern/deviations: Step-to pattern Gait velocity: decreased  General Gait Details: Pt was able to take steps to EOB then to [facility] with RW + min assist. P does endorse fatigue but overall noty nearly as fatigued as previous date.   Balance Overall balance assessment: Needs assistance Sitting-balance support: Feet supported Sitting balance-Leahy Scale: Good Sitting balance - Comments: no LOB in sitting with all extremities support   Standing balance support: Bilateral upper extremity supported, During functional activity, Reliant on assistive device for balance Standing balance-Leahy Scale: Fair Standing balance comment: Pt was able to maintain standing balance with less assistance today. reliant on RW but no LOB or knee buckling observed    Communication Communication Communication: No apparent difficulties  Cognition Arousal: Alert Behavior During Therapy: WFL for tasks assessed/performed   PT - Cognitive impairments: No apparent impairments    PT - Cognition Comments: pt is A and O x 4. Extremely pleasant and motivated Following commands: Intact  Cueing Cueing Techniques: Tactile cues, Verbal cues  Exercises  Other Exercises Other Exercises: bilateral TED hose+ abdominal binder in place throughout session and remains donned post session        Pertinent Vitals/Pain Pain Assessment Pain Assessment: No/denies pain Pain Score: 0-No pain Pain Location: lateral left leg at times but not consistent Pain Descriptors / Indicators: Discomfort, Sore Pain Intervention(s): Limited activity within patient's tolerance, Monitored during session, Premedicated before session, Repositioned     PT Goals (current goals can now be found in the care plan section) Acute Rehab PT Goals Patient Stated Goal: to go home after rehab Progress towards PT goals: Progressing toward goals    Frequency    Min 2X/week           Co-evaluation PT/OT/SLP Co-Evaluation/Treatment: Yes Reason for Co-Treatment: For patient/therapist safety;To address functional/ADL transfers PT goals addressed during session: Mobility/safety with mobility;Balance;Proper use of DME;Strengthening/ROM        AM-PAC PT 6 Clicks Mobility   Outcome Measure  Help needed turning from your back to your side while in a flat bed without using bedrails?: A Little Help needed moving from lying on your back to sitting on the side of a flat bed without using bedrails?: A Lot Help needed moving to and from a bed to a chair (including a wheelchair)?: A Lot Help needed standing up from a chair using your arms (e.g., wheelchair or bedside chair)?: A Lot Help needed to walk in hospital room?: A Lot Help needed climbing 3-5 steps with a railing? : Total 6 Click Score: 12    End of Session   Activity Tolerance: Patient tolerated treatment well;Patient limited by fatigue Patient left: in bed;with call bell/phone within reach;with bed alarm set;with family/visitor present Nurse Communication: Mobility status PT Visit Diagnosis: Unsteadiness on feet (R26.81);Muscle weakness (generalized) (M62.81)     Time: 1478-2956 PT Time Calculation (min)  (ACUTE ONLY): 14 min  Charges:    $Therapeutic Activity: 8-22 mins PT General Charges $$ ACUTE PT VISIT: 1 Visit                    Chester Costa PTA 10/18/23, 5:25 PM

## 2023-10-18 NOTE — TOC Progression Note (Addendum)
 Transition of Care [facility]) - Progression Note    Patient Details  Name: Mike Townsend MRN: [id number] Date of Birth: 11-22-1945  Transition of Care [facility]) CM/SW Contact  Baird Bombard, RN Phone Number: 10/18/2023, 2:12 PM  Clinical Narrative:    Spoke with patient at bedside. He is requesting the bed offer for [facility]. He was advised [employer] [facility] has to be obtained and will be started today.   1:21 [employer] auth started. Left a message for [name] at [facility] advising patient accepting bed and is medically stable.          Expected Discharge Plan and Services                                               Social Determinants of Health (SDOH) Interventions SDOH Screenings   Food Insecurity: No Food Insecurity (10/13/2023)  Housing: Unknown (10/13/2023)  Transportation Needs: No Transportation Needs (10/13/2023)  Utilities: Not At Risk (10/13/2023)  Financial Resource Strain: Low Risk  (04/25/2023)   Received from [facility]  Social Connections: Moderately Integrated (10/13/2023)  Tobacco Use: Medium Risk (07/07/2023)   Received from [facility]    Readmission Risk Interventions     No data to display

## 2023-10-18 NOTE — Plan of Care (Signed)
  Problem: Education: Goal: Knowledge of General Education information will improve Description: Including pain rating scale, medication(s)/side effects and non-pharmacologic comfort measures Outcome: Progressing   Problem: Health Behavior/Discharge Planning: Goal: Ability to manage health-related needs will improve Outcome: Progressing   Problem: Activity: Goal: Risk for activity intolerance will decrease Outcome: Progressing   Problem: Coping: Goal: Level of anxiety will decrease Outcome: Progressing   Problem: Elimination: Goal: Will not experience complications related to bowel motility Outcome: Progressing   Problem: Pain Managment: Goal: General experience of comfort will improve and/or be controlled Outcome: Progressing   Problem: Safety: Goal: Ability to remain free from injury will improve Outcome: Progressing

## 2023-10-18 NOTE — Progress Notes (Signed)
 PROGRESS NOTE    Mike Townsend  ZOX:[id number] DOB: 05/23/1945 DOA: 10/13/2023 PCP: [name], MD   Assessment & Plan:   Principal Problem:   Syncope Active Problems:   Sick sinus syndrome (HCC)   Bradycardia   NSTEMI (non-ST elevated myocardial infarction) (HCC)   Coronary artery disease due to lipid rich plaque   Dilated cardiomyopathy (HCC)   Essential hypertension   Paroxysmal atrial fibrillation (HCC)   Stage 3b chronic kidney disease (HCC)   SSS (sick sinus syndrome) (HCC)   Hypothyroidism   Closed dislocation of second toe of right foot  Assessment and Plan: Syncope: likely secondary to orthostatic hypotension. Chronic issue but happening more frequently. Echo was done evidently last month as per pt. Unremarkable pacemaker interrogation. Holding home metoprolol , losartan . Continue w/ TED hose, abd binder.   Appreciate cardiology recs, advise holding BB. We are also trying TED hose and abdominal binder. CK is WNL    Right 5th phalanx proximal fracture: & dorsal dislocation of right second proximal phalanx. Continue w/ buddy tape of toe. F/u outpatient w/ ortho surg, Dr. Rosemarie Conquest, in 2 weeks. WBAT in post op shoe to right foot as per podiatry     Left gluteal hematoma: as per CT. S/p TXA. Holding eliquis . Continue w/ supportive care   Blood loss anemia: likely secondary to above hematoma. Holding eliquis . Will continue to monitor H&H   Hyponatremia: trending up. Continue w/ salt tab. 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 Waiting on SNF placement    HTN: BP is WNL currently. Holding home BP meds    PAF: continue on home dose of amio and holding eliquis . High fall risk. Hold eliquis  at d/c and f/u outpatient w/ cardio to decide when and if to restart    Sick sinus: s/p pacemaker. Cardiology says pacemaker interrogated, no issues.   Hypothyroidism: continue on home dose of synthroid     Chronic systolic CHF: echo in 2020 shows EF 40-45%, normal  diastolic function. Appears compensated    CKDIIIa: Cr is trending down from day prior. Avoid nephrotoxic meds.    Macrocytic anemia: B12, folate WNL. Smear unremarkable. Will continue to monitor    Thrombocytopenia: resolved   Constipation: continue on miralax, senna   Obesity: BMI 30.1. Would benefit from weight loss     DVT prophylaxis: SCDs Code Status: full  Family Communication: discussed pt's care w/ pt's family at bedside and answered their questions  Disposition Plan: needs SNF placement still  Status is: Observation The patient remains OBS appropriate and will d/c before 2 midnights.    Level of care: Telemetry Cardiac Consultants:    Procedures:   Antimicrobials:    Subjective: Pt c/o generalized weakness  Objective: Vitals:   10/18/23 0047 10/18/23 0048 10/18/23 0426 10/18/23 0729  BP: 138/84 138/84 125/71 138/72  Pulse: 63 63 60 (!) 59  Resp: 20 20 18 20   Temp: 98.9 F (37.2 C) 98.9 F (37.2 C) (!) 97.1 F (36.2 C) 98.4 F (36.9 C)  TempSrc:      SpO2: 96% 98% 96% 98%  Weight:      Height:        Intake/Output Summary (Last 24 hours) at 10/18/2023 0956 Last data filed at 10/18/2023 0900 Gross per 24 hour  Intake 240 ml  Output 800 ml  Net -560 ml   Filed Weights   10/13/23 0325  Weight: 106.6 kg    Examination:  General exam: Appears calm and comfortable  Respiratory system: Clear  to auscultation. Respiratory effort normal. Cardiovascular system: S1 & S2+. No  rubs, gallops or clicks.  Gastrointestinal system: Abdomen is obese, soft and nontender. Normal bowel sounds heard. Central nervous system: Alert and oriented. Moves all extremities  Psychiatry: Judgement and insight appear normal. Mood & affect appropriate.     Data Reviewed: I have personally reviewed following labs and imaging studies  CBC: Recent Labs  Lab 10/13/23 0330 10/13/23 0855 10/14/23 0511 10/14/23 1851 10/15/23 0426 10/16/23 0418 10/17/23 0623  10/18/23 0415  WBC 7.2 7.0 7.5  --  8.0 7.5 6.4 6.6  NEUTROABS 5.5 5.3  --   --   --   --   --   --   HGB 12.6* 10.9* 10.6* 9.5* 9.5* 9.0* 9.1* 8.9*  HCT 36.7* 31.6* 31.0*  --  27.5* 25.8* 26.3* 25.4*  MCV 112.9* 110.9* 111.9*  --  110.0* 109.8* 111.9* 110.9*  PLT 131* 124* 119*  --  115* 124* 148* 167   Basic Metabolic Panel: Recent Labs  Lab 10/13/23 0559 10/14/23 0511 10/15/23 0426 10/16/23 0418 10/17/23 0623 10/18/23 0415  NA 129* 129* 125* 128* 131* 132*  K 4.2 4.3 4.3 4.3 4.1 4.2  CL 99 99 97* 99 100 103  CO2 23 24 22  21* 25 23  GLUCOSE 117* 102* 106* 104* 99 105*  BUN 29* 24* 21 21 24* 25*  CREATININE 1.56* 1.47* 1.42* 1.37* 1.42* 1.39*  CALCIUM  8.4* 8.4* 8.1* 8.2* 8.4* 8.3*  MG 2.0  --   --   --   --   --    GFR: Estimated Creatinine Clearance: 58.8 mL/min (A) (by C-G formula based on SCr of 1.39 mg/dL (H)). Liver Function Tests: Recent Labs  Lab 10/13/23 0559  AST 22  ALT 22  ALKPHOS 56  BILITOT 1.3*  PROT 6.1*  ALBUMIN  3.5   No results for input(s): LIPASE, AMYLASE in the last 168 hours. No results for input(s): AMMONIA in the last 168 hours. Coagulation Profile: No results for input(s): INR, PROTIME in the last 168 hours. Cardiac Enzymes: Recent Labs  Lab 10/17/23 1629  CKTOTAL 91   BNP (last 3 results) No results for input(s): PROBNP in the last 8760 hours. HbA1C: No results for input(s): HGBA1C in the last 72 hours. CBG: No results for input(s): GLUCAP in the last 168 hours. Lipid Profile: No results for input(s): CHOL, HDL, LDLCALC, TRIG, CHOLHDL, LDLDIRECT in the last 72 hours. Thyroid Function Tests: No results for input(s): TSH, T4TOTAL, FREET4, T3FREE, THYROIDAB in the last 72 hours. Anemia Panel: No results for input(s): VITAMINB12, FOLATE, FERRITIN, TIBC, IRON, RETICCTPCT in the last 72 hours. Sepsis Labs: No results for input(s): PROCALCITON, LATICACIDVEN in the last 168  hours.  No results found for this or any previous visit (from the past 240 hours).       Radiology Studies: No results found.      Scheduled Meds:  allopurinol   300 mg Oral Daily   amiodarone   100 mg Oral Daily   levothyroxine   75 mcg Oral Q0600   polyethylene glycol  34 g Oral Daily   rosuvastatin   20 mg Oral Daily   senna  1 tablet Oral Daily   sodium chloride   1 g Oral TID WC   Continuous Infusions:   LOS: 0 days       Alphonsus Jeans, MD [facility] Pager [phone]  If 7PM-7AM, please contact night-coverage [email or web address] 10/18/2023, 9:56 AM

## 2023-10-18 NOTE — Plan of Care (Signed)

## 2023-10-19 ENCOUNTER — Encounter: Payer: Self-pay | Admitting: Obstetrics and Gynecology

## 2023-10-19 DIAGNOSIS — R55 Syncope and collapse: Secondary | ICD-10-CM | POA: Diagnosis not present

## 2023-10-19 LAB — BASIC METABOLIC PANEL WITH GFR
Anion gap: 8 (ref 5–15)
BUN: 23 mg/dL (ref 8–23)
CO2: 22 mmol/L (ref 22–32)
Calcium: 8.4 mg/dL — ABNORMAL LOW (ref 8.9–10.3)
Chloride: 103 mmol/L (ref 98–111)
Creatinine, Ser: 1.28 mg/dL — ABNORMAL HIGH (ref 0.61–1.24)
GFR, Estimated: 58 mL/min — ABNORMAL LOW (ref >60)
Glucose, Bld: 102 mg/dL — ABNORMAL HIGH (ref 70–99)
Potassium: 4.4 mmol/L (ref 3.5–5.1)
Sodium: 133 mmol/L — ABNORMAL LOW (ref 135–145)

## 2023-10-19 LAB — CBC
HCT: 26.9 % — ABNORMAL LOW (ref 39.0–52.0)
Hemoglobin: 9.2 g/dL — ABNORMAL LOW (ref 13.0–17.0)
MCH: 38.2 pg — ABNORMAL HIGH (ref 26.0–34.0)
MCHC: 34.2 g/dL (ref 30.0–36.0)
MCV: 111.6 fL — ABNORMAL HIGH (ref 80.0–100.0)
Platelets: 179 10*3/uL (ref 150–400)
RBC: 2.41 MIL/uL — ABNORMAL LOW (ref 4.22–5.81)
RDW: 14.2 % (ref 11.5–15.5)
WBC: 6.1 10*3/uL (ref 4.0–10.5)
nRBC: 0 % (ref 0.0–0.2)

## 2023-10-19 MED ORDER — OXYCODONE HCL 5 MG PO TABS: 5.0000 mg | ORAL_TABLET | Freq: Four times a day (QID) | ORAL | 0 refills | Status: AC | PRN

## 2023-10-19 MED ORDER — SODIUM CHLORIDE 1 G PO TABS: 1.0000 g | ORAL_TABLET | Freq: Three times a day (TID) | ORAL | Status: AC

## 2023-10-19 NOTE — Progress Notes (Signed)
 Pt alert and oriented. VSS. Discharge instructions reviewed with pt. Pt verbalized understanding. Report called to Lexine Redder RN at [facility]. IV removed. IV site WNL. Pt left unit in stretcher with transportation team.

## 2023-10-19 NOTE — TOC Transition Note (Signed)
 Transition of Care [facility] [place]) - Discharge Note   Patient Details  Name: Mike Townsend MRN: [id number] Date of Birth: 05-15-1945  Transition of Care [facility]) CM/SW Contact:  Terrace Fontanilla C Salar Molden, RN Phone Number: 10/19/2023, 1:26 PM   Clinical Narrative:    [name] with [name] in admissions at [facility]  Per facility patient admission confirmed for today. Patient assigned room # 507 Nurse will call report to [phone] Face sheet and medical necessity forms printed to the floor to be added to the EMS pack EMS arranged 5:00.  Discharge summary and SNF transfer report sent in HUB.  Nurse notified with details  TOC signing off.          Patient Goals and CMS Choice            Discharge Placement                       Discharge Plan and Services Additional resources added to the After Visit Summary for                                       Social Drivers of Health (SDOH) Interventions SDOH Screenings   Food Insecurity: No Food Insecurity (10/13/2023)  Housing: Unknown (10/13/2023)  Transportation Needs: No Transportation Needs (10/13/2023)  Utilities: Not At Risk (10/13/2023)  Financial Resource Strain: Low Risk  (04/25/2023)   Received from [facility]  Social Connections: Moderately Integrated (10/13/2023)  Tobacco Use: Medium Risk (10/19/2023)     Readmission Risk Interventions     No data to display

## 2023-10-19 NOTE — Discharge Summary (Signed)
 Physician Discharge Summary  Kartik Fernando ZOX:[id number] DOB: 21-Apr-1946 DOA: 10/13/2023  PCP: [name], MD  Admit date: 10/13/2023 Discharge date: 10/19/2023  Admitted From: home  Disposition:  SNF  Recommendations for Outpatient Follow-up:  Follow up with PCP in 1-2 weeks F/u w/ cardio at [facility] in 1-2 weeks F/u w/ podiatry, Dr. Rosemarie Conquest in 2 weeks   Home Health: no  Equipment/Devices:  Discharge Condition: stable  CODE STATUS: full  Diet recommendation: Heart Healthy    Brief/Interim Summary: HPI was taken from Dr. [name]: Mike Townsend is a 78 y.o. male with medical history significant for sick sinus s/p pacemaker, a fib, cad, dchf, htn, ckd 3b, presenting with the above.   Chronic problem, happens from time to time, but happened twice in past two days, fell and hurt toes and knees and left hip today. Always happens when standing, today shortly after getting out of bed, yesterday while standing urinating. Gets lightheaded. No chest pain or palpitations. No recent illness, no fever, nausea, vomiting, or diarrhea. No head injury.   Discharge Diagnoses:  Principal Problem:   Syncope Active Problems:   Sick sinus syndrome (HCC)   Bradycardia   NSTEMI (non-ST elevated myocardial infarction) (HCC)   Coronary artery disease due to lipid rich plaque   Dilated cardiomyopathy (HCC)   Essential hypertension   Paroxysmal atrial fibrillation (HCC)   Stage 3b chronic kidney disease (HCC)   SSS (sick sinus syndrome) (HCC)   Hypothyroidism   Closed dislocation of second toe of right foot  Syncope: likely secondary to orthostatic hypotension. Chronic issue but happening more frequently. Echo was done evidently last month as per pt. Unremarkable pacemaker interrogation. Holding home metoprolol , losartan . Continue w/ TED hose, abd binder.   Appreciate cardiology recs, advise holding BB. We are also trying TED hose and abdominal binder. CK is WNL    Right 5th phalanx proximal fracture:  & dorsal dislocation of right second proximal phalanx. Continue w/ buddy tape of toe. F/u outpatient w/ ortho surg, Dr. Rosemarie Conquest, in 2 weeks. WBAT in post op shoe to right foot as per podiatry     Left gluteal hematoma: as per CT. S/p TXA. Holding eliquis . Continue w/ supportive care   Blood loss anemia: likely secondary to above hematoma. Holding eliquis . Will continue to monitor H&H   Hyponatremia: trending up. Continue w/ salt tab. 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    HTN: BP is WNL currently. D/c home coreg , losartan      PAF: continue on home dose of amio and holding eliquis . High fall risk. Hold eliquis  at d/c and f/u outpatient w/ cardio to decide when and if to restart    Sick sinus: s/p pacemaker. Cardiology says pacemaker interrogated, no issues.   Hypothyroidism: continue on home dose of synthroid     Chronic systolic CHF: echo in 2020 shows EF 40-45%, normal diastolic function. Appears compensated    CKDIIIa: Cr is trending down from day prior. Avoid nephrotoxic meds.    Macrocytic anemia: B12, folate WNL. Smear unremarkable. Will continue to monitor    Thrombocytopenia: resolved   Constipation: resolved    Obesity: BMI 30.1. Would benefit from weight loss   Discharge Instructions  Discharge Instructions     Diet general   Complete by: As directed    Discharge instructions   Complete by: As directed    F/u w/ [facility] cardio in 1-2 weeks. F/u w/ PCP in 1-2 weeks   Increase activity slowly   Complete by:  As directed       Allergies as of 10/19/2023   No Known Allergies      Medication List     PAUSE taking these medications    Eliquis  5 MG Tabs tablet Wait to take this until: November 02, 2023 Generic drug: apixaban  Take 5 mg by mouth 2 (two) times a day.       STOP taking these medications    carvedilol  3.125 MG tablet Commonly known as: COREG    losartan  25 MG tablet Commonly known as: COZAAR    metoprolol  succinate 50 MG 24 hr  tablet Commonly known as: TOPROL -XL       TAKE these medications    acetaminophen  325 MG tablet Commonly known as: TYLENOL  Take 2 tablets (650 mg total) by mouth every 6 (six) hours as needed for mild pain or moderate pain (or Fever >/= 101).   allopurinol  300 MG tablet Commonly known as: ZYLOPRIM  Take 300 mg by mouth daily.   amiodarone  200 MG tablet Commonly known as: PACERONE  Take 100 mg by mouth daily.   levothyroxine  75 MCG tablet Commonly known as: SYNTHROID  Take 75 mcg by mouth daily before breakfast.   magnesium  oxide 400 MG tablet Commonly known as: MAG-OX Take 400 mg by mouth daily.   oxyCODONE  5 MG immediate release tablet Commonly known as: Oxy IR/ROXICODONE  Take 1 tablet (5 mg total) by mouth every 6 (six) hours as needed for up to 3 days for moderate pain (pain score 4-6) or severe pain (pain score 7-10).   rosuvastatin  20 MG tablet Commonly known as: CRESTOR  Take 20 mg by mouth daily.   SAW PALMETTO  PO Take 1 capsule by mouth daily.   sodium chloride  1 g tablet Take 1 tablet (1 g total) by mouth 3 (three) times daily with meals.        Follow-up Information     Laury Portela, Durand Gift, MD. Go in 1 week(s).   Specialty: Cardiology Contact information: [address] [facility] [address] [facility] [address] [place] [address] [phone]                No Known Allergies  Consultations: Podiatry    Procedures/Studies: CT CHEST WO CONTRAST Result Date: 10/15/2023 CLINICAL DATA:  Hyponatremia, SIADH, rule out malignancy * Tracking Code: BO * EXAM: CT CHEST WITHOUT CONTRAST TECHNIQUE: Multidetector CT imaging of the chest was performed following the standard protocol without IV contrast. RADIATION DOSE REDUCTION: This exam was performed according to the departmental dose-optimization program which includes automated exposure control, adjustment of the mA and/or kV according to patient size and/or use of iterative reconstruction technique.  COMPARISON:  None Available. FINDINGS: Cardiovascular: Aortic atherosclerosis. Left chest multi lead pacer. Normal heart size. Extensive three-vessel coronary artery calcifications. No pericardial effusion. Mediastinum/Nodes: Small, densely calcified benign granulomatous subcarinal and left hilar lymph nodes, sequelae of prior infection or inflammation. No enlarged mediastinal, hilar, or axillary lymph nodes. Thyroid gland, trachea, and esophagus demonstrate no significant findings. Lungs/Pleura: Bibasilar scarring or atelectasis with elevation of the right hemidiaphragm. No pleural effusion or pneumothorax. Upper Abdomen: No acute abnormality. Musculoskeletal: No chest wall abnormality. No acute osseous findings. IMPRESSION: 1. No noncontrast CT evidence of malignancy in the chest. 2. Bibasilar scarring or atelectasis with elevation of the right hemidiaphragm. 3. Benign calcific granulomatous lymph nodes. 4. Coronary artery disease. Aortic Atherosclerosis (ICD10-I70.0). Electronically Signed   By: Fredricka Jenny M.D.   On: 10/15/2023 13:47   CT PELVIS WO CONTRAST Result Date: 10/14/2023 CLINICAL DATA:  Fall and left hip pain. EXAM: CT PELVIS WITHOUT CONTRAST TECHNIQUE: Multidetector CT imaging of the pelvis was performed following the standard protocol without intravenous contrast. RADIATION DOSE REDUCTION: This exam was performed according to the departmental dose-optimization program which includes automated exposure control, adjustment of the mA and/or kV according to patient size and/or use of iterative reconstruction technique. COMPARISON:  Left hip radiograph dated 10/13/2023. FINDINGS: Urinary Tract:  No abnormality visualized. Bowel:  Unremarkable visualized pelvic bowel loops. Vascular/Lymphatic: Advanced aortoiliac atherosclerotic disease. The IVC is unremarkable no pelvic adenopathy. Reproductive: The prostate and seminal vesicles are grossly remarkable. Other:  None Musculoskeletal: No acute fracture  or dislocation. The bones are osteopenic. Mild bilateral hip arthritic changes. Left gluteal intramuscular hematoma. There is subcutaneous contusion of the left buttock. No drainable fluid collection. IMPRESSION: 1. No acute fracture or dislocation. 2. Left buttock subcutaneous contusion and left gluteal intramuscular hematoma. 3.  Aortic Atherosclerosis (ICD10-I70.0). Electronically Signed   By: Angus Bark M.D.   On: 10/14/2023 13:20   DG Foot Complete Right Result Date: 10/13/2023 CLINICAL DATA:  Fall, right foot EXAM: RIGHT FOOT COMPLETE - 3+ VIEW COMPARISON:  None Available. FINDINGS: There is an acute fracture of the proximal diaphysis of the right fifth proximal phalanx with fracture fragments in near anatomic alignment. No other fracture. Dorsal lateral dislocation of the right second proximal phalanx. Remaining joint spaces are preserved. Small plantar calcaneal spur. Vascular calcifications. Mild soft tissue swelling of the dorsal right forefoot. IMPRESSION: 1. Acute fracture of the right fifth proximal phalanx. 2. Dorsal lateral dislocation of the right second proximal phalanx. Electronically Signed   By: Worthy Heads M.D.   On: 10/13/2023 04:44   DG Knee Complete 4 Views Right Result Date: 10/13/2023 CLINICAL DATA:  Fall, right knee pain EXAM: RIGHT KNEE - COMPLETE 4+ VIEW COMPARISON:  None Available. FINDINGS: Normal alignment. No acute fracture or dislocation. Mild tricompartmental degenerative arthritis, most severe within the patellofemoral compartment. Small right knee effusion. Vascular calcifications are noted. IMPRESSION: 1. Mild tricompartmental degenerative arthritis. Small right knee effusion. Electronically Signed   By: Worthy Heads M.D.   On: 10/13/2023 04:42   DG Knee Complete 4 Views Left Result Date: 10/13/2023 CLINICAL DATA:  Fall EXAM: LEFT KNEE - COMPLETE 4+ VIEW COMPARISON:  None Available. FINDINGS: Normal alignment. No acute fracture or dislocation. Mild  tricompartmental degenerative arthritis, most severe within the patellofemoral compartment. Small effusion. Vascular calcifications are noted. Varicoid densities within the subcutaneous soft tissues are in keeping with venous varicosities. IMPRESSION: 1. Mild tricompartmental degenerative arthritis. Small effusion. Electronically Signed   By: Worthy Heads M.D.   On: 10/13/2023 04:42   DG Hip Unilat W or Wo Pelvis 2-3 Views Left Result Date: 10/13/2023 CLINICAL DATA:  Fall EXAM: DG HIP (WITH OR WITHOUT PELVIS) 2-3V LEFT COMPARISON:  None Available. FINDINGS: Normal alignment. No acute fracture or dislocation. Mild left hip degenerative arthritis. Vascular calcifications are noted. IMPRESSION: 1. Mild left hip degenerative arthritis. Electronically Signed   By: Worthy Heads M.D.   On: 10/13/2023 04:25   CT Head Wo Contrast Result Date: 10/13/2023 EXAM: CT HEAD AND CERVICAL SPINE 10/13/2023 03:56:49 AM TECHNIQUE: CT of the head and cervical spine was performed without the administration of intravenous contrast. Multiplanar reformatted images are provided for review. Automated exposure control, iterative reconstruction, and/or weight based adjustment of the mA/kV was utilized to reduce the radiation dose to as low as reasonably achievable. COMPARISON: None available. CLINICAL HISTORY: Head trauma, minor (Age >= 65y).  Patient C/O three falls in the last twenty four hours. Patient states that twice the falls were syncopal episodes (he does have orthostatic hypotension), and one was a mechanical fall where he tripped. He does endorse hitting his head during one of the syncopal episodes, and is on eliquis . Patient is alert and oriented x4. He is now C/O left hip pain and bilateral knee pain from the most recent fall (around 0130 this morning). Also of note, patient has a pacemaker and he wonders if his episodes have anything to do with this. FINDINGS: HEAD: BRAIN AND VENTRICLES: There is no acute intracranial  hemorrhage, mass effect or midline shift. No abnormal extra-axial fluid collection. The gray-white differentiation is maintained without an acute infarct. There is no hydrocephalus. Global cortical atrophy. Subcortical and periventricular small vessel ischemic changes. Intracranial atherosclerosis. ORBITS: The visualized portion of the orbits demonstrate no acute abnormality. SINUSES: The visualized paranasal sinuses and mastoid air cells demonstrate no acute abnormality. SOFT TISSUES AND SKULL: No acute abnormality of the visualized skull or soft tissues. CERVICAL SPINE: BONES AND ALIGNMENT: There is no acute fracture or traumatic malalignment. DEGENERATIVE CHANGES: Mild degenerative changes at C5-6 and C6-7. SOFT TISSUES: There is no prevertebral soft tissue swelling. IMPRESSION: 1. No acute intracranial abnormality. 2. No acute fracture in the cervical spine. Electronically signed by: Zadie Herter MD 10/13/2023 04:01 AM EDT RP Workstation: [id number]   CT Cervical Spine Wo Contrast Result Date: 10/13/2023 EXAM: CT HEAD AND CERVICAL SPINE 10/13/2023 03:56:49 AM TECHNIQUE: CT of the head and cervical spine was performed without the administration of intravenous contrast. Multiplanar reformatted images are provided for review. Automated exposure control, iterative reconstruction, and/or weight based adjustment of the mA/kV was utilized to reduce the radiation dose to as low as reasonably achievable. COMPARISON: None available. CLINICAL HISTORY: Head trauma, minor (Age >= 65y). Patient C/O three falls in the last twenty four hours. Patient states that twice the falls were syncopal episodes (he does have orthostatic hypotension), and one was a mechanical fall where he tripped. He does endorse hitting his head during one of the syncopal episodes, and is on eliquis . Patient is alert and oriented x4. He is now C/O left hip pain and bilateral knee pain from the most recent fall (around 0130 this morning). Also of  note, patient has a pacemaker and he wonders if his episodes have anything to do with this. FINDINGS: HEAD: BRAIN AND VENTRICLES: There is no acute intracranial hemorrhage, mass effect or midline shift. No abnormal extra-axial fluid collection. The gray-white differentiation is maintained without an acute infarct. There is no hydrocephalus. Global cortical atrophy. Subcortical and periventricular small vessel ischemic changes. Intracranial atherosclerosis. ORBITS: The visualized portion of the orbits demonstrate no acute abnormality. SINUSES: The visualized paranasal sinuses and mastoid air cells demonstrate no acute abnormality. SOFT TISSUES AND SKULL: No acute abnormality of the visualized skull or soft tissues. CERVICAL SPINE: BONES AND ALIGNMENT: There is no acute fracture or traumatic malalignment. DEGENERATIVE CHANGES: Mild degenerative changes at C5-6 and C6-7. SOFT TISSUES: There is no prevertebral soft tissue swelling. IMPRESSION: 1. No acute intracranial abnormality. 2. No acute fracture in the cervical spine. Electronically signed by: Zadie Herter MD 10/13/2023 04:01 AM EDT RP Workstation: [id number]   (Echo, Carotid, EGD, Colonoscopy, ERCP)    Subjective:   Discharge Exam: Vitals:   10/19/23 1118 10/19/23 1121  BP: 116/69 (!) 101/57  Pulse: 64 91  Resp: 18   Temp:    SpO2: 100% 92%   Vitals:  10/19/23 0358 10/19/23 0758 10/19/23 1118 10/19/23 1121  BP: (!) 140/80 105/70 116/69 (!) 101/57  Pulse: 66 60 64 91  Resp: 19 17 18    Temp: 98.2 F (36.8 C) 97.8 F (36.6 C)    TempSrc:      SpO2: 99% 95% 100% 92%  Weight:      Height:        General: Pt is alert, awake, not in acute distress Cardiovascular: S1/S2 +, no rubs, no gallops Respiratory: CTA bilaterally, no wheezing, no rhonchi Abdominal: Soft, NT,obese, bowel sounds + Extremities: no cyanosis    The results of significant diagnostics from this hospitalization (including imaging, microbiology, ancillary and  laboratory) are listed below for reference.     Microbiology: No results found for this or any previous visit (from the past 240 hours).   Labs: BNP (last 3 results) Recent Labs    10/15/23 0421  BNP 88.2   Basic Metabolic Panel: Recent Labs  Lab 10/13/23 0559 10/14/23 0511 10/15/23 0426 10/16/23 0418 10/17/23 0623 10/18/23 0415 10/19/23 0503  NA 129*   < > 125* 128* 131* 132* 133*  K 4.2   < > 4.3 4.3 4.1 4.2 4.4  CL 99   < > 97* 99 100 103 103  CO2 23   < > 22 21* 25 23 22   GLUCOSE 117*   < > 106* 104* 99 105* 102*  BUN 29*   < > 21 21 24* 25* 23  CREATININE 1.56*   < > 1.42* 1.37* 1.42* 1.39* 1.28*  CALCIUM  8.4*   < > 8.1* 8.2* 8.4* 8.3* 8.4*  MG 2.0  --   --   --   --   --   --    < > = values in this interval not displayed.   Liver Function Tests: Recent Labs  Lab 10/13/23 0559  AST 22  ALT 22  ALKPHOS 56  BILITOT 1.3*  PROT 6.1*  ALBUMIN  3.5   No results for input(s): LIPASE, AMYLASE in the last 168 hours. No results for input(s): AMMONIA in the last 168 hours. CBC: Recent Labs  Lab 10/13/23 0330 10/13/23 0855 10/14/23 0511 10/15/23 0426 10/16/23 0418 10/17/23 0623 10/18/23 0415 10/19/23 0503  WBC 7.2 7.0   < > 8.0 7.5 6.4 6.6 6.1  NEUTROABS 5.5 5.3  --   --   --   --   --   --   HGB 12.6* 10.9*   < > 9.5* 9.0* 9.1* 8.9* 9.2*  HCT 36.7* 31.6*   < > 27.5* 25.8* 26.3* 25.4* 26.9*  MCV 112.9* 110.9*   < > 110.0* 109.8* 111.9* 110.9* 111.6*  PLT 131* 124*   < > 115* 124* 148* 167 179   < > = values in this interval not displayed.   Cardiac Enzymes: Recent Labs  Lab 10/17/23 1629  CKTOTAL 91   BNP: Invalid input(s): POCBNP CBG: No results for input(s): GLUCAP in the last 168 hours. D-Dimer No results for input(s): DDIMER in the last 72 hours. Hgb A1c No results for input(s): HGBA1C in the last 72 hours. Lipid Profile No results for input(s): CHOL, HDL, LDLCALC, TRIG, CHOLHDL, LDLDIRECT in the last 72  hours. Thyroid function studies No results for input(s): TSH, T4TOTAL, T3FREE, THYROIDAB in the last 72 hours.  Invalid input(s): FREET3 Anemia work up No results for input(s): VITAMINB12, FOLATE, FERRITIN, TIBC, IRON, RETICCTPCT in the last 72 hours. Urinalysis    Component Value Date/Time   COLORURINE YELLOW (  A) 10/13/2023 0833   APPEARANCEUR CLEAR (A) 10/13/2023 0833   LABSPEC 1.018 10/13/2023 0833   PHURINE 6.0 10/13/2023 0833   GLUCOSEU NEGATIVE 10/13/2023 0833   HGBUR NEGATIVE 10/13/2023 0833   BILIRUBINUR NEGATIVE 10/13/2023 0833   KETONESUR NEGATIVE 10/13/2023 0833   PROTEINUR NEGATIVE 10/13/2023 0833   NITRITE NEGATIVE 10/13/2023 0833   LEUKOCYTESUR NEGATIVE 10/13/2023 0833   Sepsis Labs Recent Labs  Lab 10/16/23 0418 10/17/23 0623 10/18/23 0415 10/19/23 0503  WBC 7.5 6.4 6.6 6.1   Microbiology No results found for this or any previous visit (from the past 240 hours).   Time coordinating discharge: Over 30 minutes  SIGNED:   Alphonsus Jeans, MD  [facility] 10/19/2023, 1:16 PM Pager   If 7PM-7AM, please contact night-coverage [email or web address]

## 2023-10-19 NOTE — Progress Notes (Signed)
 Physical Therapy Treatment Patient Details Name: Mike Townsend MRN: [id number] DOB: [birth year] Today's Date: 10/19/2023   History of Present Illness Pt is a 78 year old presenting with syncope, imaging also found R 5th phalanx proximal fracture, Dorsal dislocation right second proximal phalanx    PMH significant for sick sinus s/p pacemaker, a fib, cad, dchf, htn, ckd 3b    PT Comments  Pt is A and O x 4. Eager for OOB to recliner prior to breakfast. BP in bed prior to OOB activity 130/73(89). Pt had TED stocking and abdominal binder donned throughout night and past 48 hours. Pt required less assistance to achieve EOB sitting from long sitting in bed position. Heavy use of bed rails (HOB elevated) however no actual physical assistance. Pt sat EOB with BP continuously dropping. C/O posterior neck pain but no dizziness. He did stand 3 x EOB from elevated heights. Limited standing tolerance due to fatigue/drop in BP. Lowest BP reading in standing 63/35(46). As soon as BLEs elevated in recliner BP 104/54(69). Symptoms quickly resolve once LEs elevated. Pt remains extremely motivated and optimistic orthostatic hypotension will improve with time.  DC recs remain appropriate to maximize independence and safety with all ADLs.    If plan is discharge home, recommend the following: A little help with walking and/or transfers;A little help with bathing/dressing/bathroom;Assistance with cooking/housework;Assist for transportation;Help with stairs or ramp for entrance     Equipment Recommendations  Rolling walker (2 wheels)       Precautions / Restrictions Precautions Precautions: Fall Recall of Precautions/Restrictions: Intact Precaution/Restrictions Comments: TED stocking + abdominal binder Restrictions Weight Bearing Restrictions Per Provider Order: Yes RLE Weight Bearing Per Provider Order: Weight bearing as tolerated     Mobility  Bed Mobility Overal bed mobility: Needs Assistance Bed Mobility:  Supine to Sit  Sit to supine: Supervision, HOB elevated General bed mobility comments: Pt was able to progress from long sitting in bed to short sitting EOB without physical assistance. Heavy use of bedrails    Transfers Overall transfer level: Needs assistance Equipment used: Rolling walker (2 wheels) Transfers: Sit to/from Stand Sit to Stand: Min assist, From elevated surface  General transfer comment: Pt was able to stand from elevated bed height 3 x with author holding down RW to prevent tiping. Pt requested to stand from elevated surface heights but was able to stand from progressively lowered height each time.    Ambulation/Gait Ambulation/Gait assistance: Min assist Gait Distance (Feet): 3 Feet Assistive device: Rolling walker (2 wheels) Gait Pattern/deviations: Step-to pattern, Trunk flexed Gait velocity: decreased  General Gait Details: pt was easily able to take steps to recliner however has cold sweats and poor activity tolerance due to sever drop in BP. BP dropped as low 63/35(46) but quickly recovers to 104/54 (69) once BLEs are elevated.   Balance Overall balance assessment: Needs assistance Sitting-balance support: Feet supported Sitting balance-Leahy Scale: Good Sitting balance - Comments: Does lean on RW in sitting and does endorse posterior neck discomfort   Standing balance support: Bilateral upper extremity supported, During functional activity, Reliant on assistive device for balance Standing balance-Leahy Scale: Fair Standing balance comment: Pt is completely reliant on RW during standing       Communication Communication Communication: No apparent difficulties  Cognition Arousal: Alert Behavior During Therapy: WFL for tasks assessed/performed   PT - Cognitive impairments: No apparent impairments      PT - Cognition Comments: pt is A and O x 4. Extremely pleasant and motivated Following commands:  Intact      Cueing Cueing Techniques: Tactile cues   Exercises Other Exercises Other Exercises: bilateral TED hose+ abdominal binder in place throughout session and remains donned post session        Pertinent Vitals/Pain Pain Assessment Pain Assessment: No/denies pain Pain Score: 0-No pain Pain Location:  (L knee/ broken toe in wt bearing) Pain Descriptors / Indicators: Discomfort, Sore Pain Intervention(s): Premedicated before session, Limited activity within patient's tolerance, Monitored during session, Repositioned     PT Goals (current goals can now be found in the care plan section) Acute Rehab PT Goals Patient Stated Goal: to go home after rehab Progress towards PT goals: Progressing toward goals    Frequency    Min 2X/week           Co-evaluation     PT goals addressed during session: Mobility/safety with mobility;Balance;Proper use of DME;Strengthening/ROM        AM-PAC PT 6 Clicks Mobility   Outcome Measure  Help needed turning from your back to your side while in a flat bed without using bedrails?: A Little Help needed moving from lying on your back to sitting on the side of a flat bed without using bedrails?: A Little Help needed moving to and from a bed to a chair (including a wheelchair)?: A Little Help needed standing up from a chair using your arms (e.g., wheelchair or bedside chair)?: A Lot Help needed to walk in hospital room?: A Little Help needed climbing 3-5 steps with a railing? : A Lot 6 Click Score: 16    End of Session   Activity Tolerance: Patient tolerated treatment well Patient left: in chair;with call bell/phone within reach;with chair alarm set Nurse Communication: Mobility status PT Visit Diagnosis: Unsteadiness on feet (R26.81);Muscle weakness (generalized) (M62.81)     Time: 2130-8657 PT Time Calculation (min) (ACUTE ONLY): 25 min  Charges:    $Therapeutic Activity: 23-37 mins PT General Charges $$ ACUTE PT VISIT: 1 Visit                    Chester Costa  PTA 10/19/23, 8:26 AM

## 2023-11-02 ENCOUNTER — Ambulatory Visit: Admitting: Podiatry
